# Patient Record
Sex: Female | Born: 1935 | Race: White | Hispanic: No | Smoking: Former smoker
Health system: Southern US, Community
[De-identification: ages and names within clinical notes are randomized; demographics above are authoritative.]

## PROBLEM LIST (undated history)

## (undated) DIAGNOSIS — R351 Nocturia: Secondary | ICD-10-CM

## (undated) DIAGNOSIS — M1990 Unspecified osteoarthritis, unspecified site: Secondary | ICD-10-CM

## (undated) DIAGNOSIS — N952 Postmenopausal atrophic vaginitis: Secondary | ICD-10-CM

## (undated) DIAGNOSIS — N6012 Diffuse cystic mastopathy of left breast: Secondary | ICD-10-CM

## (undated) DIAGNOSIS — R609 Edema, unspecified: Secondary | ICD-10-CM

## (undated) DIAGNOSIS — Z8719 Personal history of other diseases of the digestive system: Secondary | ICD-10-CM

## (undated) DIAGNOSIS — J45909 Unspecified asthma, uncomplicated: Secondary | ICD-10-CM

## (undated) DIAGNOSIS — G8929 Other chronic pain: Secondary | ICD-10-CM

## (undated) DIAGNOSIS — F329 Major depressive disorder, single episode, unspecified: Secondary | ICD-10-CM

## (undated) DIAGNOSIS — I739 Peripheral vascular disease, unspecified: Secondary | ICD-10-CM

## (undated) DIAGNOSIS — E785 Hyperlipidemia, unspecified: Secondary | ICD-10-CM

## (undated) DIAGNOSIS — F419 Anxiety disorder, unspecified: Secondary | ICD-10-CM

## (undated) DIAGNOSIS — I219 Acute myocardial infarction, unspecified: Secondary | ICD-10-CM

## (undated) DIAGNOSIS — N6011 Diffuse cystic mastopathy of right breast: Secondary | ICD-10-CM

## (undated) DIAGNOSIS — E039 Hypothyroidism, unspecified: Secondary | ICD-10-CM

## (undated) DIAGNOSIS — N200 Calculus of kidney: Secondary | ICD-10-CM

## (undated) DIAGNOSIS — G629 Polyneuropathy, unspecified: Secondary | ICD-10-CM

## (undated) DIAGNOSIS — F32A Depression, unspecified: Secondary | ICD-10-CM

## (undated) DIAGNOSIS — I252 Old myocardial infarction: Secondary | ICD-10-CM

## (undated) DIAGNOSIS — N189 Chronic kidney disease, unspecified: Secondary | ICD-10-CM

## (undated) DIAGNOSIS — R079 Chest pain, unspecified: Secondary | ICD-10-CM

## (undated) DIAGNOSIS — J449 Chronic obstructive pulmonary disease, unspecified: Secondary | ICD-10-CM

## (undated) DIAGNOSIS — L905 Scar conditions and fibrosis of skin: Secondary | ICD-10-CM

## (undated) DIAGNOSIS — N3289 Other specified disorders of bladder: Secondary | ICD-10-CM

## (undated) DIAGNOSIS — J9610 Chronic respiratory failure, unspecified whether with hypoxia or hypercapnia: Secondary | ICD-10-CM

## (undated) DIAGNOSIS — N183 Chronic kidney disease, stage 3 unspecified: Secondary | ICD-10-CM

## (undated) DIAGNOSIS — I10 Essential (primary) hypertension: Secondary | ICD-10-CM

## (undated) DIAGNOSIS — E7800 Pure hypercholesterolemia, unspecified: Secondary | ICD-10-CM

## (undated) DIAGNOSIS — E119 Type 2 diabetes mellitus without complications: Secondary | ICD-10-CM

## (undated) DIAGNOSIS — K219 Gastro-esophageal reflux disease without esophagitis: Secondary | ICD-10-CM

## (undated) DIAGNOSIS — D649 Anemia, unspecified: Secondary | ICD-10-CM

## (undated) DIAGNOSIS — I4891 Unspecified atrial fibrillation: Secondary | ICD-10-CM

## (undated) DIAGNOSIS — E1122 Type 2 diabetes mellitus with diabetic chronic kidney disease: Secondary | ICD-10-CM

## (undated) DIAGNOSIS — C801 Malignant (primary) neoplasm, unspecified: Secondary | ICD-10-CM

## (undated) DIAGNOSIS — E559 Vitamin D deficiency, unspecified: Secondary | ICD-10-CM

## (undated) DIAGNOSIS — I2510 Atherosclerotic heart disease of native coronary artery without angina pectoris: Secondary | ICD-10-CM

## (undated) DIAGNOSIS — I208 Other forms of angina pectoris: Secondary | ICD-10-CM

## (undated) HISTORY — DX: Diffuse cystic mastopathy of right breast: N60.11

## (undated) HISTORY — PX: BREAST SURGERY: SHX581

## (undated) HISTORY — DX: Major depressive disorder, single episode, unspecified: F32.9

## (undated) HISTORY — DX: Chest pain, unspecified: R07.9

## (undated) HISTORY — DX: Unspecified osteoarthritis, unspecified site: M19.90

## (undated) HISTORY — DX: Type 2 diabetes mellitus with diabetic chronic kidney disease: E11.22

## (undated) HISTORY — DX: Morbid (severe) obesity due to excess calories: E66.01

## (undated) HISTORY — PX: CATARACT EXTRACTION: SUR2

## (undated) HISTORY — DX: Personal history of other diseases of the digestive system: Z87.19

## (undated) HISTORY — DX: Vitamin D deficiency, unspecified: E55.9

## (undated) HISTORY — DX: Other specified disorders of bladder: N32.89

## (undated) HISTORY — DX: Diffuse cystic mastopathy of left breast: N60.12

## (undated) HISTORY — DX: Pure hypercholesterolemia, unspecified: E78.00

## (undated) HISTORY — DX: Type 2 diabetes mellitus without complications: E11.9

## (undated) HISTORY — DX: Other chronic pain: G89.29

## (undated) HISTORY — DX: Other forms of angina pectoris: I20.8

## (undated) HISTORY — DX: Gastro-esophageal reflux disease without esophagitis: K21.9

## (undated) HISTORY — DX: Unspecified asthma, uncomplicated: J45.909

## (undated) HISTORY — DX: Hypothyroidism, unspecified: E03.9

## (undated) HISTORY — DX: Essential (primary) hypertension: I10

## (undated) HISTORY — DX: Chronic respiratory failure, unspecified whether with hypoxia or hypercapnia: J96.10

## (undated) HISTORY — DX: Scar conditions and fibrosis of skin: L90.5

## (undated) HISTORY — DX: Old myocardial infarction: I25.2

## (undated) HISTORY — DX: Chronic kidney disease, stage 3 (moderate): N18.3

## (undated) HISTORY — PX: OTHER SURGICAL HISTORY: SHX169

## (undated) HISTORY — DX: Malignant (primary) neoplasm, unspecified: C80.1

## (undated) HISTORY — DX: Anxiety disorder, unspecified: F41.9

## (undated) HISTORY — DX: Edema, unspecified: R60.9

## (undated) HISTORY — DX: Depression, unspecified: F32.A

## (undated) HISTORY — DX: Acute myocardial infarction, unspecified: I21.9

## (undated) HISTORY — DX: Chronic obstructive pulmonary disease, unspecified: J44.9

## (undated) HISTORY — DX: Atherosclerotic heart disease of native coronary artery without angina pectoris: I25.10

## (undated) HISTORY — DX: Nocturia: R35.1

## (undated) HISTORY — DX: Hyperlipidemia, unspecified: E78.5

## (undated) HISTORY — DX: Unspecified atrial fibrillation: I48.91

## (undated) HISTORY — DX: Chronic kidney disease, stage 3 unspecified: N18.30

## (undated) HISTORY — DX: Postmenopausal atrophic vaginitis: N95.2

## (undated) HISTORY — DX: Anemia, unspecified: D64.9

## (undated) HISTORY — DX: Polyneuropathy, unspecified: G62.9

## (undated) HISTORY — DX: Peripheral vascular disease, unspecified: I73.9

## (undated) HISTORY — DX: Calculus of kidney: N20.0

## (undated) HISTORY — DX: Chronic kidney disease, unspecified: N18.9

---
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
## 1978-02-25 HISTORY — PX: CHOLECYSTECTOMY: SHX55

## 1983-02-26 HISTORY — PX: HERNIA REPAIR: SHX51

## 2000-02-26 HISTORY — PX: ARTHROPLASTY: SHX135

## 2000-05-10 ENCOUNTER — Inpatient Hospital Stay (HOSPITAL_COMMUNITY): Admission: EM | Admit: 2000-05-10 | Discharge: 2000-05-13 | Payer: Self-pay | Admitting: Emergency Medicine

## 2005-02-25 HISTORY — PX: CARPAL TUNNEL RELEASE: SHX101

## 2011-03-04 DIAGNOSIS — Z79899 Other long term (current) drug therapy: Secondary | ICD-10-CM | POA: Diagnosis not present

## 2011-03-04 DIAGNOSIS — E119 Type 2 diabetes mellitus without complications: Secondary | ICD-10-CM | POA: Diagnosis not present

## 2011-03-04 DIAGNOSIS — D510 Vitamin B12 deficiency anemia due to intrinsic factor deficiency: Secondary | ICD-10-CM | POA: Diagnosis not present

## 2011-03-04 DIAGNOSIS — E782 Mixed hyperlipidemia: Secondary | ICD-10-CM | POA: Diagnosis not present

## 2011-03-07 DIAGNOSIS — E1149 Type 2 diabetes mellitus with other diabetic neurological complication: Secondary | ICD-10-CM | POA: Diagnosis not present

## 2011-03-08 DIAGNOSIS — Z23 Encounter for immunization: Secondary | ICD-10-CM | POA: Diagnosis not present

## 2011-03-08 DIAGNOSIS — E1159 Type 2 diabetes mellitus with other circulatory complications: Secondary | ICD-10-CM | POA: Diagnosis not present

## 2011-03-08 DIAGNOSIS — Z780 Asymptomatic menopausal state: Secondary | ICD-10-CM | POA: Diagnosis not present

## 2011-03-08 DIAGNOSIS — M949 Disorder of cartilage, unspecified: Secondary | ICD-10-CM | POA: Diagnosis not present

## 2011-03-08 DIAGNOSIS — M899 Disorder of bone, unspecified: Secondary | ICD-10-CM | POA: Diagnosis not present

## 2011-03-11 DIAGNOSIS — J45909 Unspecified asthma, uncomplicated: Secondary | ICD-10-CM | POA: Diagnosis not present

## 2011-03-11 DIAGNOSIS — G4730 Sleep apnea, unspecified: Secondary | ICD-10-CM | POA: Diagnosis not present

## 2011-03-11 DIAGNOSIS — R5381 Other malaise: Secondary | ICD-10-CM | POA: Diagnosis not present

## 2011-03-11 DIAGNOSIS — J309 Allergic rhinitis, unspecified: Secondary | ICD-10-CM | POA: Diagnosis not present

## 2011-03-13 DIAGNOSIS — G4730 Sleep apnea, unspecified: Secondary | ICD-10-CM | POA: Diagnosis not present

## 2011-03-13 DIAGNOSIS — G4710 Hypersomnia, unspecified: Secondary | ICD-10-CM | POA: Diagnosis not present

## 2011-03-27 DIAGNOSIS — N200 Calculus of kidney: Secondary | ICD-10-CM | POA: Diagnosis not present

## 2011-03-27 DIAGNOSIS — N3090 Cystitis, unspecified without hematuria: Secondary | ICD-10-CM | POA: Diagnosis not present

## 2011-03-27 DIAGNOSIS — N318 Other neuromuscular dysfunction of bladder: Secondary | ICD-10-CM | POA: Diagnosis not present

## 2011-03-28 DIAGNOSIS — L570 Actinic keratosis: Secondary | ICD-10-CM | POA: Diagnosis not present

## 2011-03-28 DIAGNOSIS — D485 Neoplasm of uncertain behavior of skin: Secondary | ICD-10-CM | POA: Diagnosis not present

## 2011-04-02 DIAGNOSIS — Z09 Encounter for follow-up examination after completed treatment for conditions other than malignant neoplasm: Secondary | ICD-10-CM | POA: Diagnosis not present

## 2011-04-02 DIAGNOSIS — H26499 Other secondary cataract, unspecified eye: Secondary | ICD-10-CM | POA: Diagnosis not present

## 2011-04-18 DIAGNOSIS — H26499 Other secondary cataract, unspecified eye: Secondary | ICD-10-CM | POA: Diagnosis not present

## 2011-05-23 DIAGNOSIS — N3090 Cystitis, unspecified without hematuria: Secondary | ICD-10-CM | POA: Diagnosis not present

## 2011-05-23 DIAGNOSIS — N318 Other neuromuscular dysfunction of bladder: Secondary | ICD-10-CM | POA: Diagnosis not present

## 2011-05-23 DIAGNOSIS — N200 Calculus of kidney: Secondary | ICD-10-CM | POA: Diagnosis not present

## 2011-05-28 DIAGNOSIS — E782 Mixed hyperlipidemia: Secondary | ICD-10-CM | POA: Diagnosis not present

## 2011-05-28 DIAGNOSIS — E1159 Type 2 diabetes mellitus with other circulatory complications: Secondary | ICD-10-CM | POA: Diagnosis not present

## 2011-05-28 DIAGNOSIS — D510 Vitamin B12 deficiency anemia due to intrinsic factor deficiency: Secondary | ICD-10-CM | POA: Diagnosis not present

## 2011-05-28 DIAGNOSIS — Z79899 Other long term (current) drug therapy: Secondary | ICD-10-CM | POA: Diagnosis not present

## 2011-05-28 DIAGNOSIS — E038 Other specified hypothyroidism: Secondary | ICD-10-CM | POA: Diagnosis not present

## 2011-05-29 DIAGNOSIS — E038 Other specified hypothyroidism: Secondary | ICD-10-CM | POA: Diagnosis not present

## 2011-05-29 DIAGNOSIS — E1159 Type 2 diabetes mellitus with other circulatory complications: Secondary | ICD-10-CM | POA: Diagnosis not present

## 2011-06-05 DIAGNOSIS — Z1231 Encounter for screening mammogram for malignant neoplasm of breast: Secondary | ICD-10-CM | POA: Diagnosis not present

## 2011-06-10 DIAGNOSIS — J309 Allergic rhinitis, unspecified: Secondary | ICD-10-CM | POA: Diagnosis not present

## 2011-06-10 DIAGNOSIS — G4730 Sleep apnea, unspecified: Secondary | ICD-10-CM | POA: Diagnosis not present

## 2011-06-10 DIAGNOSIS — J45909 Unspecified asthma, uncomplicated: Secondary | ICD-10-CM | POA: Diagnosis not present

## 2011-06-11 DIAGNOSIS — J45909 Unspecified asthma, uncomplicated: Secondary | ICD-10-CM | POA: Diagnosis not present

## 2011-06-11 DIAGNOSIS — G4710 Hypersomnia, unspecified: Secondary | ICD-10-CM | POA: Diagnosis not present

## 2011-06-11 DIAGNOSIS — J309 Allergic rhinitis, unspecified: Secondary | ICD-10-CM | POA: Diagnosis not present

## 2011-06-17 DIAGNOSIS — E669 Obesity, unspecified: Secondary | ICD-10-CM | POA: Diagnosis not present

## 2011-06-17 DIAGNOSIS — N6019 Diffuse cystic mastopathy of unspecified breast: Secondary | ICD-10-CM | POA: Diagnosis not present

## 2011-06-25 DIAGNOSIS — G4730 Sleep apnea, unspecified: Secondary | ICD-10-CM | POA: Diagnosis not present

## 2011-06-25 DIAGNOSIS — G4710 Hypersomnia, unspecified: Secondary | ICD-10-CM | POA: Diagnosis not present

## 2011-07-04 DIAGNOSIS — E1149 Type 2 diabetes mellitus with other diabetic neurological complication: Secondary | ICD-10-CM | POA: Diagnosis not present

## 2011-07-04 DIAGNOSIS — Q828 Other specified congenital malformations of skin: Secondary | ICD-10-CM | POA: Diagnosis not present

## 2011-07-04 DIAGNOSIS — L608 Other nail disorders: Secondary | ICD-10-CM | POA: Diagnosis not present

## 2011-07-15 DIAGNOSIS — G4730 Sleep apnea, unspecified: Secondary | ICD-10-CM | POA: Diagnosis not present

## 2011-07-15 DIAGNOSIS — J309 Allergic rhinitis, unspecified: Secondary | ICD-10-CM | POA: Diagnosis not present

## 2011-07-15 DIAGNOSIS — R5383 Other fatigue: Secondary | ICD-10-CM | POA: Diagnosis not present

## 2011-07-15 DIAGNOSIS — J45909 Unspecified asthma, uncomplicated: Secondary | ICD-10-CM | POA: Diagnosis not present

## 2011-07-15 DIAGNOSIS — G4710 Hypersomnia, unspecified: Secondary | ICD-10-CM | POA: Diagnosis not present

## 2011-08-01 DIAGNOSIS — G4733 Obstructive sleep apnea (adult) (pediatric): Secondary | ICD-10-CM | POA: Diagnosis not present

## 2011-08-06 DIAGNOSIS — N390 Urinary tract infection, site not specified: Secondary | ICD-10-CM | POA: Diagnosis not present

## 2011-08-19 DIAGNOSIS — E119 Type 2 diabetes mellitus without complications: Secondary | ICD-10-CM | POA: Diagnosis not present

## 2011-08-19 DIAGNOSIS — F411 Generalized anxiety disorder: Secondary | ICD-10-CM | POA: Diagnosis not present

## 2011-08-20 DIAGNOSIS — Z79899 Other long term (current) drug therapy: Secondary | ICD-10-CM | POA: Diagnosis not present

## 2011-08-20 DIAGNOSIS — E782 Mixed hyperlipidemia: Secondary | ICD-10-CM | POA: Diagnosis not present

## 2011-09-03 DIAGNOSIS — R5381 Other malaise: Secondary | ICD-10-CM | POA: Diagnosis not present

## 2011-09-03 DIAGNOSIS — G4730 Sleep apnea, unspecified: Secondary | ICD-10-CM | POA: Diagnosis not present

## 2011-09-03 DIAGNOSIS — J309 Allergic rhinitis, unspecified: Secondary | ICD-10-CM | POA: Diagnosis not present

## 2011-09-03 DIAGNOSIS — J45909 Unspecified asthma, uncomplicated: Secondary | ICD-10-CM | POA: Diagnosis not present

## 2011-09-03 DIAGNOSIS — G4710 Hypersomnia, unspecified: Secondary | ICD-10-CM | POA: Diagnosis not present

## 2011-09-16 DIAGNOSIS — F411 Generalized anxiety disorder: Secondary | ICD-10-CM | POA: Diagnosis not present

## 2011-09-24 DIAGNOSIS — F411 Generalized anxiety disorder: Secondary | ICD-10-CM | POA: Diagnosis not present

## 2011-10-01 DIAGNOSIS — F411 Generalized anxiety disorder: Secondary | ICD-10-CM | POA: Diagnosis not present

## 2011-10-10 DIAGNOSIS — G4700 Insomnia, unspecified: Secondary | ICD-10-CM | POA: Diagnosis not present

## 2011-10-24 DIAGNOSIS — I69920 Aphasia following unspecified cerebrovascular disease: Secondary | ICD-10-CM | POA: Diagnosis not present

## 2011-10-24 DIAGNOSIS — G250 Essential tremor: Secondary | ICD-10-CM | POA: Diagnosis not present

## 2011-10-24 DIAGNOSIS — F411 Generalized anxiety disorder: Secondary | ICD-10-CM | POA: Diagnosis not present

## 2011-10-24 DIAGNOSIS — G252 Other specified forms of tremor: Secondary | ICD-10-CM | POA: Diagnosis not present

## 2011-10-29 DIAGNOSIS — I69920 Aphasia following unspecified cerebrovascular disease: Secondary | ICD-10-CM | POA: Diagnosis not present

## 2011-10-29 DIAGNOSIS — G252 Other specified forms of tremor: Secondary | ICD-10-CM | POA: Diagnosis not present

## 2011-10-29 DIAGNOSIS — G250 Essential tremor: Secondary | ICD-10-CM | POA: Diagnosis not present

## 2011-11-01 DIAGNOSIS — I69920 Aphasia following unspecified cerebrovascular disease: Secondary | ICD-10-CM | POA: Diagnosis not present

## 2011-11-01 DIAGNOSIS — G252 Other specified forms of tremor: Secondary | ICD-10-CM | POA: Diagnosis not present

## 2011-11-07 DIAGNOSIS — L608 Other nail disorders: Secondary | ICD-10-CM | POA: Diagnosis not present

## 2011-11-07 DIAGNOSIS — E1149 Type 2 diabetes mellitus with other diabetic neurological complication: Secondary | ICD-10-CM | POA: Diagnosis not present

## 2011-11-07 DIAGNOSIS — Q828 Other specified congenital malformations of skin: Secondary | ICD-10-CM | POA: Diagnosis not present

## 2011-11-13 DIAGNOSIS — E119 Type 2 diabetes mellitus without complications: Secondary | ICD-10-CM | POA: Diagnosis not present

## 2011-11-13 DIAGNOSIS — Z23 Encounter for immunization: Secondary | ICD-10-CM | POA: Diagnosis not present

## 2011-11-13 DIAGNOSIS — Z79899 Other long term (current) drug therapy: Secondary | ICD-10-CM | POA: Diagnosis not present

## 2011-11-13 DIAGNOSIS — N3000 Acute cystitis without hematuria: Secondary | ICD-10-CM | POA: Diagnosis not present

## 2011-11-14 DIAGNOSIS — M7989 Other specified soft tissue disorders: Secondary | ICD-10-CM | POA: Diagnosis not present

## 2011-12-13 DIAGNOSIS — M1710 Unilateral primary osteoarthritis, unspecified knee: Secondary | ICD-10-CM | POA: Diagnosis not present

## 2011-12-16 DIAGNOSIS — J310 Chronic rhinitis: Secondary | ICD-10-CM | POA: Diagnosis not present

## 2011-12-16 DIAGNOSIS — J45909 Unspecified asthma, uncomplicated: Secondary | ICD-10-CM | POA: Diagnosis not present

## 2011-12-16 DIAGNOSIS — G4710 Hypersomnia, unspecified: Secondary | ICD-10-CM | POA: Diagnosis not present

## 2011-12-16 DIAGNOSIS — R5383 Other fatigue: Secondary | ICD-10-CM | POA: Diagnosis not present

## 2011-12-16 DIAGNOSIS — R5381 Other malaise: Secondary | ICD-10-CM | POA: Diagnosis not present

## 2011-12-17 DIAGNOSIS — N390 Urinary tract infection, site not specified: Secondary | ICD-10-CM | POA: Diagnosis not present

## 2011-12-17 DIAGNOSIS — N952 Postmenopausal atrophic vaginitis: Secondary | ICD-10-CM | POA: Diagnosis not present

## 2011-12-17 DIAGNOSIS — N318 Other neuromuscular dysfunction of bladder: Secondary | ICD-10-CM | POA: Diagnosis not present

## 2011-12-17 DIAGNOSIS — G4730 Sleep apnea, unspecified: Secondary | ICD-10-CM | POA: Diagnosis not present

## 2011-12-17 DIAGNOSIS — G4710 Hypersomnia, unspecified: Secondary | ICD-10-CM | POA: Diagnosis not present

## 2011-12-18 DIAGNOSIS — I10 Essential (primary) hypertension: Secondary | ICD-10-CM | POA: Diagnosis not present

## 2011-12-18 DIAGNOSIS — E785 Hyperlipidemia, unspecified: Secondary | ICD-10-CM | POA: Diagnosis not present

## 2011-12-18 DIAGNOSIS — E669 Obesity, unspecified: Secondary | ICD-10-CM | POA: Diagnosis not present

## 2011-12-18 DIAGNOSIS — I2510 Atherosclerotic heart disease of native coronary artery without angina pectoris: Secondary | ICD-10-CM | POA: Diagnosis not present

## 2011-12-19 DIAGNOSIS — M79609 Pain in unspecified limb: Secondary | ICD-10-CM | POA: Diagnosis not present

## 2011-12-19 DIAGNOSIS — R609 Edema, unspecified: Secondary | ICD-10-CM | POA: Diagnosis not present

## 2011-12-23 DIAGNOSIS — I2510 Atherosclerotic heart disease of native coronary artery without angina pectoris: Secondary | ICD-10-CM | POA: Diagnosis not present

## 2011-12-23 DIAGNOSIS — Z01810 Encounter for preprocedural cardiovascular examination: Secondary | ICD-10-CM | POA: Diagnosis not present

## 2011-12-25 DIAGNOSIS — Z79899 Other long term (current) drug therapy: Secondary | ICD-10-CM | POA: Diagnosis not present

## 2011-12-25 DIAGNOSIS — D649 Anemia, unspecified: Secondary | ICD-10-CM | POA: Diagnosis not present

## 2011-12-25 DIAGNOSIS — Z7901 Long term (current) use of anticoagulants: Secondary | ICD-10-CM | POA: Diagnosis not present

## 2011-12-25 DIAGNOSIS — Z01818 Encounter for other preprocedural examination: Secondary | ICD-10-CM | POA: Diagnosis not present

## 2011-12-30 DIAGNOSIS — Z01818 Encounter for other preprocedural examination: Secondary | ICD-10-CM | POA: Diagnosis not present

## 2012-01-08 DIAGNOSIS — M1710 Unilateral primary osteoarthritis, unspecified knee: Secondary | ICD-10-CM | POA: Diagnosis not present

## 2012-01-14 DIAGNOSIS — I10 Essential (primary) hypertension: Secondary | ICD-10-CM | POA: Diagnosis not present

## 2012-01-14 DIAGNOSIS — Z01818 Encounter for other preprocedural examination: Secondary | ICD-10-CM | POA: Diagnosis not present

## 2012-01-14 DIAGNOSIS — E7800 Pure hypercholesterolemia, unspecified: Secondary | ICD-10-CM | POA: Diagnosis not present

## 2012-01-14 DIAGNOSIS — E119 Type 2 diabetes mellitus without complications: Secondary | ICD-10-CM | POA: Diagnosis not present

## 2012-01-14 DIAGNOSIS — I739 Peripheral vascular disease, unspecified: Secondary | ICD-10-CM | POA: Diagnosis not present

## 2012-01-14 DIAGNOSIS — I519 Heart disease, unspecified: Secondary | ICD-10-CM | POA: Diagnosis not present

## 2012-01-16 DIAGNOSIS — R5381 Other malaise: Secondary | ICD-10-CM | POA: Diagnosis not present

## 2012-01-16 DIAGNOSIS — G4730 Sleep apnea, unspecified: Secondary | ICD-10-CM | POA: Diagnosis not present

## 2012-01-16 DIAGNOSIS — G4710 Hypersomnia, unspecified: Secondary | ICD-10-CM | POA: Diagnosis not present

## 2012-01-16 DIAGNOSIS — J3089 Other allergic rhinitis: Secondary | ICD-10-CM | POA: Diagnosis not present

## 2012-01-16 DIAGNOSIS — R5383 Other fatigue: Secondary | ICD-10-CM | POA: Diagnosis not present

## 2012-01-16 DIAGNOSIS — J45909 Unspecified asthma, uncomplicated: Secondary | ICD-10-CM | POA: Diagnosis not present

## 2012-01-17 DIAGNOSIS — G4710 Hypersomnia, unspecified: Secondary | ICD-10-CM | POA: Diagnosis not present

## 2012-01-17 DIAGNOSIS — G4730 Sleep apnea, unspecified: Secondary | ICD-10-CM | POA: Diagnosis not present

## 2012-01-21 DIAGNOSIS — IMO0002 Reserved for concepts with insufficient information to code with codable children: Secondary | ICD-10-CM | POA: Diagnosis not present

## 2012-01-21 DIAGNOSIS — S83289A Other tear of lateral meniscus, current injury, unspecified knee, initial encounter: Secondary | ICD-10-CM | POA: Diagnosis not present

## 2012-01-21 DIAGNOSIS — M2340 Loose body in knee, unspecified knee: Secondary | ICD-10-CM | POA: Diagnosis not present

## 2012-01-21 DIAGNOSIS — Z01818 Encounter for other preprocedural examination: Secondary | ICD-10-CM | POA: Diagnosis not present

## 2012-01-21 DIAGNOSIS — M7120 Synovial cyst of popliteal space [Baker], unspecified knee: Secondary | ICD-10-CM | POA: Diagnosis not present

## 2012-01-21 DIAGNOSIS — X58XXXA Exposure to other specified factors, initial encounter: Secondary | ICD-10-CM | POA: Diagnosis not present

## 2012-01-21 DIAGNOSIS — M25569 Pain in unspecified knee: Secondary | ICD-10-CM | POA: Diagnosis not present

## 2012-01-21 DIAGNOSIS — M1710 Unilateral primary osteoarthritis, unspecified knee: Secondary | ICD-10-CM | POA: Diagnosis not present

## 2012-01-21 DIAGNOSIS — M25469 Effusion, unspecified knee: Secondary | ICD-10-CM | POA: Diagnosis not present

## 2012-01-28 DIAGNOSIS — I10 Essential (primary) hypertension: Secondary | ICD-10-CM | POA: Diagnosis not present

## 2012-01-28 DIAGNOSIS — E119 Type 2 diabetes mellitus without complications: Secondary | ICD-10-CM | POA: Diagnosis not present

## 2012-01-28 DIAGNOSIS — I739 Peripheral vascular disease, unspecified: Secondary | ICD-10-CM | POA: Diagnosis not present

## 2012-02-03 DIAGNOSIS — IMO0002 Reserved for concepts with insufficient information to code with codable children: Secondary | ICD-10-CM | POA: Diagnosis present

## 2012-02-03 DIAGNOSIS — I252 Old myocardial infarction: Secondary | ICD-10-CM | POA: Diagnosis not present

## 2012-02-03 DIAGNOSIS — G4730 Sleep apnea, unspecified: Secondary | ICD-10-CM | POA: Diagnosis present

## 2012-02-03 DIAGNOSIS — F329 Major depressive disorder, single episode, unspecified: Secondary | ICD-10-CM | POA: Diagnosis present

## 2012-02-03 DIAGNOSIS — I10 Essential (primary) hypertension: Secondary | ICD-10-CM | POA: Diagnosis not present

## 2012-02-03 DIAGNOSIS — D638 Anemia in other chronic diseases classified elsewhere: Secondary | ICD-10-CM | POA: Diagnosis not present

## 2012-02-03 DIAGNOSIS — E669 Obesity, unspecified: Secondary | ICD-10-CM | POA: Diagnosis present

## 2012-02-03 DIAGNOSIS — R262 Difficulty in walking, not elsewhere classified: Secondary | ICD-10-CM | POA: Diagnosis not present

## 2012-02-03 DIAGNOSIS — J449 Chronic obstructive pulmonary disease, unspecified: Secondary | ICD-10-CM | POA: Diagnosis not present

## 2012-02-03 DIAGNOSIS — E119 Type 2 diabetes mellitus without complications: Secondary | ICD-10-CM | POA: Diagnosis not present

## 2012-02-03 DIAGNOSIS — I2510 Atherosclerotic heart disease of native coronary artery without angina pectoris: Secondary | ICD-10-CM | POA: Diagnosis not present

## 2012-02-03 DIAGNOSIS — R5381 Other malaise: Secondary | ICD-10-CM | POA: Diagnosis not present

## 2012-02-03 DIAGNOSIS — M1710 Unilateral primary osteoarthritis, unspecified knee: Secondary | ICD-10-CM | POA: Diagnosis not present

## 2012-02-03 DIAGNOSIS — E1159 Type 2 diabetes mellitus with other circulatory complications: Secondary | ICD-10-CM | POA: Diagnosis not present

## 2012-02-03 DIAGNOSIS — E559 Vitamin D deficiency, unspecified: Secondary | ICD-10-CM | POA: Diagnosis not present

## 2012-02-03 DIAGNOSIS — F410 Panic disorder [episodic paroxysmal anxiety] without agoraphobia: Secondary | ICD-10-CM | POA: Diagnosis present

## 2012-02-03 DIAGNOSIS — E039 Hypothyroidism, unspecified: Secondary | ICD-10-CM | POA: Diagnosis not present

## 2012-02-03 DIAGNOSIS — Z79899 Other long term (current) drug therapy: Secondary | ICD-10-CM | POA: Diagnosis not present

## 2012-02-03 DIAGNOSIS — N189 Chronic kidney disease, unspecified: Secondary | ICD-10-CM | POA: Diagnosis not present

## 2012-02-03 DIAGNOSIS — J4489 Other specified chronic obstructive pulmonary disease: Secondary | ICD-10-CM | POA: Diagnosis not present

## 2012-02-03 DIAGNOSIS — E785 Hyperlipidemia, unspecified: Secondary | ICD-10-CM | POA: Diagnosis not present

## 2012-02-03 DIAGNOSIS — Z471 Aftercare following joint replacement surgery: Secondary | ICD-10-CM | POA: Diagnosis not present

## 2012-02-03 DIAGNOSIS — M6281 Muscle weakness (generalized): Secondary | ICD-10-CM | POA: Diagnosis not present

## 2012-02-03 DIAGNOSIS — J9620 Acute and chronic respiratory failure, unspecified whether with hypoxia or hypercapnia: Secondary | ICD-10-CM | POA: Diagnosis not present

## 2012-02-03 DIAGNOSIS — K219 Gastro-esophageal reflux disease without esophagitis: Secondary | ICD-10-CM | POA: Diagnosis not present

## 2012-02-03 DIAGNOSIS — N179 Acute kidney failure, unspecified: Secondary | ICD-10-CM | POA: Diagnosis not present

## 2012-02-03 DIAGNOSIS — N039 Chronic nephritic syndrome with unspecified morphologic changes: Secondary | ICD-10-CM | POA: Diagnosis not present

## 2012-02-03 DIAGNOSIS — I119 Hypertensive heart disease without heart failure: Secondary | ICD-10-CM | POA: Diagnosis not present

## 2012-02-03 DIAGNOSIS — I959 Hypotension, unspecified: Secondary | ICD-10-CM | POA: Diagnosis not present

## 2012-02-03 DIAGNOSIS — G8918 Other acute postprocedural pain: Secondary | ICD-10-CM | POA: Diagnosis not present

## 2012-02-03 DIAGNOSIS — R9431 Abnormal electrocardiogram [ECG] [EKG]: Secondary | ICD-10-CM | POA: Diagnosis not present

## 2012-02-03 DIAGNOSIS — M159 Polyosteoarthritis, unspecified: Secondary | ICD-10-CM | POA: Diagnosis not present

## 2012-02-03 DIAGNOSIS — G609 Hereditary and idiopathic neuropathy, unspecified: Secondary | ICD-10-CM | POA: Diagnosis present

## 2012-02-03 DIAGNOSIS — Z5189 Encounter for other specified aftercare: Secondary | ICD-10-CM | POA: Diagnosis not present

## 2012-02-03 HISTORY — PX: REPLACEMENT TOTAL KNEE: SUR1224

## 2012-02-04 DIAGNOSIS — R9431 Abnormal electrocardiogram [ECG] [EKG]: Secondary | ICD-10-CM | POA: Diagnosis not present

## 2012-02-06 DIAGNOSIS — I2510 Atherosclerotic heart disease of native coronary artery without angina pectoris: Secondary | ICD-10-CM | POA: Diagnosis not present

## 2012-02-06 DIAGNOSIS — J9620 Acute and chronic respiratory failure, unspecified whether with hypoxia or hypercapnia: Secondary | ICD-10-CM | POA: Diagnosis not present

## 2012-02-06 DIAGNOSIS — I10 Essential (primary) hypertension: Secondary | ICD-10-CM | POA: Diagnosis not present

## 2012-02-06 DIAGNOSIS — I130 Hypertensive heart and chronic kidney disease with heart failure and stage 1 through stage 4 chronic kidney disease, or unspecified chronic kidney disease: Secondary | ICD-10-CM | POA: Diagnosis not present

## 2012-02-06 DIAGNOSIS — D638 Anemia in other chronic diseases classified elsewhere: Secondary | ICD-10-CM | POA: Diagnosis not present

## 2012-02-06 DIAGNOSIS — R5381 Other malaise: Secondary | ICD-10-CM | POA: Diagnosis not present

## 2012-02-06 DIAGNOSIS — R262 Difficulty in walking, not elsewhere classified: Secondary | ICD-10-CM | POA: Diagnosis not present

## 2012-02-06 DIAGNOSIS — Z471 Aftercare following joint replacement surgery: Secondary | ICD-10-CM | POA: Diagnosis not present

## 2012-02-06 DIAGNOSIS — E785 Hyperlipidemia, unspecified: Secondary | ICD-10-CM | POA: Diagnosis not present

## 2012-02-06 DIAGNOSIS — M6281 Muscle weakness (generalized): Secondary | ICD-10-CM | POA: Diagnosis not present

## 2012-02-06 DIAGNOSIS — K219 Gastro-esophageal reflux disease without esophagitis: Secondary | ICD-10-CM | POA: Diagnosis not present

## 2012-02-06 DIAGNOSIS — E039 Hypothyroidism, unspecified: Secondary | ICD-10-CM | POA: Diagnosis not present

## 2012-02-06 DIAGNOSIS — E119 Type 2 diabetes mellitus without complications: Secondary | ICD-10-CM | POA: Diagnosis not present

## 2012-02-06 DIAGNOSIS — N189 Chronic kidney disease, unspecified: Secondary | ICD-10-CM | POA: Diagnosis not present

## 2012-02-06 DIAGNOSIS — Z5189 Encounter for other specified aftercare: Secondary | ICD-10-CM | POA: Diagnosis not present

## 2012-02-06 DIAGNOSIS — M159 Polyosteoarthritis, unspecified: Secondary | ICD-10-CM | POA: Diagnosis not present

## 2012-02-27 DIAGNOSIS — E1149 Type 2 diabetes mellitus with other diabetic neurological complication: Secondary | ICD-10-CM | POA: Diagnosis not present

## 2012-02-27 DIAGNOSIS — L608 Other nail disorders: Secondary | ICD-10-CM | POA: Diagnosis not present

## 2012-02-29 DIAGNOSIS — Z471 Aftercare following joint replacement surgery: Secondary | ICD-10-CM | POA: Diagnosis not present

## 2012-02-29 DIAGNOSIS — Z96659 Presence of unspecified artificial knee joint: Secondary | ICD-10-CM | POA: Diagnosis not present

## 2012-03-02 DIAGNOSIS — Z471 Aftercare following joint replacement surgery: Secondary | ICD-10-CM | POA: Diagnosis not present

## 2012-03-02 DIAGNOSIS — Z96659 Presence of unspecified artificial knee joint: Secondary | ICD-10-CM | POA: Diagnosis not present

## 2012-03-03 DIAGNOSIS — Z96659 Presence of unspecified artificial knee joint: Secondary | ICD-10-CM | POA: Diagnosis not present

## 2012-03-03 DIAGNOSIS — Z471 Aftercare following joint replacement surgery: Secondary | ICD-10-CM | POA: Diagnosis not present

## 2012-03-05 DIAGNOSIS — Z471 Aftercare following joint replacement surgery: Secondary | ICD-10-CM | POA: Diagnosis not present

## 2012-03-05 DIAGNOSIS — Z96659 Presence of unspecified artificial knee joint: Secondary | ICD-10-CM | POA: Diagnosis not present

## 2012-03-09 DIAGNOSIS — Z79899 Other long term (current) drug therapy: Secondary | ICD-10-CM | POA: Diagnosis not present

## 2012-03-09 DIAGNOSIS — E119 Type 2 diabetes mellitus without complications: Secondary | ICD-10-CM | POA: Diagnosis not present

## 2012-03-09 DIAGNOSIS — R5381 Other malaise: Secondary | ICD-10-CM | POA: Diagnosis not present

## 2012-03-09 DIAGNOSIS — R5383 Other fatigue: Secondary | ICD-10-CM | POA: Diagnosis not present

## 2012-03-09 DIAGNOSIS — E876 Hypokalemia: Secondary | ICD-10-CM | POA: Diagnosis not present

## 2012-03-10 DIAGNOSIS — Z471 Aftercare following joint replacement surgery: Secondary | ICD-10-CM | POA: Diagnosis not present

## 2012-03-10 DIAGNOSIS — Z96659 Presence of unspecified artificial knee joint: Secondary | ICD-10-CM | POA: Diagnosis not present

## 2012-03-11 DIAGNOSIS — G4730 Sleep apnea, unspecified: Secondary | ICD-10-CM | POA: Diagnosis not present

## 2012-03-11 DIAGNOSIS — R5383 Other fatigue: Secondary | ICD-10-CM | POA: Diagnosis not present

## 2012-03-11 DIAGNOSIS — J45909 Unspecified asthma, uncomplicated: Secondary | ICD-10-CM | POA: Diagnosis not present

## 2012-03-11 DIAGNOSIS — J310 Chronic rhinitis: Secondary | ICD-10-CM | POA: Diagnosis not present

## 2012-03-11 DIAGNOSIS — G4710 Hypersomnia, unspecified: Secondary | ICD-10-CM | POA: Diagnosis not present

## 2012-03-12 DIAGNOSIS — G4730 Sleep apnea, unspecified: Secondary | ICD-10-CM | POA: Diagnosis not present

## 2012-03-12 DIAGNOSIS — G4710 Hypersomnia, unspecified: Secondary | ICD-10-CM | POA: Diagnosis not present

## 2012-03-14 DIAGNOSIS — Z96659 Presence of unspecified artificial knee joint: Secondary | ICD-10-CM | POA: Diagnosis not present

## 2012-03-14 DIAGNOSIS — Z471 Aftercare following joint replacement surgery: Secondary | ICD-10-CM | POA: Diagnosis not present

## 2012-03-16 DIAGNOSIS — Z96659 Presence of unspecified artificial knee joint: Secondary | ICD-10-CM | POA: Diagnosis not present

## 2012-03-18 DIAGNOSIS — Z96659 Presence of unspecified artificial knee joint: Secondary | ICD-10-CM | POA: Diagnosis not present

## 2012-03-18 DIAGNOSIS — N3090 Cystitis, unspecified without hematuria: Secondary | ICD-10-CM | POA: Diagnosis not present

## 2012-03-18 DIAGNOSIS — N201 Calculus of ureter: Secondary | ICD-10-CM | POA: Diagnosis not present

## 2012-03-20 DIAGNOSIS — Z96659 Presence of unspecified artificial knee joint: Secondary | ICD-10-CM | POA: Diagnosis not present

## 2012-03-23 DIAGNOSIS — Z96659 Presence of unspecified artificial knee joint: Secondary | ICD-10-CM | POA: Diagnosis not present

## 2012-03-27 DIAGNOSIS — Z96659 Presence of unspecified artificial knee joint: Secondary | ICD-10-CM | POA: Diagnosis not present

## 2012-03-30 DIAGNOSIS — Z96659 Presence of unspecified artificial knee joint: Secondary | ICD-10-CM | POA: Diagnosis not present

## 2012-04-01 DIAGNOSIS — Z96659 Presence of unspecified artificial knee joint: Secondary | ICD-10-CM | POA: Diagnosis not present

## 2012-04-03 DIAGNOSIS — Z96659 Presence of unspecified artificial knee joint: Secondary | ICD-10-CM | POA: Diagnosis not present

## 2012-04-06 DIAGNOSIS — Z96659 Presence of unspecified artificial knee joint: Secondary | ICD-10-CM | POA: Diagnosis not present

## 2012-04-10 DIAGNOSIS — Z96659 Presence of unspecified artificial knee joint: Secondary | ICD-10-CM | POA: Diagnosis not present

## 2012-04-10 DIAGNOSIS — Z471 Aftercare following joint replacement surgery: Secondary | ICD-10-CM | POA: Diagnosis not present

## 2012-04-13 DIAGNOSIS — Z96659 Presence of unspecified artificial knee joint: Secondary | ICD-10-CM | POA: Diagnosis not present

## 2012-05-18 DIAGNOSIS — N390 Urinary tract infection, site not specified: Secondary | ICD-10-CM | POA: Diagnosis not present

## 2012-05-18 DIAGNOSIS — N318 Other neuromuscular dysfunction of bladder: Secondary | ICD-10-CM | POA: Diagnosis not present

## 2012-06-04 DIAGNOSIS — L608 Other nail disorders: Secondary | ICD-10-CM | POA: Diagnosis not present

## 2012-06-04 DIAGNOSIS — E1149 Type 2 diabetes mellitus with other diabetic neurological complication: Secondary | ICD-10-CM | POA: Diagnosis not present

## 2012-06-05 DIAGNOSIS — Z1231 Encounter for screening mammogram for malignant neoplasm of breast: Secondary | ICD-10-CM | POA: Diagnosis not present

## 2012-06-08 DIAGNOSIS — N3090 Cystitis, unspecified without hematuria: Secondary | ICD-10-CM | POA: Diagnosis not present

## 2012-06-08 DIAGNOSIS — N3289 Other specified disorders of bladder: Secondary | ICD-10-CM | POA: Diagnosis not present

## 2012-06-08 DIAGNOSIS — N318 Other neuromuscular dysfunction of bladder: Secondary | ICD-10-CM | POA: Diagnosis not present

## 2012-06-09 DIAGNOSIS — G4710 Hypersomnia, unspecified: Secondary | ICD-10-CM | POA: Diagnosis not present

## 2012-06-09 DIAGNOSIS — R0609 Other forms of dyspnea: Secondary | ICD-10-CM | POA: Diagnosis not present

## 2012-06-09 DIAGNOSIS — G4730 Sleep apnea, unspecified: Secondary | ICD-10-CM | POA: Diagnosis not present

## 2012-06-09 DIAGNOSIS — R609 Edema, unspecified: Secondary | ICD-10-CM | POA: Diagnosis not present

## 2012-06-09 DIAGNOSIS — J45909 Unspecified asthma, uncomplicated: Secondary | ICD-10-CM | POA: Diagnosis not present

## 2012-06-09 DIAGNOSIS — R5381 Other malaise: Secondary | ICD-10-CM | POA: Diagnosis not present

## 2012-06-09 DIAGNOSIS — Z006 Encounter for examination for normal comparison and control in clinical research program: Secondary | ICD-10-CM | POA: Diagnosis not present

## 2012-06-09 DIAGNOSIS — R5383 Other fatigue: Secondary | ICD-10-CM | POA: Diagnosis not present

## 2012-06-09 DIAGNOSIS — J310 Chronic rhinitis: Secondary | ICD-10-CM | POA: Diagnosis not present

## 2012-06-10 DIAGNOSIS — G4710 Hypersomnia, unspecified: Secondary | ICD-10-CM | POA: Diagnosis not present

## 2012-06-13 DIAGNOSIS — J029 Acute pharyngitis, unspecified: Secondary | ICD-10-CM | POA: Diagnosis not present

## 2012-06-18 DIAGNOSIS — N6019 Diffuse cystic mastopathy of unspecified breast: Secondary | ICD-10-CM | POA: Diagnosis not present

## 2012-06-18 DIAGNOSIS — R928 Other abnormal and inconclusive findings on diagnostic imaging of breast: Secondary | ICD-10-CM | POA: Diagnosis not present

## 2012-06-23 DIAGNOSIS — E669 Obesity, unspecified: Secondary | ICD-10-CM | POA: Diagnosis not present

## 2012-06-23 DIAGNOSIS — N6019 Diffuse cystic mastopathy of unspecified breast: Secondary | ICD-10-CM | POA: Diagnosis not present

## 2012-07-02 DIAGNOSIS — Z0000 Encounter for general adult medical examination without abnormal findings: Secondary | ICD-10-CM | POA: Diagnosis not present

## 2012-07-02 DIAGNOSIS — Z79899 Other long term (current) drug therapy: Secondary | ICD-10-CM | POA: Diagnosis not present

## 2012-07-02 DIAGNOSIS — E782 Mixed hyperlipidemia: Secondary | ICD-10-CM | POA: Diagnosis not present

## 2012-07-02 DIAGNOSIS — I10 Essential (primary) hypertension: Secondary | ICD-10-CM | POA: Diagnosis not present

## 2012-08-03 DIAGNOSIS — M25519 Pain in unspecified shoulder: Secondary | ICD-10-CM | POA: Diagnosis not present

## 2012-08-05 DIAGNOSIS — I10 Essential (primary) hypertension: Secondary | ICD-10-CM | POA: Diagnosis not present

## 2012-08-05 DIAGNOSIS — Z006 Encounter for examination for normal comparison and control in clinical research program: Secondary | ICD-10-CM | POA: Diagnosis not present

## 2012-08-05 DIAGNOSIS — M25519 Pain in unspecified shoulder: Secondary | ICD-10-CM | POA: Diagnosis not present

## 2012-08-05 DIAGNOSIS — E782 Mixed hyperlipidemia: Secondary | ICD-10-CM | POA: Diagnosis not present

## 2012-08-18 DIAGNOSIS — Z86010 Personal history of colonic polyps: Secondary | ICD-10-CM | POA: Diagnosis not present

## 2012-08-18 DIAGNOSIS — K5730 Diverticulosis of large intestine without perforation or abscess without bleeding: Secondary | ICD-10-CM | POA: Diagnosis not present

## 2012-09-04 DIAGNOSIS — E119 Type 2 diabetes mellitus without complications: Secondary | ICD-10-CM | POA: Diagnosis not present

## 2012-09-04 DIAGNOSIS — Z96659 Presence of unspecified artificial knee joint: Secondary | ICD-10-CM | POA: Diagnosis not present

## 2012-09-07 DIAGNOSIS — K5900 Constipation, unspecified: Secondary | ICD-10-CM | POA: Diagnosis not present

## 2012-09-07 DIAGNOSIS — N318 Other neuromuscular dysfunction of bladder: Secondary | ICD-10-CM | POA: Diagnosis not present

## 2012-10-05 DIAGNOSIS — G4730 Sleep apnea, unspecified: Secondary | ICD-10-CM | POA: Diagnosis not present

## 2012-10-05 DIAGNOSIS — R5383 Other fatigue: Secondary | ICD-10-CM | POA: Diagnosis not present

## 2012-10-05 DIAGNOSIS — J45909 Unspecified asthma, uncomplicated: Secondary | ICD-10-CM | POA: Diagnosis not present

## 2012-10-05 DIAGNOSIS — J3089 Other allergic rhinitis: Secondary | ICD-10-CM | POA: Diagnosis not present

## 2012-10-05 DIAGNOSIS — G4710 Hypersomnia, unspecified: Secondary | ICD-10-CM | POA: Diagnosis not present

## 2012-10-15 DIAGNOSIS — Q828 Other specified congenital malformations of skin: Secondary | ICD-10-CM | POA: Diagnosis not present

## 2012-10-15 DIAGNOSIS — L608 Other nail disorders: Secondary | ICD-10-CM | POA: Diagnosis not present

## 2012-10-15 DIAGNOSIS — E1149 Type 2 diabetes mellitus with other diabetic neurological complication: Secondary | ICD-10-CM | POA: Diagnosis not present

## 2012-11-09 DIAGNOSIS — J209 Acute bronchitis, unspecified: Secondary | ICD-10-CM | POA: Diagnosis not present

## 2012-11-09 DIAGNOSIS — J309 Allergic rhinitis, unspecified: Secondary | ICD-10-CM | POA: Diagnosis not present

## 2012-12-08 DIAGNOSIS — Z23 Encounter for immunization: Secondary | ICD-10-CM | POA: Diagnosis not present

## 2012-12-16 DIAGNOSIS — E119 Type 2 diabetes mellitus without complications: Secondary | ICD-10-CM | POA: Diagnosis not present

## 2012-12-16 DIAGNOSIS — R52 Pain, unspecified: Secondary | ICD-10-CM | POA: Diagnosis not present

## 2012-12-21 DIAGNOSIS — E782 Mixed hyperlipidemia: Secondary | ICD-10-CM | POA: Diagnosis not present

## 2012-12-21 DIAGNOSIS — Z79899 Other long term (current) drug therapy: Secondary | ICD-10-CM | POA: Diagnosis not present

## 2012-12-21 DIAGNOSIS — E039 Hypothyroidism, unspecified: Secondary | ICD-10-CM | POA: Diagnosis not present

## 2012-12-24 DIAGNOSIS — IMO0002 Reserved for concepts with insufficient information to code with codable children: Secondary | ICD-10-CM | POA: Diagnosis not present

## 2013-01-07 DIAGNOSIS — J45909 Unspecified asthma, uncomplicated: Secondary | ICD-10-CM | POA: Diagnosis not present

## 2013-01-07 DIAGNOSIS — G4710 Hypersomnia, unspecified: Secondary | ICD-10-CM | POA: Diagnosis not present

## 2013-01-07 DIAGNOSIS — R5381 Other malaise: Secondary | ICD-10-CM | POA: Diagnosis not present

## 2013-01-07 DIAGNOSIS — J310 Chronic rhinitis: Secondary | ICD-10-CM | POA: Diagnosis not present

## 2013-01-08 DIAGNOSIS — G4710 Hypersomnia, unspecified: Secondary | ICD-10-CM | POA: Diagnosis not present

## 2013-01-11 DIAGNOSIS — N200 Calculus of kidney: Secondary | ICD-10-CM | POA: Diagnosis not present

## 2013-01-11 DIAGNOSIS — N209 Urinary calculus, unspecified: Secondary | ICD-10-CM | POA: Diagnosis not present

## 2013-01-11 DIAGNOSIS — N952 Postmenopausal atrophic vaginitis: Secondary | ICD-10-CM | POA: Diagnosis not present

## 2013-01-14 ENCOUNTER — Ambulatory Visit (INDEPENDENT_AMBULATORY_CARE_PROVIDER_SITE_OTHER): Payer: Medicare Other

## 2013-01-14 ENCOUNTER — Encounter (INDEPENDENT_AMBULATORY_CARE_PROVIDER_SITE_OTHER): Payer: Self-pay

## 2013-01-14 VITALS — BP 128/71 | HR 86 | Resp 16

## 2013-01-14 DIAGNOSIS — M2040 Other hammer toe(s) (acquired), unspecified foot: Secondary | ICD-10-CM

## 2013-01-14 DIAGNOSIS — R109 Unspecified abdominal pain: Secondary | ICD-10-CM | POA: Diagnosis not present

## 2013-01-14 DIAGNOSIS — M1990 Unspecified osteoarthritis, unspecified site: Secondary | ICD-10-CM

## 2013-01-14 DIAGNOSIS — L608 Other nail disorders: Secondary | ICD-10-CM

## 2013-01-14 DIAGNOSIS — N200 Calculus of kidney: Secondary | ICD-10-CM | POA: Diagnosis not present

## 2013-01-14 DIAGNOSIS — Q828 Other specified congenital malformations of skin: Secondary | ICD-10-CM | POA: Diagnosis not present

## 2013-01-14 DIAGNOSIS — E1149 Type 2 diabetes mellitus with other diabetic neurological complication: Secondary | ICD-10-CM

## 2013-01-14 DIAGNOSIS — E1142 Type 2 diabetes mellitus with diabetic polyneuropathy: Secondary | ICD-10-CM

## 2013-01-14 DIAGNOSIS — E1140 Type 2 diabetes mellitus with diabetic neuropathy, unspecified: Secondary | ICD-10-CM

## 2013-01-14 NOTE — Patient Instructions (Signed)
Diabetes and Foot Care Diabetes may cause you to have problems because of poor blood supply (circulation) to your feet and legs. This may cause the skin on your feet to become thinner, break easier, and heal more slowly. Your skin may become dry, and the skin may peel and crack. You may also have nerve damage in your legs and feet causing decreased feeling in them. You may not notice minor injuries to your feet that could lead to infections or more serious problems. Taking care of your feet is one of the most important things you can do for yourself.  HOME CARE INSTRUCTIONS  Wear shoes at all times, even in the house. Do not go barefoot. Bare feet are easily injured.  Check your feet daily for blisters, cuts, and redness. If you cannot see the bottom of your feet, use a mirror or ask someone for help.  Wash your feet with warm water (do not use hot water) and mild soap. Then pat your feet and the areas between your toes until they are completely dry. Do not soak your feet as this can dry your skin.  Apply a moisturizing lotion or petroleum jelly (that does not contain alcohol and is unscented) to the skin on your feet and to dry, brittle toenails. Do not apply lotion between your toes.  Trim your toenails straight across. Do not dig under them or around the cuticle. File the edges of your nails with an emery board or nail file.  Do not cut corns or calluses or try to remove them with medicine.  Wear clean socks or stockings every day. Make sure they are not too tight. Do not wear knee-high stockings since they may decrease blood flow to your legs.  Wear shoes that fit properly and have enough cushioning. To break in new shoes, wear them for just a few hours a day. This prevents you from injuring your feet. Always look in your shoes before you put them on to be sure there are no objects inside.  Do not cross your legs. This may decrease the blood flow to your feet.  If you find a minor scrape,  cut, or break in the skin on your feet, keep it and the skin around it clean and dry. These areas may be cleansed with mild soap and water. Do not cleanse the area with peroxide, alcohol, or iodine.  When you remove an adhesive bandage, be sure not to damage the skin around it.  If you have a wound, look at it several times a day to make sure it is healing.  Do not use heating pads or hot water bottles. They may burn your skin. If you have lost feeling in your feet or legs, you may not know it is happening until it is too late.  Make sure your health care provider performs a complete foot exam at least annually or more often if you have foot problems. Report any cuts, sores, or bruises to your health care provider immediately. SEEK MEDICAL CARE IF:   You have an injury that is not healing.  You have cuts or breaks in the skin.  You have an ingrown nail.  You notice redness on your legs or feet.  You feel burning or tingling in your legs or feet.  You have pain or cramps in your legs and feet.  Your legs or feet are numb.  Your feet always feel cold. SEEK IMMEDIATE MEDICAL CARE IF:   There is increasing redness,   swelling, or pain in or around a wound.  There is a red line that goes up your leg.  Pus is coming from a wound.  You develop a fever or as directed by your health care provider.  You notice a bad smell coming from an ulcer or wound. Document Released: 02/09/2000 Document Revised: 10/14/2012 Document Reviewed: 07/21/2012 Exit[employer] Patient Information 2014 [employer].  

## 2013-01-14 NOTE — Progress Notes (Signed)
  Subjective:    Patient ID: Shelia Sullivan, female    DOB: 03/06/1931, 79 y.o.   MRN: [id number]  HPI Trim my nails patient also is a painful callus or lesion sub-fifth MTP area left foot. Was also requesting authorization for diabetic shoes in the future.   Review of Systems  Constitutional: Positive for appetite change.  HENT: Negative.   Eyes: Negative.   Respiratory: Negative.   Cardiovascular: Negative.   Gastrointestinal: Negative.   Endocrine: Negative.   Genitourinary: Negative.   Musculoskeletal: Positive for back pain.       Joint pain and muscle pain and difficulty walking  Allergic/Immunologic: Negative.   Hematological: Negative.   Psychiatric/Behavioral: Negative.        Objective:   Physical Exam Neurovascular status is intact as follows DP postal for bilateral PT plus one over 4 bilateral. Refill timed 3-4 seconds all digits skin temperature warm turgor normal no edema noted on the right there is + edema on the left patient also significant varicosities both feet. Neurologically epicritic and proprioceptive sensations diminished on Semmes Weinstein testing to the forefoot digits and arch bilateral. There is normal plantar response DTRs not elicited. 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 Patient does have keratoses sub-fifth left. Nails thick brittle friable criptotic 2 through 5 bilateral having a previous nail avulsions both hallux.     Assessment & Plan:  Assessment this time diabetes with peripheral neuropathy and likely angiopathy. Patient does have atrophy of skin multiple keratoses although sub-5 left is most significant at this time painful cement keratotic lesion and multiple nails 2 through 5 bilateral debrided and the presence of diabetes and complicating factors. Return for future  palliative care in 3 months obtain appropriate authorization for diabetic extra-depth shoes and custom molded dual density Plastizote inlays in the interim. To her primary physician. Patient ice to maintain a coming shoes currently wearing diabetic shoes as instructed no open wounds or ulcerations noted current time. Reappointed 3 months next  Alvan Dame DPM

## 2013-02-19 DIAGNOSIS — Z96659 Presence of unspecified artificial knee joint: Secondary | ICD-10-CM | POA: Diagnosis not present

## 2013-03-01 DIAGNOSIS — M25559 Pain in unspecified hip: Secondary | ICD-10-CM | POA: Diagnosis not present

## 2013-03-01 DIAGNOSIS — M25539 Pain in unspecified wrist: Secondary | ICD-10-CM | POA: Diagnosis not present

## 2013-03-01 DIAGNOSIS — M25519 Pain in unspecified shoulder: Secondary | ICD-10-CM | POA: Diagnosis not present

## 2013-03-01 DIAGNOSIS — M79609 Pain in unspecified limb: Secondary | ICD-10-CM | POA: Diagnosis not present

## 2013-03-03 DIAGNOSIS — R5383 Other fatigue: Secondary | ICD-10-CM | POA: Diagnosis not present

## 2013-03-03 DIAGNOSIS — J45909 Unspecified asthma, uncomplicated: Secondary | ICD-10-CM | POA: Diagnosis not present

## 2013-03-03 DIAGNOSIS — G4710 Hypersomnia, unspecified: Secondary | ICD-10-CM | POA: Diagnosis not present

## 2013-03-03 DIAGNOSIS — J310 Chronic rhinitis: Secondary | ICD-10-CM | POA: Diagnosis not present

## 2013-03-03 DIAGNOSIS — R5381 Other malaise: Secondary | ICD-10-CM | POA: Diagnosis not present

## 2013-03-04 DIAGNOSIS — G4710 Hypersomnia, unspecified: Secondary | ICD-10-CM | POA: Diagnosis not present

## 2013-03-04 DIAGNOSIS — G4730 Sleep apnea, unspecified: Secondary | ICD-10-CM | POA: Diagnosis not present

## 2013-03-23 DIAGNOSIS — E119 Type 2 diabetes mellitus without complications: Secondary | ICD-10-CM | POA: Diagnosis not present

## 2013-03-29 DIAGNOSIS — E119 Type 2 diabetes mellitus without complications: Secondary | ICD-10-CM | POA: Diagnosis not present

## 2013-04-15 ENCOUNTER — Ambulatory Visit: Payer: Medicare Other

## 2013-04-21 DIAGNOSIS — M25819 Other specified joint disorders, unspecified shoulder: Secondary | ICD-10-CM | POA: Diagnosis not present

## 2013-04-21 DIAGNOSIS — B309 Viral conjunctivitis, unspecified: Secondary | ICD-10-CM | POA: Diagnosis not present

## 2013-04-21 DIAGNOSIS — M25519 Pain in unspecified shoulder: Secondary | ICD-10-CM | POA: Diagnosis not present

## 2013-04-22 DIAGNOSIS — S0990XA Unspecified injury of head, initial encounter: Secondary | ICD-10-CM | POA: Diagnosis not present

## 2013-04-22 DIAGNOSIS — R51 Headache: Secondary | ICD-10-CM | POA: Diagnosis not present

## 2013-04-22 DIAGNOSIS — S0993XA Unspecified injury of face, initial encounter: Secondary | ICD-10-CM | POA: Diagnosis not present

## 2013-04-22 DIAGNOSIS — S199XXA Unspecified injury of neck, initial encounter: Secondary | ICD-10-CM | POA: Diagnosis not present

## 2013-04-27 DIAGNOSIS — M25519 Pain in unspecified shoulder: Secondary | ICD-10-CM | POA: Diagnosis not present

## 2013-04-27 DIAGNOSIS — E119 Type 2 diabetes mellitus without complications: Secondary | ICD-10-CM | POA: Diagnosis not present

## 2013-04-27 DIAGNOSIS — M25619 Stiffness of unspecified shoulder, not elsewhere classified: Secondary | ICD-10-CM | POA: Diagnosis not present

## 2013-04-29 DIAGNOSIS — M25619 Stiffness of unspecified shoulder, not elsewhere classified: Secondary | ICD-10-CM | POA: Diagnosis not present

## 2013-04-29 DIAGNOSIS — M25519 Pain in unspecified shoulder: Secondary | ICD-10-CM | POA: Diagnosis not present

## 2013-05-03 DIAGNOSIS — E119 Type 2 diabetes mellitus without complications: Secondary | ICD-10-CM | POA: Diagnosis not present

## 2013-05-04 DIAGNOSIS — M25619 Stiffness of unspecified shoulder, not elsewhere classified: Secondary | ICD-10-CM | POA: Diagnosis not present

## 2013-05-04 DIAGNOSIS — M25519 Pain in unspecified shoulder: Secondary | ICD-10-CM | POA: Diagnosis not present

## 2013-05-05 DIAGNOSIS — G4730 Sleep apnea, unspecified: Secondary | ICD-10-CM | POA: Diagnosis not present

## 2013-05-05 DIAGNOSIS — J310 Chronic rhinitis: Secondary | ICD-10-CM | POA: Diagnosis not present

## 2013-05-05 DIAGNOSIS — J45909 Unspecified asthma, uncomplicated: Secondary | ICD-10-CM | POA: Diagnosis not present

## 2013-05-05 DIAGNOSIS — R5383 Other fatigue: Secondary | ICD-10-CM | POA: Diagnosis not present

## 2013-05-05 DIAGNOSIS — R5381 Other malaise: Secondary | ICD-10-CM | POA: Diagnosis not present

## 2013-05-05 DIAGNOSIS — G4710 Hypersomnia, unspecified: Secondary | ICD-10-CM | POA: Diagnosis not present

## 2013-05-06 ENCOUNTER — Ambulatory Visit (INDEPENDENT_AMBULATORY_CARE_PROVIDER_SITE_OTHER): Payer: Medicare Other

## 2013-05-06 VITALS — BP 119/66 | HR 84 | Resp 18

## 2013-05-06 DIAGNOSIS — L608 Other nail disorders: Secondary | ICD-10-CM

## 2013-05-06 DIAGNOSIS — E1149 Type 2 diabetes mellitus with other diabetic neurological complication: Secondary | ICD-10-CM

## 2013-05-06 DIAGNOSIS — E1140 Type 2 diabetes mellitus with diabetic neuropathy, unspecified: Secondary | ICD-10-CM

## 2013-05-06 DIAGNOSIS — M25619 Stiffness of unspecified shoulder, not elsewhere classified: Secondary | ICD-10-CM | POA: Diagnosis not present

## 2013-05-06 DIAGNOSIS — M25519 Pain in unspecified shoulder: Secondary | ICD-10-CM | POA: Diagnosis not present

## 2013-05-06 DIAGNOSIS — M1990 Unspecified osteoarthritis, unspecified site: Secondary | ICD-10-CM

## 2013-05-06 DIAGNOSIS — Q828 Other specified congenital malformations of skin: Secondary | ICD-10-CM

## 2013-05-06 NOTE — Patient Instructions (Signed)
Diabetes and Foot Care Diabetes may cause you to have problems because of poor blood supply (circulation) to your feet and legs. This may cause the skin on your feet to become thinner, break easier, and heal more slowly. Your skin may become dry, and the skin may peel and crack. You may also have nerve damage in your legs and feet causing decreased feeling in them. You may not notice minor injuries to your feet that could lead to infections or more serious problems. Taking care of your feet is one of the most important things you can do for yourself.  HOME CARE INSTRUCTIONS  Wear shoes at all times, even in the house. Do not go barefoot. Bare feet are easily injured.  Check your feet daily for blisters, cuts, and redness. If you cannot see the bottom of your feet, use a mirror or ask someone for help.  Wash your feet with warm water (do not use hot water) and mild soap. Then pat your feet and the areas between your toes until they are completely dry. Do not soak your feet as this can dry your skin.  Apply a moisturizing lotion or petroleum jelly (that does not contain alcohol and is unscented) to the skin on your feet and to dry, brittle toenails. Do not apply lotion between your toes.  Trim your toenails straight across. Do not dig under them or around the cuticle. File the edges of your nails with an emery board or nail file.  Do not cut corns or calluses or try to remove them with medicine.  Wear clean socks or stockings every day. Make sure they are not too tight. Do not wear knee-high stockings since they may decrease blood flow to your legs.  Wear shoes that fit properly and have enough cushioning. To break in new shoes, wear them for just a few hours a day. This prevents you from injuring your feet. Always look in your shoes before you put them on to be sure there are no objects inside.  Do not cross your legs. This may decrease the blood flow to your feet.  If you find a minor scrape,  cut, or break in the skin on your feet, keep it and the skin around it clean and dry. These areas may be cleansed with mild soap and water. Do not cleanse the area with peroxide, alcohol, or iodine.  When you remove an adhesive bandage, be sure not to damage the skin around it.  If you have a wound, look at it several times a day to make sure it is healing.  Do not use heating pads or hot water bottles. They may burn your skin. If you have lost feeling in your feet or legs, you may not know it is happening until it is too late.  Make sure your health care provider performs a complete foot exam at least annually or more often if you have foot problems. Report any cuts, sores, or bruises to your health care provider immediately. SEEK MEDICAL CARE IF:   You have an injury that is not healing.  You have cuts or breaks in the skin.  You have an ingrown nail.  You notice redness on your legs or feet.  You feel burning or tingling in your legs or feet.  You have pain or cramps in your legs and feet.  Your legs or feet are numb.  Your feet always feel cold. SEEK IMMEDIATE MEDICAL CARE IF:   There is increasing redness,   swelling, or pain in or around a wound.  There is a red line that goes up your leg.  Pus is coming from a wound.  You develop a fever or as directed by your health care provider.  You notice a bad smell coming from an ulcer or wound. Document Released: 02/09/2000 Document Revised: 10/14/2012 Document Reviewed: 07/21/2012 Exit[employer] Patient Information 2014 [employer].  

## 2013-05-06 NOTE — Progress Notes (Signed)
   Subjective:    Patient ID: Shelia Sullivan, female    DOB: 09/24/1931, 80 y.o.   MRN: [id number]  HPI I am here to get my toenails trimmed up     Review of Systems no new changes or findings     Objective:   Physical Exam Vascular status is intact DP +2/4 bilateral Refill timed 3-4 seconds PT one over 4 bilateral epicritic and proprioceptive sensations intact although diminished on Semmes Weinstein testing to distal digits and plantar forefoot arch. Orthopedic biomechanical exam reveals mild digital contractures with associated keratoses patient is an and HAV deformity and keratosis or pinch callus and clotted contractures of digits nails thick brittle friable gratified x10       Assessment & Plan:  Diabetes with peripheral neuropathy. Dystrophic from criptotic nails debrided x10 the presence of diabetes and complications patient is a candidate for diabetic accident shoes history of keratoses secondary digital contractures Earl shoes are worn need replacing at this time we'll obtain authorization from Shelia Sullivan for new diabetic shoes and custom molded dual density Plastizote inlays followup with in 3 months for continued palliative nail care contact patient once authorization for shoes obtained next  Shelia Sullivan DPM

## 2013-05-11 DIAGNOSIS — M25519 Pain in unspecified shoulder: Secondary | ICD-10-CM | POA: Diagnosis not present

## 2013-05-11 DIAGNOSIS — M25619 Stiffness of unspecified shoulder, not elsewhere classified: Secondary | ICD-10-CM | POA: Diagnosis not present

## 2013-05-13 DIAGNOSIS — M25619 Stiffness of unspecified shoulder, not elsewhere classified: Secondary | ICD-10-CM | POA: Diagnosis not present

## 2013-05-13 DIAGNOSIS — M25519 Pain in unspecified shoulder: Secondary | ICD-10-CM | POA: Diagnosis not present

## 2013-05-17 DIAGNOSIS — I10 Essential (primary) hypertension: Secondary | ICD-10-CM | POA: Diagnosis not present

## 2013-05-17 DIAGNOSIS — Z79899 Other long term (current) drug therapy: Secondary | ICD-10-CM | POA: Diagnosis not present

## 2013-05-17 DIAGNOSIS — E785 Hyperlipidemia, unspecified: Secondary | ICD-10-CM | POA: Diagnosis not present

## 2013-05-17 DIAGNOSIS — E119 Type 2 diabetes mellitus without complications: Secondary | ICD-10-CM | POA: Diagnosis not present

## 2013-05-17 DIAGNOSIS — I2510 Atherosclerotic heart disease of native coronary artery without angina pectoris: Secondary | ICD-10-CM | POA: Diagnosis not present

## 2013-05-17 DIAGNOSIS — E669 Obesity, unspecified: Secondary | ICD-10-CM | POA: Diagnosis not present

## 2013-05-17 DIAGNOSIS — G4730 Sleep apnea, unspecified: Secondary | ICD-10-CM | POA: Diagnosis not present

## 2013-05-18 DIAGNOSIS — M25619 Stiffness of unspecified shoulder, not elsewhere classified: Secondary | ICD-10-CM | POA: Diagnosis not present

## 2013-05-18 DIAGNOSIS — M25519 Pain in unspecified shoulder: Secondary | ICD-10-CM | POA: Diagnosis not present

## 2013-05-20 DIAGNOSIS — I2510 Atherosclerotic heart disease of native coronary artery without angina pectoris: Secondary | ICD-10-CM | POA: Diagnosis not present

## 2013-05-24 DIAGNOSIS — M25619 Stiffness of unspecified shoulder, not elsewhere classified: Secondary | ICD-10-CM | POA: Diagnosis not present

## 2013-05-24 DIAGNOSIS — M25519 Pain in unspecified shoulder: Secondary | ICD-10-CM | POA: Diagnosis not present

## 2013-05-26 DIAGNOSIS — M25819 Other specified joint disorders, unspecified shoulder: Secondary | ICD-10-CM | POA: Diagnosis not present

## 2013-05-26 DIAGNOSIS — M25519 Pain in unspecified shoulder: Secondary | ICD-10-CM | POA: Diagnosis not present

## 2013-05-26 DIAGNOSIS — M66329 Spontaneous rupture of flexor tendons, unspecified upper arm: Secondary | ICD-10-CM | POA: Diagnosis not present

## 2013-06-04 DIAGNOSIS — I2510 Atherosclerotic heart disease of native coronary artery without angina pectoris: Secondary | ICD-10-CM | POA: Diagnosis not present

## 2013-06-07 DIAGNOSIS — Z1231 Encounter for screening mammogram for malignant neoplasm of breast: Secondary | ICD-10-CM | POA: Diagnosis not present

## 2013-06-11 DIAGNOSIS — R002 Palpitations: Secondary | ICD-10-CM | POA: Diagnosis not present

## 2013-06-14 DIAGNOSIS — E669 Obesity, unspecified: Secondary | ICD-10-CM | POA: Diagnosis not present

## 2013-06-14 DIAGNOSIS — I2510 Atherosclerotic heart disease of native coronary artery without angina pectoris: Secondary | ICD-10-CM | POA: Diagnosis not present

## 2013-06-14 DIAGNOSIS — G4730 Sleep apnea, unspecified: Secondary | ICD-10-CM | POA: Diagnosis not present

## 2013-06-14 DIAGNOSIS — I10 Essential (primary) hypertension: Secondary | ICD-10-CM | POA: Diagnosis not present

## 2013-06-14 DIAGNOSIS — E119 Type 2 diabetes mellitus without complications: Secondary | ICD-10-CM | POA: Diagnosis not present

## 2013-06-14 DIAGNOSIS — E785 Hyperlipidemia, unspecified: Secondary | ICD-10-CM | POA: Diagnosis not present

## 2013-06-16 DIAGNOSIS — R002 Palpitations: Secondary | ICD-10-CM | POA: Diagnosis not present

## 2013-06-21 DIAGNOSIS — E669 Obesity, unspecified: Secondary | ICD-10-CM | POA: Diagnosis not present

## 2013-06-21 DIAGNOSIS — Z6834 Body mass index (BMI) 34.0-34.9, adult: Secondary | ICD-10-CM | POA: Diagnosis not present

## 2013-06-21 DIAGNOSIS — N6019 Diffuse cystic mastopathy of unspecified breast: Secondary | ICD-10-CM | POA: Diagnosis not present

## 2013-07-12 DIAGNOSIS — N952 Postmenopausal atrophic vaginitis: Secondary | ICD-10-CM | POA: Diagnosis not present

## 2013-07-12 DIAGNOSIS — N318 Other neuromuscular dysfunction of bladder: Secondary | ICD-10-CM | POA: Diagnosis not present

## 2013-07-12 DIAGNOSIS — N3090 Cystitis, unspecified without hematuria: Secondary | ICD-10-CM | POA: Diagnosis not present

## 2013-07-22 DIAGNOSIS — M25559 Pain in unspecified hip: Secondary | ICD-10-CM | POA: Diagnosis not present

## 2013-07-22 DIAGNOSIS — S199XXA Unspecified injury of neck, initial encounter: Secondary | ICD-10-CM | POA: Diagnosis not present

## 2013-07-22 DIAGNOSIS — S59909A Unspecified injury of unspecified elbow, initial encounter: Secondary | ICD-10-CM | POA: Diagnosis not present

## 2013-07-22 DIAGNOSIS — S0990XA Unspecified injury of head, initial encounter: Secondary | ICD-10-CM | POA: Diagnosis not present

## 2013-07-22 DIAGNOSIS — S0993XA Unspecified injury of face, initial encounter: Secondary | ICD-10-CM | POA: Diagnosis not present

## 2013-07-22 DIAGNOSIS — R9431 Abnormal electrocardiogram [ECG] [EKG]: Secondary | ICD-10-CM | POA: Diagnosis not present

## 2013-07-22 DIAGNOSIS — S79919A Unspecified injury of unspecified hip, initial encounter: Secondary | ICD-10-CM | POA: Diagnosis not present

## 2013-07-22 DIAGNOSIS — M25529 Pain in unspecified elbow: Secondary | ICD-10-CM | POA: Diagnosis not present

## 2013-07-22 DIAGNOSIS — W010XXA Fall on same level from slipping, tripping and stumbling without subsequent striking against object, initial encounter: Secondary | ICD-10-CM | POA: Diagnosis not present

## 2013-07-22 DIAGNOSIS — R51 Headache: Secondary | ICD-10-CM | POA: Diagnosis not present

## 2013-07-22 DIAGNOSIS — S79929A Unspecified injury of unspecified thigh, initial encounter: Secondary | ICD-10-CM | POA: Diagnosis not present

## 2013-07-23 DIAGNOSIS — M25559 Pain in unspecified hip: Secondary | ICD-10-CM | POA: Diagnosis not present

## 2013-07-23 DIAGNOSIS — M25529 Pain in unspecified elbow: Secondary | ICD-10-CM | POA: Diagnosis not present

## 2013-07-23 DIAGNOSIS — S79919A Unspecified injury of unspecified hip, initial encounter: Secondary | ICD-10-CM | POA: Diagnosis not present

## 2013-07-23 DIAGNOSIS — S59909A Unspecified injury of unspecified elbow, initial encounter: Secondary | ICD-10-CM | POA: Diagnosis not present

## 2013-07-26 DIAGNOSIS — M25819 Other specified joint disorders, unspecified shoulder: Secondary | ICD-10-CM | POA: Diagnosis not present

## 2013-07-26 DIAGNOSIS — M66329 Spontaneous rupture of flexor tendons, unspecified upper arm: Secondary | ICD-10-CM | POA: Diagnosis not present

## 2013-07-26 DIAGNOSIS — Z79899 Other long term (current) drug therapy: Secondary | ICD-10-CM | POA: Diagnosis not present

## 2013-07-26 DIAGNOSIS — IMO0001 Reserved for inherently not codable concepts without codable children: Secondary | ICD-10-CM | POA: Diagnosis not present

## 2013-07-26 DIAGNOSIS — E782 Mixed hyperlipidemia: Secondary | ICD-10-CM | POA: Diagnosis not present

## 2013-07-26 DIAGNOSIS — M25519 Pain in unspecified shoulder: Secondary | ICD-10-CM | POA: Diagnosis not present

## 2013-08-02 DIAGNOSIS — Z0000 Encounter for general adult medical examination without abnormal findings: Secondary | ICD-10-CM | POA: Diagnosis not present

## 2013-08-02 DIAGNOSIS — IMO0001 Reserved for inherently not codable concepts without codable children: Secondary | ICD-10-CM | POA: Diagnosis not present

## 2013-08-04 DIAGNOSIS — R209 Unspecified disturbances of skin sensation: Secondary | ICD-10-CM | POA: Diagnosis not present

## 2013-08-04 DIAGNOSIS — M6281 Muscle weakness (generalized): Secondary | ICD-10-CM | POA: Diagnosis not present

## 2013-08-04 DIAGNOSIS — Z9181 History of falling: Secondary | ICD-10-CM | POA: Diagnosis not present

## 2013-08-04 DIAGNOSIS — R269 Unspecified abnormalities of gait and mobility: Secondary | ICD-10-CM | POA: Diagnosis not present

## 2013-08-04 DIAGNOSIS — IMO0001 Reserved for inherently not codable concepts without codable children: Secondary | ICD-10-CM | POA: Diagnosis not present

## 2013-08-04 DIAGNOSIS — I69998 Other sequelae following unspecified cerebrovascular disease: Secondary | ICD-10-CM | POA: Diagnosis not present

## 2013-08-10 DIAGNOSIS — M6281 Muscle weakness (generalized): Secondary | ICD-10-CM | POA: Diagnosis not present

## 2013-08-10 DIAGNOSIS — I69998 Other sequelae following unspecified cerebrovascular disease: Secondary | ICD-10-CM | POA: Diagnosis not present

## 2013-08-10 DIAGNOSIS — R269 Unspecified abnormalities of gait and mobility: Secondary | ICD-10-CM | POA: Diagnosis not present

## 2013-08-10 DIAGNOSIS — IMO0001 Reserved for inherently not codable concepts without codable children: Secondary | ICD-10-CM | POA: Diagnosis not present

## 2013-08-10 DIAGNOSIS — Z9181 History of falling: Secondary | ICD-10-CM | POA: Diagnosis not present

## 2013-08-12 ENCOUNTER — Ambulatory Visit (INDEPENDENT_AMBULATORY_CARE_PROVIDER_SITE_OTHER): Payer: Medicare Other

## 2013-08-12 VITALS — BP 117/59 | HR 79 | Resp 18

## 2013-08-12 DIAGNOSIS — L608 Other nail disorders: Secondary | ICD-10-CM | POA: Diagnosis not present

## 2013-08-12 DIAGNOSIS — R269 Unspecified abnormalities of gait and mobility: Secondary | ICD-10-CM | POA: Diagnosis not present

## 2013-08-12 DIAGNOSIS — E1149 Type 2 diabetes mellitus with other diabetic neurological complication: Secondary | ICD-10-CM

## 2013-08-12 DIAGNOSIS — IMO0001 Reserved for inherently not codable concepts without codable children: Secondary | ICD-10-CM | POA: Diagnosis not present

## 2013-08-12 DIAGNOSIS — Q828 Other specified congenital malformations of skin: Secondary | ICD-10-CM

## 2013-08-12 DIAGNOSIS — I69998 Other sequelae following unspecified cerebrovascular disease: Secondary | ICD-10-CM | POA: Diagnosis not present

## 2013-08-12 DIAGNOSIS — M6281 Muscle weakness (generalized): Secondary | ICD-10-CM | POA: Diagnosis not present

## 2013-08-12 DIAGNOSIS — M1990 Unspecified osteoarthritis, unspecified site: Secondary | ICD-10-CM

## 2013-08-12 DIAGNOSIS — E1140 Type 2 diabetes mellitus with diabetic neuropathy, unspecified: Secondary | ICD-10-CM

## 2013-08-12 DIAGNOSIS — Z9181 History of falling: Secondary | ICD-10-CM | POA: Diagnosis not present

## 2013-08-12 NOTE — Patient Instructions (Signed)
Diabetes and Foot Care Diabetes may cause you to have problems because of poor blood supply (circulation) to your feet and legs. This may cause the skin on your feet to become thinner, break easier, and heal more slowly. Your skin may become dry, and the skin may peel and crack. You may also have nerve damage in your legs and feet causing decreased feeling in them. You may not notice minor injuries to your feet that could lead to infections or more serious problems. Taking care of your feet is one of the most important things you can do for yourself.  HOME CARE INSTRUCTIONS  Wear shoes at all times, even in the house. Do not go barefoot. Bare feet are easily injured.  Check your feet daily for blisters, cuts, and redness. If you cannot see the bottom of your feet, use a mirror or ask someone for help.  Wash your feet with warm water (do not use hot water) and mild soap. Then pat your feet and the areas between your toes until they are completely dry. Do not soak your feet as this can dry your skin.  Apply a moisturizing lotion or petroleum jelly (that does not contain alcohol and is unscented) to the skin on your feet and to dry, brittle toenails. Do not apply lotion between your toes.  Trim your toenails straight across. Do not dig under them or around the cuticle. File the edges of your nails with an emery board or nail file.  Do not cut corns or calluses or try to remove them with medicine.  Wear clean socks or stockings every day. Make sure they are not too tight. Do not wear knee-high stockings since they may decrease blood flow to your legs.  Wear shoes that fit properly and have enough cushioning. To break in new shoes, wear them for just a few hours a day. This prevents you from injuring your feet. Always look in your shoes before you put them on to be sure there are no objects inside.  Do not cross your legs. This may decrease the blood flow to your feet.  If you find a minor scrape,  cut, or break in the skin on your feet, keep it and the skin around it clean and dry. These areas may be cleansed with mild soap and water. Do not cleanse the area with peroxide, alcohol, or iodine.  When you remove an adhesive bandage, be sure not to damage the skin around it.  If you have a wound, look at it several times a day to make sure it is healing.  Do not use heating pads or hot water bottles. They may burn your skin. If you have lost feeling in your feet or legs, you may not know it is happening until it is too late.  Make sure your health care provider performs a complete foot exam at least annually or more often if you have foot problems. Report any cuts, sores, or bruises to your health care provider immediately. SEEK MEDICAL CARE IF:   You have an injury that is not healing.  You have cuts or breaks in the skin.  You have an ingrown nail.  You notice redness on your legs or feet.  You feel burning or tingling in your legs or feet.  You have pain or cramps in your legs and feet.  Your legs or feet are numb.  Your feet always feel cold. SEEK IMMEDIATE MEDICAL CARE IF:   There is increasing redness,   swelling, or pain in or around a wound.  There is a red line that goes up your leg.  Pus is coming from a wound.  You develop a fever or as directed by your health care provider.  You notice a bad smell coming from an ulcer or wound. Document Released: 02/09/2000 Document Revised: 10/14/2012 Document Reviewed: 07/21/2012 Exit[employer] Patient Information 2015 [employer]. This information is not intended to replace advice given to you by your health care provider. Make sure you discuss any questions you have with your health care provider.  

## 2013-08-12 NOTE — Progress Notes (Signed)
   Subjective:    Patient ID: Shelia Sullivan, female    DOB: 1931/11/22, 80 y.o.   MRN: [id number]  HPI I AM HERE TO GET MY TOENAILS TRIMMED UP AND MY SHOES ARE READY    Review of Systems no new findings or systemic changes noted    Objective:   Physical Exam Neurovascular status is intact and unchanged pedal pulses palpable DP and PT plus one over 4 capillary refill timed 3-4 seconds all digits epicritic sensations intact although decreased the toes plantar arch and foot patient is also year for diabetic shoe pick up however the shoes that are fitted are too long and too wide for her foot patient does have rigid digital contractures however requiring any stretch type shoe Will order a different model shoe for her and we contacted when those ready for fitting and dispensing within the next week or 2. This time nails thick brittle dystrophic criptotic incurvated 2 through 5 bilateral patient had both hallux nails previously avulsed. No secondary infections nails thick brittle yellow discolored also distal clavus third digits bilateral show adductovarus rotation with distal clavus and hemorrhage a keratoses distal lateral aspect of the digits these are debrided at this time no active wounds or ulcerations however lumicain and Neosporin is applied to the third digit right following the debridement.       Assessment & Plan:  Assessment diabetes with complications including peripheral neuropathy and rigid digital contractures and nail dystrophy. This time nails debrided 2 through 5 bilateral also debridement keratoses distal clavus third toes bilateral reappointed 3 months for continued palliative diabetic foot and nail care will be called with the next week or 2 when diabetic shoes are reorder did ready for fitting and dispensing next  [name]

## 2013-08-17 DIAGNOSIS — IMO0001 Reserved for inherently not codable concepts without codable children: Secondary | ICD-10-CM | POA: Diagnosis not present

## 2013-08-17 DIAGNOSIS — I69998 Other sequelae following unspecified cerebrovascular disease: Secondary | ICD-10-CM | POA: Diagnosis not present

## 2013-08-17 DIAGNOSIS — Z9181 History of falling: Secondary | ICD-10-CM | POA: Diagnosis not present

## 2013-08-17 DIAGNOSIS — R269 Unspecified abnormalities of gait and mobility: Secondary | ICD-10-CM | POA: Diagnosis not present

## 2013-08-17 DIAGNOSIS — M6281 Muscle weakness (generalized): Secondary | ICD-10-CM | POA: Diagnosis not present

## 2013-08-23 DIAGNOSIS — J3089 Other allergic rhinitis: Secondary | ICD-10-CM | POA: Diagnosis not present

## 2013-08-23 DIAGNOSIS — G4710 Hypersomnia, unspecified: Secondary | ICD-10-CM | POA: Diagnosis not present

## 2013-08-23 DIAGNOSIS — R5381 Other malaise: Secondary | ICD-10-CM | POA: Diagnosis not present

## 2013-08-23 DIAGNOSIS — J45909 Unspecified asthma, uncomplicated: Secondary | ICD-10-CM | POA: Diagnosis not present

## 2013-08-23 DIAGNOSIS — G4730 Sleep apnea, unspecified: Secondary | ICD-10-CM | POA: Diagnosis not present

## 2013-08-23 DIAGNOSIS — R5383 Other fatigue: Secondary | ICD-10-CM | POA: Diagnosis not present

## 2013-08-25 DIAGNOSIS — J189 Pneumonia, unspecified organism: Secondary | ICD-10-CM | POA: Diagnosis not present

## 2013-08-25 DIAGNOSIS — K219 Gastro-esophageal reflux disease without esophagitis: Secondary | ICD-10-CM | POA: Diagnosis present

## 2013-08-25 DIAGNOSIS — E1159 Type 2 diabetes mellitus with other circulatory complications: Secondary | ICD-10-CM | POA: Diagnosis not present

## 2013-08-25 DIAGNOSIS — I2510 Atherosclerotic heart disease of native coronary artery without angina pectoris: Secondary | ICD-10-CM | POA: Diagnosis present

## 2013-08-25 DIAGNOSIS — I10 Essential (primary) hypertension: Secondary | ICD-10-CM | POA: Diagnosis not present

## 2013-08-25 DIAGNOSIS — R579 Shock, unspecified: Secondary | ICD-10-CM | POA: Diagnosis not present

## 2013-08-25 DIAGNOSIS — J159 Unspecified bacterial pneumonia: Secondary | ICD-10-CM | POA: Diagnosis not present

## 2013-08-25 DIAGNOSIS — E039 Hypothyroidism, unspecified: Secondary | ICD-10-CM | POA: Diagnosis present

## 2013-08-25 DIAGNOSIS — R6510 Systemic inflammatory response syndrome (SIRS) of non-infectious origin without acute organ dysfunction: Secondary | ICD-10-CM | POA: Diagnosis not present

## 2013-08-25 DIAGNOSIS — J9620 Acute and chronic respiratory failure, unspecified whether with hypoxia or hypercapnia: Secondary | ICD-10-CM | POA: Diagnosis not present

## 2013-08-25 DIAGNOSIS — R918 Other nonspecific abnormal finding of lung field: Secondary | ICD-10-CM | POA: Diagnosis not present

## 2013-08-25 DIAGNOSIS — R110 Nausea: Secondary | ICD-10-CM | POA: Diagnosis not present

## 2013-08-25 DIAGNOSIS — I252 Old myocardial infarction: Secondary | ICD-10-CM | POA: Diagnosis not present

## 2013-08-25 DIAGNOSIS — E119 Type 2 diabetes mellitus without complications: Secondary | ICD-10-CM | POA: Diagnosis not present

## 2013-08-25 DIAGNOSIS — Z79899 Other long term (current) drug therapy: Secondary | ICD-10-CM | POA: Diagnosis not present

## 2013-08-25 DIAGNOSIS — Z794 Long term (current) use of insulin: Secondary | ICD-10-CM | POA: Diagnosis not present

## 2013-08-25 DIAGNOSIS — E785 Hyperlipidemia, unspecified: Secondary | ICD-10-CM | POA: Diagnosis present

## 2013-08-25 DIAGNOSIS — Z6841 Body Mass Index (BMI) 40.0 and over, adult: Secondary | ICD-10-CM | POA: Diagnosis not present

## 2013-08-25 DIAGNOSIS — Z9889 Other specified postprocedural states: Secondary | ICD-10-CM | POA: Diagnosis not present

## 2013-08-25 DIAGNOSIS — J13 Pneumonia due to Streptococcus pneumoniae: Secondary | ICD-10-CM | POA: Diagnosis not present

## 2013-08-25 DIAGNOSIS — R0609 Other forms of dyspnea: Secondary | ICD-10-CM | POA: Diagnosis not present

## 2013-08-25 DIAGNOSIS — Z713 Dietary counseling and surveillance: Secondary | ICD-10-CM | POA: Diagnosis not present

## 2013-08-25 DIAGNOSIS — E876 Hypokalemia: Secondary | ICD-10-CM | POA: Diagnosis present

## 2013-08-25 DIAGNOSIS — R509 Fever, unspecified: Secondary | ICD-10-CM | POA: Diagnosis not present

## 2013-08-25 DIAGNOSIS — J449 Chronic obstructive pulmonary disease, unspecified: Secondary | ICD-10-CM | POA: Diagnosis not present

## 2013-08-25 DIAGNOSIS — J9600 Acute respiratory failure, unspecified whether with hypoxia or hypercapnia: Secondary | ICD-10-CM | POA: Diagnosis not present

## 2013-08-25 DIAGNOSIS — D649 Anemia, unspecified: Secondary | ICD-10-CM | POA: Diagnosis present

## 2013-08-25 DIAGNOSIS — A419 Sepsis, unspecified organism: Secondary | ICD-10-CM | POA: Diagnosis not present

## 2013-08-25 DIAGNOSIS — J441 Chronic obstructive pulmonary disease with (acute) exacerbation: Secondary | ICD-10-CM | POA: Diagnosis not present

## 2013-08-25 DIAGNOSIS — E7800 Pure hypercholesterolemia, unspecified: Secondary | ICD-10-CM | POA: Diagnosis not present

## 2013-08-25 DIAGNOSIS — G4733 Obstructive sleep apnea (adult) (pediatric): Secondary | ICD-10-CM | POA: Diagnosis present

## 2013-09-01 ENCOUNTER — Inpatient Hospital Stay
Admission: AD | Admit: 2013-09-01 | Discharge: 2013-09-21 | Disposition: A | Discharge status: Skilled Nursing Facility | Payer: Self-pay | Source: Ambulatory Visit | Attending: Internal Medicine | Admitting: Internal Medicine

## 2013-09-01 DIAGNOSIS — F411 Generalized anxiety disorder: Secondary | ICD-10-CM | POA: Diagnosis present

## 2013-09-01 DIAGNOSIS — E039 Hypothyroidism, unspecified: Secondary | ICD-10-CM | POA: Diagnosis present

## 2013-09-01 DIAGNOSIS — J9819 Other pulmonary collapse: Secondary | ICD-10-CM | POA: Diagnosis not present

## 2013-09-01 DIAGNOSIS — G4700 Insomnia, unspecified: Secondary | ICD-10-CM | POA: Diagnosis not present

## 2013-09-01 DIAGNOSIS — Z9981 Dependence on supplemental oxygen: Secondary | ICD-10-CM | POA: Diagnosis not present

## 2013-09-01 DIAGNOSIS — F329 Major depressive disorder, single episode, unspecified: Secondary | ICD-10-CM | POA: Diagnosis present

## 2013-09-01 DIAGNOSIS — Z5189 Encounter for other specified aftercare: Secondary | ICD-10-CM | POA: Diagnosis not present

## 2013-09-01 DIAGNOSIS — R1310 Dysphagia, unspecified: Secondary | ICD-10-CM | POA: Diagnosis present

## 2013-09-01 DIAGNOSIS — I2510 Atherosclerotic heart disease of native coronary artery without angina pectoris: Secondary | ICD-10-CM | POA: Diagnosis not present

## 2013-09-01 DIAGNOSIS — R6889 Other general symptoms and signs: Secondary | ICD-10-CM | POA: Diagnosis not present

## 2013-09-01 DIAGNOSIS — G4733 Obstructive sleep apnea (adult) (pediatric): Secondary | ICD-10-CM | POA: Diagnosis present

## 2013-09-01 DIAGNOSIS — J129 Viral pneumonia, unspecified: Secondary | ICD-10-CM | POA: Diagnosis not present

## 2013-09-01 DIAGNOSIS — B009 Herpesviral infection, unspecified: Secondary | ICD-10-CM | POA: Diagnosis not present

## 2013-09-01 DIAGNOSIS — E1159 Type 2 diabetes mellitus with other circulatory complications: Secondary | ICD-10-CM | POA: Diagnosis not present

## 2013-09-01 DIAGNOSIS — J9620 Acute and chronic respiratory failure, unspecified whether with hypoxia or hypercapnia: Secondary | ICD-10-CM | POA: Diagnosis not present

## 2013-09-01 DIAGNOSIS — J441 Chronic obstructive pulmonary disease with (acute) exacerbation: Secondary | ICD-10-CM | POA: Diagnosis not present

## 2013-09-01 DIAGNOSIS — J159 Unspecified bacterial pneumonia: Secondary | ICD-10-CM | POA: Diagnosis not present

## 2013-09-01 DIAGNOSIS — IMO0001 Reserved for inherently not codable concepts without codable children: Secondary | ICD-10-CM | POA: Diagnosis present

## 2013-09-01 DIAGNOSIS — I10 Essential (primary) hypertension: Secondary | ICD-10-CM | POA: Diagnosis present

## 2013-09-01 DIAGNOSIS — R933 Abnormal findings on diagnostic imaging of other parts of digestive tract: Secondary | ICD-10-CM | POA: Diagnosis not present

## 2013-09-01 DIAGNOSIS — J9600 Acute respiratory failure, unspecified whether with hypoxia or hypercapnia: Secondary | ICD-10-CM | POA: Diagnosis not present

## 2013-09-01 DIAGNOSIS — E119 Type 2 diabetes mellitus without complications: Secondary | ICD-10-CM | POA: Diagnosis not present

## 2013-09-01 DIAGNOSIS — F3289 Other specified depressive episodes: Secondary | ICD-10-CM | POA: Diagnosis present

## 2013-09-01 DIAGNOSIS — G589 Mononeuropathy, unspecified: Secondary | ICD-10-CM | POA: Diagnosis present

## 2013-09-01 DIAGNOSIS — E46 Unspecified protein-calorie malnutrition: Secondary | ICD-10-CM | POA: Diagnosis present

## 2013-09-01 DIAGNOSIS — J811 Chronic pulmonary edema: Secondary | ICD-10-CM | POA: Diagnosis not present

## 2013-09-01 DIAGNOSIS — J158 Pneumonia due to other specified bacteria: Secondary | ICD-10-CM | POA: Diagnosis not present

## 2013-09-01 DIAGNOSIS — J90 Pleural effusion, not elsewhere classified: Secondary | ICD-10-CM | POA: Diagnosis not present

## 2013-09-01 DIAGNOSIS — M6281 Muscle weakness (generalized): Secondary | ICD-10-CM | POA: Diagnosis not present

## 2013-09-01 DIAGNOSIS — R5381 Other malaise: Secondary | ICD-10-CM | POA: Diagnosis present

## 2013-09-01 DIAGNOSIS — J189 Pneumonia, unspecified organism: Secondary | ICD-10-CM | POA: Diagnosis not present

## 2013-09-01 DIAGNOSIS — A419 Sepsis, unspecified organism: Secondary | ICD-10-CM | POA: Diagnosis not present

## 2013-09-01 DIAGNOSIS — R1313 Dysphagia, pharyngeal phase: Secondary | ICD-10-CM | POA: Diagnosis not present

## 2013-09-01 DIAGNOSIS — E1142 Type 2 diabetes mellitus with diabetic polyneuropathy: Secondary | ICD-10-CM | POA: Diagnosis not present

## 2013-09-01 LAB — URINALYSIS, ROUTINE W REFLEX MICROSCOPIC
Bilirubin Urine: NEGATIVE
Glucose, UA: NEGATIVE mg/dL
Hgb urine dipstick: NEGATIVE
Ketones, ur: NEGATIVE mg/dL
Leukocytes, UA: NEGATIVE
Nitrite: NEGATIVE
Protein, ur: NEGATIVE mg/dL
Specific Gravity, Urine: 1.016 (ref 1.005–1.030)
Urobilinogen, UA: 1 mg/dL (ref 0.0–1.0)
pH: 8 (ref 5.0–8.0)

## 2013-09-02 ENCOUNTER — Other Ambulatory Visit (HOSPITAL_COMMUNITY): Payer: Self-pay

## 2013-09-02 DIAGNOSIS — J9819 Other pulmonary collapse: Secondary | ICD-10-CM | POA: Diagnosis not present

## 2013-09-02 DIAGNOSIS — J811 Chronic pulmonary edema: Secondary | ICD-10-CM | POA: Diagnosis not present

## 2013-09-02 DIAGNOSIS — J9600 Acute respiratory failure, unspecified whether with hypoxia or hypercapnia: Secondary | ICD-10-CM | POA: Diagnosis not present

## 2013-09-02 LAB — PHOSPHORUS: Phosphorus: 3.5 mg/dL (ref 2.3–4.6)

## 2013-09-02 LAB — URINE CULTURE
Colony Count: NO GROWTH
Culture: NO GROWTH

## 2013-09-02 LAB — BLOOD GAS, ARTERIAL
ACID-BASE EXCESS: 7 mmol/L — AB (ref 0.0–2.0)
BICARBONATE: 30.8 meq/L — AB (ref 20.0–24.0)
O2 Content: 3 L/min
O2 SAT: 95.1 %
PATIENT TEMPERATURE: 98.6
PH ART: 7.479 — AB (ref 7.350–7.450)
TCO2: 32.1 mmol/L (ref 0–100)
pCO2 arterial: 41.9 mmHg (ref 35.0–45.0)
pO2, Arterial: 75.1 mmHg — ABNORMAL LOW (ref 80.0–100.0)

## 2013-09-02 LAB — VITAMIN B12: Vitamin B-12: 651 pg/mL (ref 211–911)

## 2013-09-02 LAB — CBC WITH DIFFERENTIAL/PLATELET
BASOS ABS: 0 10*3/uL (ref 0.0–0.1)
BASOS PCT: 0 % (ref 0–1)
Eosinophils Absolute: 0 10*3/uL (ref 0.0–0.7)
Eosinophils Relative: 0 % (ref 0–5)
HCT: 45 % (ref 36.0–46.0)
Hemoglobin: 14.8 g/dL (ref 12.0–15.0)
Lymphocytes Relative: 8 % — ABNORMAL LOW (ref 12–46)
Lymphs Abs: 1.3 10*3/uL (ref 0.7–4.0)
MCH: 30.1 pg (ref 26.0–34.0)
MCHC: 32.9 g/dL (ref 30.0–36.0)
MCV: 91.5 fL (ref 78.0–100.0)
Monocytes Absolute: 1.1 10*3/uL — ABNORMAL HIGH (ref 0.1–1.0)
Monocytes Relative: 6 % (ref 3–12)
NEUTROS ABS: 14.4 10*3/uL — AB (ref 1.7–7.7)
Neutrophils Relative %: 86 % — ABNORMAL HIGH (ref 43–77)
PLATELETS: 229 10*3/uL (ref 150–400)
RBC: 4.92 MIL/uL (ref 3.87–5.11)
RDW: 14.5 % (ref 11.5–15.5)
WBC: 16.8 10*3/uL — ABNORMAL HIGH (ref 4.0–10.5)

## 2013-09-02 LAB — COMPREHENSIVE METABOLIC PANEL
ALK PHOS: 65 U/L (ref 39–117)
ALT: 32 U/L (ref 0–35)
AST: 20 U/L (ref 0–37)
Albumin: 2.5 g/dL — ABNORMAL LOW (ref 3.5–5.2)
Anion gap: 13 (ref 5–15)
BUN: 17 mg/dL (ref 6–23)
CHLORIDE: 103 meq/L (ref 96–112)
CO2: 28 mEq/L (ref 19–32)
CREATININE: 0.63 mg/dL (ref 0.50–1.10)
Calcium: 9.4 mg/dL (ref 8.4–10.5)
GFR calc Af Amer: >90 mL/min (ref >90)
GFR calc non Af Amer: 83 mL/min — ABNORMAL LOW (ref >90)
Glucose, Bld: 94 mg/dL (ref 70–99)
POTASSIUM: 3.7 meq/L (ref 3.7–5.3)
Sodium: 144 mEq/L (ref 137–147)
Total Bilirubin: 0.5 mg/dL (ref 0.3–1.2)
Total Protein: 5.6 g/dL — ABNORMAL LOW (ref 6.0–8.3)

## 2013-09-02 LAB — LIPID PANEL
CHOL/HDL RATIO: 6 ratio
Cholesterol: 151 mg/dL (ref 0–200)
HDL: 25 mg/dL — ABNORMAL LOW (ref >39)
LDL CALC: 92 mg/dL (ref 0–99)
TRIGLYCERIDES: 171 mg/dL — AB (ref <150)
VLDL: 34 mg/dL (ref 0–40)

## 2013-09-02 LAB — PROTIME-INR
INR: 1.02 (ref 0.00–1.49)
Prothrombin Time: 13.4 seconds (ref 11.6–15.2)

## 2013-09-02 LAB — PROCALCITONIN: PROCALCITONIN: 0.14 ng/mL

## 2013-09-02 LAB — T4, FREE: Free T4: 1.32 ng/dL (ref 0.80–1.80)

## 2013-09-02 LAB — FERRITIN: FERRITIN: 100 ng/mL (ref 10–291)

## 2013-09-02 LAB — TSH: TSH: 1.58 u[IU]/mL (ref 0.350–4.500)

## 2013-09-02 LAB — MAGNESIUM: MAGNESIUM: 2 mg/dL (ref 1.5–2.5)

## 2013-09-03 LAB — PROCALCITONIN: Procalcitonin: 0.1 ng/mL

## 2013-09-03 LAB — PREALBUMIN: PREALBUMIN: 30.3 mg/dL (ref 17.0–34.0)

## 2013-09-03 LAB — CBC WITH DIFFERENTIAL/PLATELET
BASOS PCT: 0 % (ref 0–1)
Basophils Absolute: 0 10*3/uL (ref 0.0–0.1)
Eosinophils Absolute: 0 10*3/uL (ref 0.0–0.7)
Eosinophils Relative: 0 % (ref 0–5)
HEMATOCRIT: 41.5 % (ref 36.0–46.0)
Hemoglobin: 14.2 g/dL (ref 12.0–15.0)
Lymphocytes Relative: 10 % — ABNORMAL LOW (ref 12–46)
Lymphs Abs: 2 10*3/uL (ref 0.7–4.0)
MCH: 30.7 pg (ref 26.0–34.0)
MCHC: 34.2 g/dL (ref 30.0–36.0)
MCV: 89.6 fL (ref 78.0–100.0)
Monocytes Absolute: 1.6 10*3/uL — ABNORMAL HIGH (ref 0.1–1.0)
Monocytes Relative: 8 % (ref 3–12)
Neutro Abs: 16.7 10*3/uL — ABNORMAL HIGH (ref 1.7–7.7)
Neutrophils Relative %: 82 % — ABNORMAL HIGH (ref 43–77)
Platelets: 240 10*3/uL (ref 150–400)
RBC: 4.63 MIL/uL (ref 3.87–5.11)
RDW: 14.5 % (ref 11.5–15.5)
WBC: 20.3 10*3/uL — ABNORMAL HIGH (ref 4.0–10.5)

## 2013-09-03 LAB — BASIC METABOLIC PANEL
Anion gap: 15 (ref 5–15)
BUN: 18 mg/dL (ref 6–23)
CALCIUM: 9.6 mg/dL (ref 8.4–10.5)
CO2: 27 meq/L (ref 19–32)
Chloride: 95 mEq/L — ABNORMAL LOW (ref 96–112)
Creatinine, Ser: 0.51 mg/dL (ref 0.50–1.10)
GFR calc Af Amer: >90 mL/min (ref >90)
GFR calc non Af Amer: 89 mL/min — ABNORMAL LOW (ref >90)
Glucose, Bld: 166 mg/dL — ABNORMAL HIGH (ref 70–99)
Potassium: 4.8 mEq/L (ref 3.7–5.3)
Sodium: 137 mEq/L (ref 137–147)

## 2013-09-03 LAB — CLOSTRIDIUM DIFFICILE BY PCR: Toxigenic C. Difficile by PCR: NEGATIVE

## 2013-09-03 LAB — FOLATE RBC: RBC Folate: 486 ng/mL (ref >280)

## 2013-09-03 LAB — PHOSPHORUS: Phosphorus: 3 mg/dL (ref 2.3–4.6)

## 2013-09-03 LAB — VANCOMYCIN, RANDOM: Vancomycin Rm: 7.4 ug/mL

## 2013-09-03 LAB — MAGNESIUM: Magnesium: 1.9 mg/dL (ref 1.5–2.5)

## 2013-09-04 LAB — CBC WITH DIFFERENTIAL/PLATELET
BASOS ABS: 0 10*3/uL (ref 0.0–0.1)
Basophils Relative: 0 % (ref 0–1)
Eosinophils Absolute: 0 10*3/uL (ref 0.0–0.7)
Eosinophils Relative: 0 % (ref 0–5)
HEMATOCRIT: 47.6 % — AB (ref 36.0–46.0)
Hemoglobin: 15.9 g/dL — ABNORMAL HIGH (ref 12.0–15.0)
LYMPHS PCT: 6 % — AB (ref 12–46)
Lymphs Abs: 1.1 10*3/uL (ref 0.7–4.0)
MCH: 30.5 pg (ref 26.0–34.0)
MCHC: 33.4 g/dL (ref 30.0–36.0)
MCV: 91.4 fL (ref 78.0–100.0)
MONO ABS: 1 10*3/uL (ref 0.1–1.0)
Monocytes Relative: 5 % (ref 3–12)
Neutro Abs: 15.7 10*3/uL — ABNORMAL HIGH (ref 1.7–7.7)
Neutrophils Relative %: 88 % — ABNORMAL HIGH (ref 43–77)
Platelets: 161 10*3/uL (ref 150–400)
RBC: 5.21 MIL/uL — ABNORMAL HIGH (ref 3.87–5.11)
RDW: 14.8 % (ref 11.5–15.5)
WBC: 17.9 10*3/uL — ABNORMAL HIGH (ref 4.0–10.5)

## 2013-09-04 LAB — MAGNESIUM: Magnesium: 2 mg/dL (ref 1.5–2.5)

## 2013-09-04 LAB — BASIC METABOLIC PANEL
ANION GAP: 16 — AB (ref 5–15)
BUN: 20 mg/dL (ref 6–23)
CHLORIDE: 97 meq/L (ref 96–112)
CO2: 26 meq/L (ref 19–32)
CREATININE: 0.52 mg/dL (ref 0.50–1.10)
Calcium: 9.6 mg/dL (ref 8.4–10.5)
GFR calc Af Amer: >90 mL/min (ref >90)
GFR calc non Af Amer: 89 mL/min — ABNORMAL LOW (ref >90)
Glucose, Bld: 176 mg/dL — ABNORMAL HIGH (ref 70–99)
Potassium: 4.5 mEq/L (ref 3.7–5.3)
Sodium: 139 mEq/L (ref 137–147)

## 2013-09-04 LAB — PHOSPHORUS: PHOSPHORUS: 2.5 mg/dL (ref 2.3–4.6)

## 2013-09-06 LAB — PREALBUMIN: Prealbumin: 29.2 mg/dL (ref 17.0–34.0)

## 2013-09-06 LAB — COMPREHENSIVE METABOLIC PANEL
ALK PHOS: 63 U/L (ref 39–117)
ALT: 21 U/L (ref 0–35)
AST: 10 U/L (ref 0–37)
Albumin: 2.6 g/dL — ABNORMAL LOW (ref 3.5–5.2)
Anion gap: 14 (ref 5–15)
BILIRUBIN TOTAL: 0.6 mg/dL (ref 0.3–1.2)
BUN: 23 mg/dL (ref 6–23)
CHLORIDE: 100 meq/L (ref 96–112)
CO2: 26 meq/L (ref 19–32)
CREATININE: 0.54 mg/dL (ref 0.50–1.10)
Calcium: 9.6 mg/dL (ref 8.4–10.5)
GFR calc Af Amer: >90 mL/min (ref >90)
GFR, EST NON AFRICAN AMERICAN: 87 mL/min — AB (ref >90)
Glucose, Bld: 261 mg/dL — ABNORMAL HIGH (ref 70–99)
POTASSIUM: 4.7 meq/L (ref 3.7–5.3)
SODIUM: 140 meq/L (ref 137–147)
Total Protein: 5.5 g/dL — ABNORMAL LOW (ref 6.0–8.3)

## 2013-09-06 LAB — CBC
HCT: 42.1 % (ref 36.0–46.0)
Hemoglobin: 13.7 g/dL (ref 12.0–15.0)
MCH: 30.2 pg (ref 26.0–34.0)
MCHC: 32.5 g/dL (ref 30.0–36.0)
MCV: 92.9 fL (ref 78.0–100.0)
PLATELETS: 178 10*3/uL (ref 150–400)
RBC: 4.53 MIL/uL (ref 3.87–5.11)
RDW: 15.1 % (ref 11.5–15.5)
WBC: 14.2 10*3/uL — AB (ref 4.0–10.5)

## 2013-09-10 LAB — CBC
HCT: 42.8 % (ref 36.0–46.0)
Hemoglobin: 13.9 g/dL (ref 12.0–15.0)
MCH: 30.7 pg (ref 26.0–34.0)
MCHC: 32.5 g/dL (ref 30.0–36.0)
MCV: 94.5 fL (ref 78.0–100.0)
Platelets: 244 10*3/uL (ref 150–400)
RBC: 4.53 MIL/uL (ref 3.87–5.11)
RDW: 16.1 % — AB (ref 11.5–15.5)
WBC: 15.1 10*3/uL — ABNORMAL HIGH (ref 4.0–10.5)

## 2013-09-10 LAB — BASIC METABOLIC PANEL
Anion gap: 16 — ABNORMAL HIGH (ref 5–15)
BUN: 16 mg/dL (ref 6–23)
CO2: 25 mEq/L (ref 19–32)
CREATININE: 0.44 mg/dL — AB (ref 0.50–1.10)
Calcium: 9.6 mg/dL (ref 8.4–10.5)
Chloride: 97 mEq/L (ref 96–112)
Glucose, Bld: 387 mg/dL — ABNORMAL HIGH (ref 70–99)
Potassium: 4.1 mEq/L (ref 3.7–5.3)
Sodium: 138 mEq/L (ref 137–147)

## 2013-09-12 ENCOUNTER — Other Ambulatory Visit (HOSPITAL_COMMUNITY): Payer: Self-pay

## 2013-09-12 DIAGNOSIS — J189 Pneumonia, unspecified organism: Secondary | ICD-10-CM | POA: Diagnosis not present

## 2013-09-12 LAB — BASIC METABOLIC PANEL
Anion gap: 12 (ref 5–15)
BUN: 21 mg/dL (ref 6–23)
CHLORIDE: 98 meq/L (ref 96–112)
CO2: 29 mEq/L (ref 19–32)
CREATININE: 0.57 mg/dL (ref 0.50–1.10)
Calcium: 9.6 mg/dL (ref 8.4–10.5)
GFR calc Af Amer: >90 mL/min (ref >90)
GFR calc non Af Amer: 85 mL/min — ABNORMAL LOW (ref >90)
Glucose, Bld: 177 mg/dL — ABNORMAL HIGH (ref 70–99)
Potassium: 4.4 mEq/L (ref 3.7–5.3)
SODIUM: 139 meq/L (ref 137–147)

## 2013-09-12 LAB — CBC WITH DIFFERENTIAL/PLATELET
BASOS ABS: 0 10*3/uL (ref 0.0–0.1)
Basophils Relative: 0 % (ref 0–1)
Eosinophils Absolute: 0.2 10*3/uL (ref 0.0–0.7)
Eosinophils Relative: 1 % (ref 0–5)
HCT: 41.7 % (ref 36.0–46.0)
Hemoglobin: 13.2 g/dL (ref 12.0–15.0)
Lymphocytes Relative: 29 % (ref 12–46)
Lymphs Abs: 3.7 10*3/uL (ref 0.7–4.0)
MCH: 30.5 pg (ref 26.0–34.0)
MCHC: 31.7 g/dL (ref 30.0–36.0)
MCV: 96.3 fL (ref 78.0–100.0)
MONO ABS: 1.1 10*3/uL — AB (ref 0.1–1.0)
Monocytes Relative: 9 % (ref 3–12)
Neutro Abs: 7.7 10*3/uL (ref 1.7–7.7)
Neutrophils Relative %: 61 % (ref 43–77)
Platelets: 200 10*3/uL (ref 150–400)
RBC: 4.33 MIL/uL (ref 3.87–5.11)
RDW: 16.7 % — AB (ref 11.5–15.5)
WBC: 12.7 10*3/uL — ABNORMAL HIGH (ref 4.0–10.5)

## 2013-09-14 LAB — CBC WITH DIFFERENTIAL/PLATELET
Basophils Absolute: 0 10*3/uL (ref 0.0–0.1)
Basophils Relative: 0 % (ref 0–1)
Eosinophils Absolute: 0.2 10*3/uL (ref 0.0–0.7)
Eosinophils Relative: 2 % (ref 0–5)
HEMATOCRIT: 44 % (ref 36.0–46.0)
HEMOGLOBIN: 13.9 g/dL (ref 12.0–15.0)
LYMPHS ABS: 3.9 10*3/uL (ref 0.7–4.0)
LYMPHS PCT: 31 % (ref 12–46)
MCH: 30.1 pg (ref 26.0–34.0)
MCHC: 31.6 g/dL (ref 30.0–36.0)
MCV: 95.2 fL (ref 78.0–100.0)
MONO ABS: 0.9 10*3/uL (ref 0.1–1.0)
MONOS PCT: 8 % (ref 3–12)
NEUTROS ABS: 7.4 10*3/uL (ref 1.7–7.7)
Neutrophils Relative %: 59 % (ref 43–77)
Platelets: 197 10*3/uL (ref 150–400)
RBC: 4.62 MIL/uL (ref 3.87–5.11)
RDW: 16.9 % — ABNORMAL HIGH (ref 11.5–15.5)
WBC: 12.3 10*3/uL — AB (ref 4.0–10.5)

## 2013-09-14 LAB — BASIC METABOLIC PANEL
Anion gap: 9 (ref 5–15)
BUN: 16 mg/dL (ref 6–23)
CHLORIDE: 103 meq/L (ref 96–112)
CO2: 32 meq/L (ref 19–32)
Calcium: 10 mg/dL (ref 8.4–10.5)
Creatinine, Ser: 0.53 mg/dL (ref 0.50–1.10)
GFR calc Af Amer: >90 mL/min (ref >90)
GFR calc non Af Amer: 87 mL/min — ABNORMAL LOW (ref >90)
Glucose, Bld: 125 mg/dL — ABNORMAL HIGH (ref 70–99)
Potassium: 4.6 mEq/L (ref 3.7–5.3)
Sodium: 144 mEq/L (ref 137–147)

## 2013-09-17 LAB — CBC WITH DIFFERENTIAL/PLATELET
Basophils Absolute: 0 10*3/uL (ref 0.0–0.1)
Basophils Relative: 0 % (ref 0–1)
Eosinophils Absolute: 0.3 10*3/uL (ref 0.0–0.7)
Eosinophils Relative: 2 % (ref 0–5)
HCT: 41.2 % (ref 36.0–46.0)
HEMOGLOBIN: 13 g/dL (ref 12.0–15.0)
LYMPHS ABS: 3.8 10*3/uL (ref 0.7–4.0)
Lymphocytes Relative: 34 % (ref 12–46)
MCH: 30.5 pg (ref 26.0–34.0)
MCHC: 31.6 g/dL (ref 30.0–36.0)
MCV: 96.7 fL (ref 78.0–100.0)
MONOS PCT: 7 % (ref 3–12)
Monocytes Absolute: 0.8 10*3/uL (ref 0.1–1.0)
NEUTROS PCT: 57 % (ref 43–77)
Neutro Abs: 6.4 10*3/uL (ref 1.7–7.7)
Platelets: 145 10*3/uL — ABNORMAL LOW (ref 150–400)
RBC: 4.26 MIL/uL (ref 3.87–5.11)
RDW: 17.1 % — ABNORMAL HIGH (ref 11.5–15.5)
WBC: 11.3 10*3/uL — ABNORMAL HIGH (ref 4.0–10.5)

## 2013-09-17 LAB — BASIC METABOLIC PANEL
Anion gap: 11 (ref 5–15)
BUN: 14 mg/dL (ref 6–23)
CO2: 30 meq/L (ref 19–32)
Calcium: 9.6 mg/dL (ref 8.4–10.5)
Chloride: 103 mEq/L (ref 96–112)
Creatinine, Ser: 0.52 mg/dL (ref 0.50–1.10)
GFR calc Af Amer: >90 mL/min (ref >90)
GFR, EST NON AFRICAN AMERICAN: 88 mL/min — AB (ref >90)
GLUCOSE: 91 mg/dL (ref 70–99)
POTASSIUM: 4.3 meq/L (ref 3.7–5.3)
Sodium: 144 mEq/L (ref 137–147)

## 2013-09-17 LAB — MAGNESIUM: Magnesium: 1.9 mg/dL (ref 1.5–2.5)

## 2013-09-17 LAB — PHOSPHORUS: PHOSPHORUS: 3.2 mg/dL (ref 2.3–4.6)

## 2013-09-20 LAB — COMPREHENSIVE METABOLIC PANEL
ALK PHOS: 58 U/L (ref 39–117)
ALT: 15 U/L (ref 0–35)
ANION GAP: 11 (ref 5–15)
AST: 12 U/L (ref 0–37)
Albumin: 2.7 g/dL — ABNORMAL LOW (ref 3.5–5.2)
BUN: 12 mg/dL (ref 6–23)
CHLORIDE: 103 meq/L (ref 96–112)
CO2: 29 meq/L (ref 19–32)
Calcium: 9.5 mg/dL (ref 8.4–10.5)
Creatinine, Ser: 0.62 mg/dL (ref 0.50–1.10)
GFR calc non Af Amer: 83 mL/min — ABNORMAL LOW (ref >90)
Glucose, Bld: 125 mg/dL — ABNORMAL HIGH (ref 70–99)
Potassium: 4 mEq/L (ref 3.7–5.3)
Sodium: 143 mEq/L (ref 137–147)
Total Bilirubin: 0.3 mg/dL (ref 0.3–1.2)
Total Protein: 5.3 g/dL — ABNORMAL LOW (ref 6.0–8.3)

## 2013-09-20 LAB — CBC
HEMATOCRIT: 40.5 % (ref 36.0–46.0)
HEMOGLOBIN: 12.6 g/dL (ref 12.0–15.0)
MCH: 30.7 pg (ref 26.0–34.0)
MCHC: 31.1 g/dL (ref 30.0–36.0)
MCV: 98.8 fL (ref 78.0–100.0)
Platelets: ADEQUATE 10*3/uL (ref 150–400)
RBC: 4.1 MIL/uL (ref 3.87–5.11)
RDW: 17.3 % — ABNORMAL HIGH (ref 11.5–15.5)
WBC: 6.9 10*3/uL (ref 4.0–10.5)

## 2013-09-20 LAB — PREALBUMIN: Prealbumin: 23.1 mg/dL (ref 17.0–34.0)

## 2013-09-21 ENCOUNTER — Other Ambulatory Visit (HOSPITAL_COMMUNITY): Payer: Self-pay

## 2013-09-21 DIAGNOSIS — J159 Unspecified bacterial pneumonia: Secondary | ICD-10-CM | POA: Diagnosis not present

## 2013-09-21 DIAGNOSIS — Z5189 Encounter for other specified aftercare: Secondary | ICD-10-CM | POA: Diagnosis not present

## 2013-09-21 DIAGNOSIS — E1142 Type 2 diabetes mellitus with diabetic polyneuropathy: Secondary | ICD-10-CM | POA: Diagnosis not present

## 2013-09-21 DIAGNOSIS — R1313 Dysphagia, pharyngeal phase: Secondary | ICD-10-CM | POA: Diagnosis not present

## 2013-09-21 DIAGNOSIS — J441 Chronic obstructive pulmonary disease with (acute) exacerbation: Secondary | ICD-10-CM | POA: Diagnosis not present

## 2013-09-21 DIAGNOSIS — J189 Pneumonia, unspecified organism: Secondary | ICD-10-CM | POA: Diagnosis not present

## 2013-09-21 DIAGNOSIS — E119 Type 2 diabetes mellitus without complications: Secondary | ICD-10-CM | POA: Diagnosis not present

## 2013-09-21 DIAGNOSIS — M6281 Muscle weakness (generalized): Secondary | ICD-10-CM | POA: Diagnosis not present

## 2013-09-21 DIAGNOSIS — R609 Edema, unspecified: Secondary | ICD-10-CM | POA: Diagnosis not present

## 2013-09-21 DIAGNOSIS — G4733 Obstructive sleep apnea (adult) (pediatric): Secondary | ICD-10-CM | POA: Diagnosis not present

## 2013-09-21 DIAGNOSIS — R933 Abnormal findings on diagnostic imaging of other parts of digestive tract: Secondary | ICD-10-CM | POA: Diagnosis not present

## 2013-09-21 DIAGNOSIS — I10 Essential (primary) hypertension: Secondary | ICD-10-CM | POA: Diagnosis not present

## 2013-09-21 DIAGNOSIS — I2510 Atherosclerotic heart disease of native coronary artery without angina pectoris: Secondary | ICD-10-CM | POA: Diagnosis not present

## 2013-09-21 DIAGNOSIS — E039 Hypothyroidism, unspecified: Secondary | ICD-10-CM | POA: Diagnosis not present

## 2013-09-21 DIAGNOSIS — E46 Unspecified protein-calorie malnutrition: Secondary | ICD-10-CM | POA: Diagnosis not present

## 2013-09-21 DIAGNOSIS — J9600 Acute respiratory failure, unspecified whether with hypoxia or hypercapnia: Secondary | ICD-10-CM | POA: Diagnosis not present

## 2013-09-21 MED ORDER — TECHNETIUM TC 99M-LABELED RED BLOOD CELLS IV KIT
25.0000 | PACK | Freq: Once | INTRAVENOUS | Status: AC | PRN
  Administered 2013-09-21: 25 via INTRAVENOUS

## 2013-09-29 DIAGNOSIS — R609 Edema, unspecified: Secondary | ICD-10-CM | POA: Diagnosis not present

## 2013-10-04 DIAGNOSIS — R609 Edema, unspecified: Secondary | ICD-10-CM | POA: Diagnosis not present

## 2013-10-12 DIAGNOSIS — E119 Type 2 diabetes mellitus without complications: Secondary | ICD-10-CM | POA: Diagnosis not present

## 2013-10-12 DIAGNOSIS — I10 Essential (primary) hypertension: Secondary | ICD-10-CM | POA: Diagnosis not present

## 2013-10-12 DIAGNOSIS — R5383 Other fatigue: Secondary | ICD-10-CM | POA: Diagnosis not present

## 2013-10-12 DIAGNOSIS — Z794 Long term (current) use of insulin: Secondary | ICD-10-CM | POA: Diagnosis not present

## 2013-10-12 DIAGNOSIS — J441 Chronic obstructive pulmonary disease with (acute) exacerbation: Secondary | ICD-10-CM | POA: Diagnosis not present

## 2013-10-12 DIAGNOSIS — R5381 Other malaise: Secondary | ICD-10-CM | POA: Diagnosis not present

## 2013-10-14 DIAGNOSIS — J441 Chronic obstructive pulmonary disease with (acute) exacerbation: Secondary | ICD-10-CM | POA: Diagnosis not present

## 2013-10-14 DIAGNOSIS — I10 Essential (primary) hypertension: Secondary | ICD-10-CM | POA: Diagnosis not present

## 2013-10-14 DIAGNOSIS — Z794 Long term (current) use of insulin: Secondary | ICD-10-CM | POA: Diagnosis not present

## 2013-10-14 DIAGNOSIS — E119 Type 2 diabetes mellitus without complications: Secondary | ICD-10-CM | POA: Diagnosis not present

## 2013-10-14 DIAGNOSIS — R5383 Other fatigue: Secondary | ICD-10-CM | POA: Diagnosis not present

## 2013-10-14 DIAGNOSIS — R5381 Other malaise: Secondary | ICD-10-CM | POA: Diagnosis not present

## 2013-10-15 DIAGNOSIS — I10 Essential (primary) hypertension: Secondary | ICD-10-CM | POA: Diagnosis not present

## 2013-10-15 DIAGNOSIS — R5383 Other fatigue: Secondary | ICD-10-CM | POA: Diagnosis not present

## 2013-10-15 DIAGNOSIS — R5381 Other malaise: Secondary | ICD-10-CM | POA: Diagnosis not present

## 2013-10-15 DIAGNOSIS — J441 Chronic obstructive pulmonary disease with (acute) exacerbation: Secondary | ICD-10-CM | POA: Diagnosis not present

## 2013-10-15 DIAGNOSIS — Z794 Long term (current) use of insulin: Secondary | ICD-10-CM | POA: Diagnosis not present

## 2013-10-15 DIAGNOSIS — E119 Type 2 diabetes mellitus without complications: Secondary | ICD-10-CM | POA: Diagnosis not present

## 2013-10-18 DIAGNOSIS — R5381 Other malaise: Secondary | ICD-10-CM | POA: Diagnosis not present

## 2013-10-18 DIAGNOSIS — I10 Essential (primary) hypertension: Secondary | ICD-10-CM | POA: Diagnosis not present

## 2013-10-18 DIAGNOSIS — Z794 Long term (current) use of insulin: Secondary | ICD-10-CM | POA: Diagnosis not present

## 2013-10-18 DIAGNOSIS — E119 Type 2 diabetes mellitus without complications: Secondary | ICD-10-CM | POA: Diagnosis not present

## 2013-10-18 DIAGNOSIS — J441 Chronic obstructive pulmonary disease with (acute) exacerbation: Secondary | ICD-10-CM | POA: Diagnosis not present

## 2013-10-18 DIAGNOSIS — R5383 Other fatigue: Secondary | ICD-10-CM | POA: Diagnosis not present

## 2013-10-19 DIAGNOSIS — Z794 Long term (current) use of insulin: Secondary | ICD-10-CM | POA: Diagnosis not present

## 2013-10-19 DIAGNOSIS — R5381 Other malaise: Secondary | ICD-10-CM | POA: Diagnosis not present

## 2013-10-19 DIAGNOSIS — I10 Essential (primary) hypertension: Secondary | ICD-10-CM | POA: Diagnosis not present

## 2013-10-19 DIAGNOSIS — J441 Chronic obstructive pulmonary disease with (acute) exacerbation: Secondary | ICD-10-CM | POA: Diagnosis not present

## 2013-10-19 DIAGNOSIS — E119 Type 2 diabetes mellitus without complications: Secondary | ICD-10-CM | POA: Diagnosis not present

## 2013-10-19 DIAGNOSIS — R5383 Other fatigue: Secondary | ICD-10-CM | POA: Diagnosis not present

## 2013-10-20 DIAGNOSIS — J441 Chronic obstructive pulmonary disease with (acute) exacerbation: Secondary | ICD-10-CM | POA: Diagnosis not present

## 2013-10-20 DIAGNOSIS — E119 Type 2 diabetes mellitus without complications: Secondary | ICD-10-CM | POA: Diagnosis not present

## 2013-10-20 DIAGNOSIS — R319 Hematuria, unspecified: Secondary | ICD-10-CM | POA: Diagnosis not present

## 2013-10-20 DIAGNOSIS — R5381 Other malaise: Secondary | ICD-10-CM | POA: Diagnosis not present

## 2013-10-20 DIAGNOSIS — I10 Essential (primary) hypertension: Secondary | ICD-10-CM | POA: Diagnosis not present

## 2013-10-20 DIAGNOSIS — Z794 Long term (current) use of insulin: Secondary | ICD-10-CM | POA: Diagnosis not present

## 2013-10-21 DIAGNOSIS — R5381 Other malaise: Secondary | ICD-10-CM | POA: Diagnosis not present

## 2013-10-21 DIAGNOSIS — Z794 Long term (current) use of insulin: Secondary | ICD-10-CM | POA: Diagnosis not present

## 2013-10-21 DIAGNOSIS — R5383 Other fatigue: Secondary | ICD-10-CM | POA: Diagnosis not present

## 2013-10-21 DIAGNOSIS — E119 Type 2 diabetes mellitus without complications: Secondary | ICD-10-CM | POA: Diagnosis not present

## 2013-10-21 DIAGNOSIS — I10 Essential (primary) hypertension: Secondary | ICD-10-CM | POA: Diagnosis not present

## 2013-10-21 DIAGNOSIS — J441 Chronic obstructive pulmonary disease with (acute) exacerbation: Secondary | ICD-10-CM | POA: Diagnosis not present

## 2013-10-22 DIAGNOSIS — R5381 Other malaise: Secondary | ICD-10-CM | POA: Diagnosis not present

## 2013-10-22 DIAGNOSIS — J441 Chronic obstructive pulmonary disease with (acute) exacerbation: Secondary | ICD-10-CM | POA: Diagnosis not present

## 2013-10-22 DIAGNOSIS — I10 Essential (primary) hypertension: Secondary | ICD-10-CM | POA: Diagnosis not present

## 2013-10-22 DIAGNOSIS — Z794 Long term (current) use of insulin: Secondary | ICD-10-CM | POA: Diagnosis not present

## 2013-10-22 DIAGNOSIS — E119 Type 2 diabetes mellitus without complications: Secondary | ICD-10-CM | POA: Diagnosis not present

## 2013-10-25 DIAGNOSIS — Z23 Encounter for immunization: Secondary | ICD-10-CM | POA: Diagnosis not present

## 2013-10-25 DIAGNOSIS — IMO0001 Reserved for inherently not codable concepts without codable children: Secondary | ICD-10-CM | POA: Diagnosis not present

## 2013-10-25 DIAGNOSIS — N3000 Acute cystitis without hematuria: Secondary | ICD-10-CM | POA: Diagnosis not present

## 2013-10-26 DIAGNOSIS — G4730 Sleep apnea, unspecified: Secondary | ICD-10-CM | POA: Diagnosis not present

## 2013-10-26 DIAGNOSIS — E119 Type 2 diabetes mellitus without complications: Secondary | ICD-10-CM | POA: Diagnosis not present

## 2013-10-26 DIAGNOSIS — R5383 Other fatigue: Secondary | ICD-10-CM | POA: Diagnosis not present

## 2013-10-26 DIAGNOSIS — I10 Essential (primary) hypertension: Secondary | ICD-10-CM | POA: Diagnosis not present

## 2013-10-26 DIAGNOSIS — J441 Chronic obstructive pulmonary disease with (acute) exacerbation: Secondary | ICD-10-CM | POA: Diagnosis not present

## 2013-10-26 DIAGNOSIS — G4710 Hypersomnia, unspecified: Secondary | ICD-10-CM | POA: Diagnosis not present

## 2013-10-26 DIAGNOSIS — Z794 Long term (current) use of insulin: Secondary | ICD-10-CM | POA: Diagnosis not present

## 2013-10-26 DIAGNOSIS — J45909 Unspecified asthma, uncomplicated: Secondary | ICD-10-CM | POA: Diagnosis not present

## 2013-10-26 DIAGNOSIS — R5381 Other malaise: Secondary | ICD-10-CM | POA: Diagnosis not present

## 2013-10-26 DIAGNOSIS — J3089 Other allergic rhinitis: Secondary | ICD-10-CM | POA: Diagnosis not present

## 2013-10-27 DIAGNOSIS — R5381 Other malaise: Secondary | ICD-10-CM | POA: Diagnosis not present

## 2013-10-27 DIAGNOSIS — J441 Chronic obstructive pulmonary disease with (acute) exacerbation: Secondary | ICD-10-CM | POA: Diagnosis not present

## 2013-10-27 DIAGNOSIS — R310 Gross hematuria: Secondary | ICD-10-CM | POA: Diagnosis not present

## 2013-10-27 DIAGNOSIS — Z794 Long term (current) use of insulin: Secondary | ICD-10-CM | POA: Diagnosis not present

## 2013-10-27 DIAGNOSIS — R5383 Other fatigue: Secondary | ICD-10-CM | POA: Diagnosis not present

## 2013-10-27 DIAGNOSIS — N3090 Cystitis, unspecified without hematuria: Secondary | ICD-10-CM | POA: Diagnosis not present

## 2013-10-27 DIAGNOSIS — I10 Essential (primary) hypertension: Secondary | ICD-10-CM | POA: Diagnosis not present

## 2013-10-27 DIAGNOSIS — E119 Type 2 diabetes mellitus without complications: Secondary | ICD-10-CM | POA: Diagnosis not present

## 2013-10-28 DIAGNOSIS — R5383 Other fatigue: Secondary | ICD-10-CM | POA: Diagnosis not present

## 2013-10-28 DIAGNOSIS — E119 Type 2 diabetes mellitus without complications: Secondary | ICD-10-CM | POA: Diagnosis not present

## 2013-10-28 DIAGNOSIS — Z794 Long term (current) use of insulin: Secondary | ICD-10-CM | POA: Diagnosis not present

## 2013-10-28 DIAGNOSIS — R5381 Other malaise: Secondary | ICD-10-CM | POA: Diagnosis not present

## 2013-10-28 DIAGNOSIS — J441 Chronic obstructive pulmonary disease with (acute) exacerbation: Secondary | ICD-10-CM | POA: Diagnosis not present

## 2013-10-28 DIAGNOSIS — I10 Essential (primary) hypertension: Secondary | ICD-10-CM | POA: Diagnosis not present

## 2013-11-01 DIAGNOSIS — R5381 Other malaise: Secondary | ICD-10-CM | POA: Diagnosis not present

## 2013-11-01 DIAGNOSIS — I10 Essential (primary) hypertension: Secondary | ICD-10-CM | POA: Diagnosis not present

## 2013-11-01 DIAGNOSIS — R5383 Other fatigue: Secondary | ICD-10-CM | POA: Diagnosis not present

## 2013-11-01 DIAGNOSIS — E119 Type 2 diabetes mellitus without complications: Secondary | ICD-10-CM | POA: Diagnosis not present

## 2013-11-01 DIAGNOSIS — Z794 Long term (current) use of insulin: Secondary | ICD-10-CM | POA: Diagnosis not present

## 2013-11-01 DIAGNOSIS — J441 Chronic obstructive pulmonary disease with (acute) exacerbation: Secondary | ICD-10-CM | POA: Diagnosis not present

## 2013-11-03 DIAGNOSIS — D3500 Benign neoplasm of unspecified adrenal gland: Secondary | ICD-10-CM | POA: Diagnosis not present

## 2013-11-03 DIAGNOSIS — R319 Hematuria, unspecified: Secondary | ICD-10-CM | POA: Diagnosis not present

## 2013-11-03 DIAGNOSIS — N200 Calculus of kidney: Secondary | ICD-10-CM | POA: Diagnosis not present

## 2013-11-04 DIAGNOSIS — Z794 Long term (current) use of insulin: Secondary | ICD-10-CM | POA: Diagnosis not present

## 2013-11-04 DIAGNOSIS — J441 Chronic obstructive pulmonary disease with (acute) exacerbation: Secondary | ICD-10-CM | POA: Diagnosis not present

## 2013-11-04 DIAGNOSIS — R5383 Other fatigue: Secondary | ICD-10-CM | POA: Diagnosis not present

## 2013-11-04 DIAGNOSIS — E119 Type 2 diabetes mellitus without complications: Secondary | ICD-10-CM | POA: Diagnosis not present

## 2013-11-04 DIAGNOSIS — R5381 Other malaise: Secondary | ICD-10-CM | POA: Diagnosis not present

## 2013-11-04 DIAGNOSIS — I10 Essential (primary) hypertension: Secondary | ICD-10-CM | POA: Diagnosis not present

## 2013-11-05 DIAGNOSIS — Z794 Long term (current) use of insulin: Secondary | ICD-10-CM | POA: Diagnosis not present

## 2013-11-05 DIAGNOSIS — E119 Type 2 diabetes mellitus without complications: Secondary | ICD-10-CM | POA: Diagnosis not present

## 2013-11-05 DIAGNOSIS — I10 Essential (primary) hypertension: Secondary | ICD-10-CM | POA: Diagnosis not present

## 2013-11-05 DIAGNOSIS — R5381 Other malaise: Secondary | ICD-10-CM | POA: Diagnosis not present

## 2013-11-05 DIAGNOSIS — J441 Chronic obstructive pulmonary disease with (acute) exacerbation: Secondary | ICD-10-CM | POA: Diagnosis not present

## 2013-11-05 DIAGNOSIS — R5383 Other fatigue: Secondary | ICD-10-CM | POA: Diagnosis not present

## 2013-11-09 DIAGNOSIS — R5383 Other fatigue: Secondary | ICD-10-CM | POA: Diagnosis not present

## 2013-11-09 DIAGNOSIS — Z794 Long term (current) use of insulin: Secondary | ICD-10-CM | POA: Diagnosis not present

## 2013-11-09 DIAGNOSIS — J441 Chronic obstructive pulmonary disease with (acute) exacerbation: Secondary | ICD-10-CM | POA: Diagnosis not present

## 2013-11-09 DIAGNOSIS — E119 Type 2 diabetes mellitus without complications: Secondary | ICD-10-CM | POA: Diagnosis not present

## 2013-11-09 DIAGNOSIS — I10 Essential (primary) hypertension: Secondary | ICD-10-CM | POA: Diagnosis not present

## 2013-11-09 DIAGNOSIS — R5381 Other malaise: Secondary | ICD-10-CM | POA: Diagnosis not present

## 2013-11-11 ENCOUNTER — Ambulatory Visit (INDEPENDENT_AMBULATORY_CARE_PROVIDER_SITE_OTHER): Payer: Medicare Other

## 2013-11-11 VITALS — BP 118/73 | HR 82 | Resp 18

## 2013-11-11 DIAGNOSIS — E1149 Type 2 diabetes mellitus with other diabetic neurological complication: Secondary | ICD-10-CM

## 2013-11-11 DIAGNOSIS — E1140 Type 2 diabetes mellitus with diabetic neuropathy, unspecified: Secondary | ICD-10-CM

## 2013-11-11 DIAGNOSIS — Q828 Other specified congenital malformations of skin: Secondary | ICD-10-CM

## 2013-11-11 DIAGNOSIS — M2040 Other hammer toe(s) (acquired), unspecified foot: Secondary | ICD-10-CM | POA: Diagnosis not present

## 2013-11-11 DIAGNOSIS — L608 Other nail disorders: Secondary | ICD-10-CM | POA: Diagnosis not present

## 2013-11-11 DIAGNOSIS — E1142 Type 2 diabetes mellitus with diabetic polyneuropathy: Secondary | ICD-10-CM | POA: Diagnosis not present

## 2013-11-11 NOTE — Patient Instructions (Signed)
Diabetes and Foot Care Diabetes may cause you to have problems because of poor blood supply (circulation) to your feet and legs. This may cause the skin on your feet to become thinner, break easier, and heal more slowly. Your skin may become dry, and the skin may peel and crack. You may also have nerve damage in your legs and feet causing decreased feeling in them. You may not notice minor injuries to your feet that could lead to infections or more serious problems. Taking care of your feet is one of the most important things you can do for yourself.  HOME CARE INSTRUCTIONS  Wear shoes at all times, even in the house. Do not go barefoot. Bare feet are easily injured.  Check your feet daily for blisters, cuts, and redness. If you cannot see the bottom of your feet, use a mirror or ask someone for help.  Wash your feet with warm water (do not use hot water) and mild soap. Then pat your feet and the areas between your toes until they are completely dry. Do not soak your feet as this can dry your skin.  Apply a moisturizing lotion or petroleum jelly (that does not contain alcohol and is unscented) to the skin on your feet and to dry, brittle toenails. Do not apply lotion between your toes.  Trim your toenails straight across. Do not dig under them or around the cuticle. File the edges of your nails with an emery board or nail file.  Do not cut corns or calluses or try to remove them with medicine.  Wear clean socks or stockings every day. Make sure they are not too tight. Do not wear knee-high stockings since they may decrease blood flow to your legs.  Wear shoes that fit properly and have enough cushioning. To break in new shoes, wear them for just a few hours a day. This prevents you from injuring your feet. Always look in your shoes before you put them on to be sure there are no objects inside.  Do not cross your legs. This may decrease the blood flow to your feet.  If you find a minor scrape,  cut, or break in the skin on your feet, keep it and the skin around it clean and dry. These areas may be cleansed with mild soap and water. Do not cleanse the area with peroxide, alcohol, or iodine.  When you remove an adhesive bandage, be sure not to damage the skin around it.  If you have a wound, look at it several times a day to make sure it is healing.  Do not use heating pads or hot water bottles. They may burn your skin. If you have lost feeling in your feet or legs, you may not know it is happening until it is too late.  Make sure your health care provider performs a complete foot exam at least annually or more often if you have foot problems. Report any cuts, sores, or bruises to your health care provider immediately. SEEK MEDICAL CARE IF:   You have an injury that is not healing.  You have cuts or breaks in the skin.  You have an ingrown nail.  You notice redness on your legs or feet.  You feel burning or tingling in your legs or feet.  You have pain or cramps in your legs and feet.  Your legs or feet are numb.  Your feet always feel cold. SEEK IMMEDIATE MEDICAL CARE IF:   There is increasing redness,   swelling, or pain in or around a wound.  There is a red line that goes up your leg.  Pus is coming from a wound.  You develop a fever or as directed by your health care provider.  You notice a bad smell coming from an ulcer or wound. Document Released: 02/09/2000 Document Revised: 10/14/2012 Document Reviewed: 07/21/2012 Exit[employer] Patient Information 2015 [employer]. This information is not intended to replace advice given to you by your health care provider. Make sure you discuss any questions you have with your health care provider.  

## 2013-11-11 NOTE — Progress Notes (Signed)
   Subjective:    Patient ID: Shelia Sullivan, female    DOB: 1931/12/24, 80 y.o.   MRN: [id number]  HPI I AM HERE TO HAVE MY TOENAILS TRIMMED AND TO GET MY SHOES    Review of Systems no new findings or systemic changes noted     Objective:   Physical Exam Patient presents at this time for diabetic foot nail care and pick up and fitting of diabetic extra-depth shoes. Neurovascular status unchanged DP and PT plus one over 4 bilateral Or refill time 4 seconds all digits epicritic sensation diminished to forefoot digits and plantar arch consistent with Thornell Mule testing loss patient also has distal keratoses distal clavus third bilateral in sub-fifth left. Has digital contractures and hammertoe/claw toe type deformity to history keratoses and ulceration. Currently nails thick brittle crumbly dystrophic 2 through 5 bilateral previous nail excisions both hallux. Orthopedic exam reveals hammertoe deformities digital contractures mild HAV deformity patient has had diabetic shoes in the past which are worn need replacing 1 pair of diabetic accident shoes with a conforming type designed and 3 pairs of dual density Plastizote inlays are dispensed the shoes in lace fit and contour well to the patient's foot with full contact being noted adequate space for toes       Assessment & Plan:  Assessment this time his diabetes with complications and peripheral neuropathy and angiopathy. Digital contractures are noted with associated keratoses the nails debrided x8 this time also multiple keratoses distal third bilateral in sub-fifth left are also debridement this time the shoes and inlays are dispensed with break in use and wear instructions. Recheck in 3 months for followup and continued palliative care in the future as needed  [name]

## 2013-11-12 DIAGNOSIS — R5383 Other fatigue: Secondary | ICD-10-CM | POA: Diagnosis not present

## 2013-11-12 DIAGNOSIS — I10 Essential (primary) hypertension: Secondary | ICD-10-CM | POA: Diagnosis not present

## 2013-11-12 DIAGNOSIS — J441 Chronic obstructive pulmonary disease with (acute) exacerbation: Secondary | ICD-10-CM | POA: Diagnosis not present

## 2013-11-12 DIAGNOSIS — R5381 Other malaise: Secondary | ICD-10-CM | POA: Diagnosis not present

## 2013-11-12 DIAGNOSIS — Z794 Long term (current) use of insulin: Secondary | ICD-10-CM | POA: Diagnosis not present

## 2013-11-12 DIAGNOSIS — E119 Type 2 diabetes mellitus without complications: Secondary | ICD-10-CM | POA: Diagnosis not present

## 2013-11-16 DIAGNOSIS — G4730 Sleep apnea, unspecified: Secondary | ICD-10-CM | POA: Diagnosis not present

## 2013-11-16 DIAGNOSIS — G4710 Hypersomnia, unspecified: Secondary | ICD-10-CM | POA: Diagnosis not present

## 2013-11-17 DIAGNOSIS — R310 Gross hematuria: Secondary | ICD-10-CM | POA: Diagnosis not present

## 2013-11-17 DIAGNOSIS — N390 Urinary tract infection, site not specified: Secondary | ICD-10-CM | POA: Diagnosis not present

## 2013-12-02 DIAGNOSIS — J4530 Mild persistent asthma, uncomplicated: Secondary | ICD-10-CM | POA: Diagnosis not present

## 2013-12-02 DIAGNOSIS — G4733 Obstructive sleep apnea (adult) (pediatric): Secondary | ICD-10-CM | POA: Diagnosis not present

## 2013-12-02 DIAGNOSIS — R5383 Other fatigue: Secondary | ICD-10-CM | POA: Diagnosis not present

## 2013-12-02 DIAGNOSIS — J309 Allergic rhinitis, unspecified: Secondary | ICD-10-CM | POA: Diagnosis not present

## 2013-12-14 DIAGNOSIS — E119 Type 2 diabetes mellitus without complications: Secondary | ICD-10-CM | POA: Diagnosis not present

## 2013-12-14 DIAGNOSIS — H3530 Unspecified macular degeneration: Secondary | ICD-10-CM | POA: Diagnosis not present

## 2013-12-15 DIAGNOSIS — N3080 Other cystitis without hematuria: Secondary | ICD-10-CM | POA: Diagnosis not present

## 2013-12-15 DIAGNOSIS — N952 Postmenopausal atrophic vaginitis: Secondary | ICD-10-CM | POA: Diagnosis not present

## 2013-12-20 DIAGNOSIS — I771 Stricture of artery: Secondary | ICD-10-CM | POA: Diagnosis not present

## 2013-12-20 DIAGNOSIS — R609 Edema, unspecified: Secondary | ICD-10-CM | POA: Diagnosis not present

## 2014-01-13 DIAGNOSIS — H3531 Nonexudative age-related macular degeneration: Secondary | ICD-10-CM | POA: Diagnosis not present

## 2014-01-25 DIAGNOSIS — E1165 Type 2 diabetes mellitus with hyperglycemia: Secondary | ICD-10-CM | POA: Diagnosis not present

## 2014-01-31 DIAGNOSIS — E782 Mixed hyperlipidemia: Secondary | ICD-10-CM | POA: Diagnosis not present

## 2014-01-31 DIAGNOSIS — E039 Hypothyroidism, unspecified: Secondary | ICD-10-CM | POA: Diagnosis not present

## 2014-01-31 DIAGNOSIS — I771 Stricture of artery: Secondary | ICD-10-CM | POA: Diagnosis not present

## 2014-01-31 DIAGNOSIS — E1165 Type 2 diabetes mellitus with hyperglycemia: Secondary | ICD-10-CM | POA: Diagnosis not present

## 2014-02-02 DIAGNOSIS — J4530 Mild persistent asthma, uncomplicated: Secondary | ICD-10-CM | POA: Diagnosis not present

## 2014-02-02 DIAGNOSIS — G4733 Obstructive sleep apnea (adult) (pediatric): Secondary | ICD-10-CM | POA: Diagnosis not present

## 2014-02-02 DIAGNOSIS — R5383 Other fatigue: Secondary | ICD-10-CM | POA: Diagnosis not present

## 2014-02-02 DIAGNOSIS — J310 Chronic rhinitis: Secondary | ICD-10-CM | POA: Diagnosis not present

## 2014-02-10 DIAGNOSIS — H3531 Nonexudative age-related macular degeneration: Secondary | ICD-10-CM | POA: Diagnosis not present

## 2014-02-14 ENCOUNTER — Ambulatory Visit (INDEPENDENT_AMBULATORY_CARE_PROVIDER_SITE_OTHER): Payer: Medicare Other

## 2014-02-14 VITALS — BP 112/64 | HR 69 | Resp 12

## 2014-02-14 DIAGNOSIS — L603 Nail dystrophy: Secondary | ICD-10-CM

## 2014-02-14 DIAGNOSIS — E1140 Type 2 diabetes mellitus with diabetic neuropathy, unspecified: Secondary | ICD-10-CM | POA: Diagnosis not present

## 2014-02-14 DIAGNOSIS — Q828 Other specified congenital malformations of skin: Secondary | ICD-10-CM

## 2014-02-14 DIAGNOSIS — N952 Postmenopausal atrophic vaginitis: Secondary | ICD-10-CM | POA: Diagnosis not present

## 2014-02-14 DIAGNOSIS — N3090 Cystitis, unspecified without hematuria: Secondary | ICD-10-CM | POA: Diagnosis not present

## 2014-02-14 NOTE — Progress Notes (Signed)
   Subjective:    Patient ID: Shelia Sullivan, female    DOB: [birth year], 80 y.o.   MRN: [id number]  HPI  TRIM MY TOENAILS.  Review of Systems no new findings or systemic changes noted     Objective:   Physical Exam Vascular status appears to be intact DP and PT one over 4 bilateral Refill time 4 seconds all digits decreased sensation to the forefoot digits and arch there is keratoses with distal clavus third bilateral in sub-fifth bilateral with pinch callus first MTP being noted as well. Patient's had previous nail excisions both hallux are remaining nails thick criptotic incurvated 234 and 5 both feet no open wounds no ulcers patient is wearing her diabetic shoes properly.       Assessment & Plan:  Assessment diabetes with history peripheral neuropathy complications hammertoe deformities with keratoses distal clavus third bilateral pinch first right and sub-5 bilateral. Multiple keratotic lesion debridement multiple dystrophic friable criptotic nails 2 through 5 bilateral debrided return for palliative nail care in 2-3 months or as directed on an as-needed basis in the future as recommended   [name]

## 2014-02-14 NOTE — Patient Instructions (Signed)
Diabetes and Foot Care Diabetes may cause you to have problems because of poor blood supply (circulation) to your feet and legs. This may cause the skin on your feet to become thinner, break easier, and heal more slowly. Your skin may become dry, and the skin may peel and crack. You may also have nerve damage in your legs and feet causing decreased feeling in them. You may not notice minor injuries to your feet that could lead to infections or more serious problems. Taking care of your feet is one of the most important things you can do for yourself.  HOME CARE INSTRUCTIONS  Wear shoes at all times, even in the house. Do not go barefoot. Bare feet are easily injured.  Check your feet daily for blisters, cuts, and redness. If you cannot see the bottom of your feet, use a mirror or ask someone for help.  Wash your feet with warm water (do not use hot water) and mild soap. Then pat your feet and the areas between your toes until they are completely dry. Do not soak your feet as this can dry your skin.  Apply a moisturizing lotion or petroleum jelly (that does not contain alcohol and is unscented) to the skin on your feet and to dry, brittle toenails. Do not apply lotion between your toes.  Trim your toenails straight across. Do not dig under them or around the cuticle. File the edges of your nails with an emery board or nail file.  Do not cut corns or calluses or try to remove them with medicine.  Wear clean socks or stockings every day. Make sure they are not too tight. Do not wear knee-high stockings since they may decrease blood flow to your legs.  Wear shoes that fit properly and have enough cushioning. To break in new shoes, wear them for just a few hours a day. This prevents you from injuring your feet. Always look in your shoes before you put them on to be sure there are no objects inside.  Do not cross your legs. This may decrease the blood flow to your feet.  If you find a minor scrape,  cut, or break in the skin on your feet, keep it and the skin around it clean and dry. These areas may be cleansed with mild soap and water. Do not cleanse the area with peroxide, alcohol, or iodine.  When you remove an adhesive bandage, be sure not to damage the skin around it.  If you have a wound, look at it several times a day to make sure it is healing.  Do not use heating pads or hot water bottles. They may burn your skin. If you have lost feeling in your feet or legs, you may not know it is happening until it is too late.  Make sure your health care provider performs a complete foot exam at least annually or more often if you have foot problems. Report any cuts, sores, or bruises to your health care provider immediately. SEEK MEDICAL CARE IF:   You have an injury that is not healing.  You have cuts or breaks in the skin.  You have an ingrown nail.  You notice redness on your legs or feet.  You feel burning or tingling in your legs or feet.  You have pain or cramps in your legs and feet.  Your legs or feet are numb.  Your feet always feel cold. SEEK IMMEDIATE MEDICAL CARE IF:   There is increasing redness,   swelling, or pain in or around a wound.  There is a red line that goes up your leg.  Pus is coming from a wound.  You develop a fever or as directed by your health care provider.  You notice a bad smell coming from an ulcer or wound. Document Released: 02/09/2000 Document Revised: 10/14/2012 Document Reviewed: 07/21/2012 Exit[employer] Patient Information 2015 [employer]. This information is not intended to replace advice given to you by your health care provider. Make sure you discuss any questions you have with your health care provider.  

## 2014-03-02 ENCOUNTER — Telehealth: Payer: Self-pay | Admitting: *Deleted

## 2014-03-02 NOTE — Telephone Encounter (Signed)
Pt states she stepped on a diabetic needle and it may be in her heel, needs an appt.

## 2014-03-03 ENCOUNTER — Ambulatory Visit (INDEPENDENT_AMBULATORY_CARE_PROVIDER_SITE_OTHER): Payer: Medicare Other

## 2014-03-03 VITALS — BP 149/86 | HR 72 | Resp 12

## 2014-03-03 DIAGNOSIS — R52 Pain, unspecified: Secondary | ICD-10-CM

## 2014-03-03 DIAGNOSIS — S91332A Puncture wound without foreign body, left foot, initial encounter: Secondary | ICD-10-CM

## 2014-03-03 DIAGNOSIS — E1140 Type 2 diabetes mellitus with diabetic neuropathy, unspecified: Secondary | ICD-10-CM

## 2014-03-03 NOTE — Progress Notes (Signed)
   Subjective:    Patient ID: SARHA BARTELT, female    DOB: May 15, 1931, 81 y.o.   MRN: [id number]  HPI  PT STATED STEP AND PULLED OUT AN INSULIN NEEDLE OUT OF THE LT BOTTOM OF THE HEEL ABOUT 1 WEEK AGO AND STILL SORE. PT THINK HEEL STILL HAVE LITTLE PIECE OF NEEDLE LEFT AND GET AGGARAVATED BY PRESSURE. TRIED NEOSPORIN BUT NO HELP.  Review of Systems  Musculoskeletal: Positive for gait problem.  All other systems reviewed and are negative.      Objective:   Physical Exam 81 year old white female well-developed well-nourished oriented 3 presents this time 2 days ago she stepped on a needle apparently breaks offer needles with the device after using an up may have fallen on the floor gotten her shoe shouldn't pulling a small portion of prepared to be other diabetic insulin needle from her lateral aspect of her left heel. Continues to have some pain tenderness to did bleed initially when this happened no signs of a setting size lymphangitis no discharge no drainage no significant ecchymosis noted her third toe right foot or left foot is wrapped with some paper tape does have hammertoe deformities with keratoses and has mycotic dystrophic friable nails patient readily has palliative care for nails and calluses last treated proxy 1 month ago. This lesion happened or puncture wounds the foot happened within the past 2 days. There is no active sign of infection       Assessment & Plan:  Assessment based on no findings on x-rays other than generalized osteopenic changes no foreign bodies were identified the puncture wound appears to healed or closed up well no discharge drainage no signs of infection no open wounds no edema erythema although there is tenderness in the heel is may be more of a paresthesia rather than the actual puncture wound itself. Monitor for any other changes otherwise patient advised to maintain cushioned athletic or walking shoes cushioned socks at all times avoid going  barefoot also suggested not breaking her needles just putting a hole into a closed plastic container. Follow-up in the future keep her to 2 month follow-up appointment for diabetic foot and nail care  [name]

## 2014-03-14 DIAGNOSIS — E785 Hyperlipidemia, unspecified: Secondary | ICD-10-CM | POA: Diagnosis not present

## 2014-03-14 DIAGNOSIS — I2510 Atherosclerotic heart disease of native coronary artery without angina pectoris: Secondary | ICD-10-CM | POA: Diagnosis not present

## 2014-03-14 DIAGNOSIS — Z79899 Other long term (current) drug therapy: Secondary | ICD-10-CM | POA: Diagnosis not present

## 2014-03-14 DIAGNOSIS — G4730 Sleep apnea, unspecified: Secondary | ICD-10-CM | POA: Diagnosis not present

## 2014-03-14 DIAGNOSIS — I10 Essential (primary) hypertension: Secondary | ICD-10-CM | POA: Diagnosis not present

## 2014-03-14 DIAGNOSIS — E119 Type 2 diabetes mellitus without complications: Secondary | ICD-10-CM | POA: Diagnosis not present

## 2014-03-14 DIAGNOSIS — E669 Obesity, unspecified: Secondary | ICD-10-CM | POA: Diagnosis not present

## 2014-03-23 DIAGNOSIS — N3090 Cystitis, unspecified without hematuria: Secondary | ICD-10-CM | POA: Diagnosis not present

## 2014-04-05 DIAGNOSIS — G4733 Obstructive sleep apnea (adult) (pediatric): Secondary | ICD-10-CM | POA: Diagnosis not present

## 2014-04-05 DIAGNOSIS — J309 Allergic rhinitis, unspecified: Secondary | ICD-10-CM | POA: Diagnosis not present

## 2014-04-05 DIAGNOSIS — R5383 Other fatigue: Secondary | ICD-10-CM | POA: Diagnosis not present

## 2014-04-05 DIAGNOSIS — J4520 Mild intermittent asthma, uncomplicated: Secondary | ICD-10-CM | POA: Diagnosis not present

## 2014-04-14 DIAGNOSIS — L03312 Cellulitis of back [any part except buttock]: Secondary | ICD-10-CM | POA: Diagnosis not present

## 2014-04-14 DIAGNOSIS — L0390 Cellulitis, unspecified: Secondary | ICD-10-CM | POA: Diagnosis not present

## 2014-04-18 DIAGNOSIS — N3090 Cystitis, unspecified without hematuria: Secondary | ICD-10-CM | POA: Diagnosis not present

## 2014-05-02 DIAGNOSIS — E1165 Type 2 diabetes mellitus with hyperglycemia: Secondary | ICD-10-CM | POA: Diagnosis not present

## 2014-05-09 DIAGNOSIS — E1149 Type 2 diabetes mellitus with other diabetic neurological complication: Secondary | ICD-10-CM | POA: Diagnosis not present

## 2014-05-09 DIAGNOSIS — Z9181 History of falling: Secondary | ICD-10-CM | POA: Diagnosis not present

## 2014-05-09 DIAGNOSIS — Z1389 Encounter for screening for other disorder: Secondary | ICD-10-CM | POA: Diagnosis not present

## 2014-05-09 DIAGNOSIS — E1165 Type 2 diabetes mellitus with hyperglycemia: Secondary | ICD-10-CM | POA: Diagnosis not present

## 2014-05-16 ENCOUNTER — Ambulatory Visit (INDEPENDENT_AMBULATORY_CARE_PROVIDER_SITE_OTHER): Payer: Medicare Other

## 2014-05-16 VITALS — BP 119/64 | HR 92 | Resp 18

## 2014-05-16 DIAGNOSIS — M2040 Other hammer toe(s) (acquired), unspecified foot: Secondary | ICD-10-CM | POA: Diagnosis not present

## 2014-05-16 DIAGNOSIS — L603 Nail dystrophy: Secondary | ICD-10-CM

## 2014-05-16 DIAGNOSIS — M1990 Unspecified osteoarthritis, unspecified site: Secondary | ICD-10-CM

## 2014-05-16 DIAGNOSIS — Q828 Other specified congenital malformations of skin: Secondary | ICD-10-CM

## 2014-05-16 DIAGNOSIS — E1140 Type 2 diabetes mellitus with diabetic neuropathy, unspecified: Secondary | ICD-10-CM | POA: Diagnosis not present

## 2014-05-16 NOTE — Progress Notes (Signed)
   Subjective:    Patient ID: Shelia Sullivan, female    DOB: 25-May-1931, 81 y.o.   MRN: [id number]  HPI I AM HERE TO GET MY TOENAILS TRIMMED UP    Review of Systems no new findings or systemic changes noted    Objective:   Physical Exam neurovascular status is unchanged pedal pulses are palpable DP +2 PT thready one over 4 bilateral Refill time 3 seconds all digits epicritic and proprioceptive sensations grossly diminished on Semmes Weinstein the forefoot digits and arch patient is keratoses due to rigid digital contractures and arthritic toes third digits bilateral distal clavi are noted with no open wounds no ulcers there is hemorrhage a keratoses noted has thick brittle crumbly criptotic nails 2 through 5 bilateral both hallux nails previously avulsed. Actually is noted no open wounds no ulcers no secondary infections. Recently pocket had a trauma to her left great toe she may have broken or contusion however there is no edema no ecchymosis no CVA findings at hallux is rectus lesser digits extremely contracted.      Assessment & Plan:  Assessment this time is advised is with diabetes and history peripheral neuropathy and angiopathy. No open wounds no ulcers this time multiple keratoses distal clavus third bilateral debrided also multiple dystrophic friable criptotic nails 2 through 5 bilateral debrided and the presence of diabetes and complication return for future palliative care every 3 months as recommended. Patient wearing appropriate diabetic shoes at all times  [name]

## 2014-05-25 DIAGNOSIS — G4733 Obstructive sleep apnea (adult) (pediatric): Secondary | ICD-10-CM | POA: Diagnosis not present

## 2014-05-25 DIAGNOSIS — R5383 Other fatigue: Secondary | ICD-10-CM | POA: Diagnosis not present

## 2014-05-25 DIAGNOSIS — J309 Allergic rhinitis, unspecified: Secondary | ICD-10-CM | POA: Diagnosis not present

## 2014-05-25 DIAGNOSIS — J4520 Mild intermittent asthma, uncomplicated: Secondary | ICD-10-CM | POA: Diagnosis not present

## 2014-05-30 DIAGNOSIS — G4733 Obstructive sleep apnea (adult) (pediatric): Secondary | ICD-10-CM | POA: Diagnosis not present

## 2014-05-31 DIAGNOSIS — N3090 Cystitis, unspecified without hematuria: Secondary | ICD-10-CM | POA: Diagnosis not present

## 2014-05-31 DIAGNOSIS — N952 Postmenopausal atrophic vaginitis: Secondary | ICD-10-CM | POA: Diagnosis not present

## 2014-06-14 DIAGNOSIS — Z1231 Encounter for screening mammogram for malignant neoplasm of breast: Secondary | ICD-10-CM | POA: Diagnosis not present

## 2014-06-21 DIAGNOSIS — Z6835 Body mass index (BMI) 35.0-35.9, adult: Secondary | ICD-10-CM | POA: Diagnosis not present

## 2014-06-21 DIAGNOSIS — E669 Obesity, unspecified: Secondary | ICD-10-CM | POA: Diagnosis not present

## 2014-06-21 DIAGNOSIS — N6019 Diffuse cystic mastopathy of unspecified breast: Secondary | ICD-10-CM | POA: Diagnosis not present

## 2014-06-28 DIAGNOSIS — G4733 Obstructive sleep apnea (adult) (pediatric): Secondary | ICD-10-CM | POA: Diagnosis not present

## 2014-06-28 DIAGNOSIS — R5383 Other fatigue: Secondary | ICD-10-CM | POA: Diagnosis not present

## 2014-06-28 DIAGNOSIS — J309 Allergic rhinitis, unspecified: Secondary | ICD-10-CM | POA: Diagnosis not present

## 2014-06-28 DIAGNOSIS — J4521 Mild intermittent asthma with (acute) exacerbation: Secondary | ICD-10-CM | POA: Diagnosis not present

## 2014-07-01 DIAGNOSIS — R5383 Other fatigue: Secondary | ICD-10-CM | POA: Diagnosis not present

## 2014-07-01 DIAGNOSIS — J4520 Mild intermittent asthma, uncomplicated: Secondary | ICD-10-CM | POA: Diagnosis not present

## 2014-07-01 DIAGNOSIS — G4733 Obstructive sleep apnea (adult) (pediatric): Secondary | ICD-10-CM | POA: Diagnosis not present

## 2014-07-01 DIAGNOSIS — J309 Allergic rhinitis, unspecified: Secondary | ICD-10-CM | POA: Diagnosis not present

## 2014-07-04 DIAGNOSIS — G4733 Obstructive sleep apnea (adult) (pediatric): Secondary | ICD-10-CM | POA: Diagnosis not present

## 2014-07-04 DIAGNOSIS — J309 Allergic rhinitis, unspecified: Secondary | ICD-10-CM | POA: Diagnosis not present

## 2014-07-04 DIAGNOSIS — J4520 Mild intermittent asthma, uncomplicated: Secondary | ICD-10-CM | POA: Diagnosis not present

## 2014-07-04 DIAGNOSIS — R5383 Other fatigue: Secondary | ICD-10-CM | POA: Diagnosis not present

## 2014-07-14 DIAGNOSIS — N3080 Other cystitis without hematuria: Secondary | ICD-10-CM | POA: Diagnosis not present

## 2014-07-26 DIAGNOSIS — G4733 Obstructive sleep apnea (adult) (pediatric): Secondary | ICD-10-CM | POA: Diagnosis not present

## 2014-07-26 DIAGNOSIS — R5383 Other fatigue: Secondary | ICD-10-CM | POA: Diagnosis not present

## 2014-07-26 DIAGNOSIS — J4520 Mild intermittent asthma, uncomplicated: Secondary | ICD-10-CM | POA: Diagnosis not present

## 2014-07-26 DIAGNOSIS — J309 Allergic rhinitis, unspecified: Secondary | ICD-10-CM | POA: Diagnosis not present

## 2014-07-27 DIAGNOSIS — J4520 Mild intermittent asthma, uncomplicated: Secondary | ICD-10-CM | POA: Diagnosis not present

## 2014-07-27 DIAGNOSIS — J3089 Other allergic rhinitis: Secondary | ICD-10-CM | POA: Diagnosis not present

## 2014-07-27 DIAGNOSIS — R5383 Other fatigue: Secondary | ICD-10-CM | POA: Diagnosis not present

## 2014-07-27 DIAGNOSIS — G4733 Obstructive sleep apnea (adult) (pediatric): Secondary | ICD-10-CM | POA: Diagnosis not present

## 2014-08-02 DIAGNOSIS — J4520 Mild intermittent asthma, uncomplicated: Secondary | ICD-10-CM | POA: Diagnosis not present

## 2014-08-02 DIAGNOSIS — J309 Allergic rhinitis, unspecified: Secondary | ICD-10-CM | POA: Diagnosis not present

## 2014-08-08 DIAGNOSIS — G4733 Obstructive sleep apnea (adult) (pediatric): Secondary | ICD-10-CM | POA: Diagnosis not present

## 2014-08-09 DIAGNOSIS — E1149 Type 2 diabetes mellitus with other diabetic neurological complication: Secondary | ICD-10-CM | POA: Diagnosis not present

## 2014-08-10 DIAGNOSIS — Z0000 Encounter for general adult medical examination without abnormal findings: Secondary | ICD-10-CM | POA: Diagnosis not present

## 2014-08-10 DIAGNOSIS — E1149 Type 2 diabetes mellitus with other diabetic neurological complication: Secondary | ICD-10-CM | POA: Diagnosis not present

## 2014-08-10 DIAGNOSIS — E1165 Type 2 diabetes mellitus with hyperglycemia: Secondary | ICD-10-CM | POA: Diagnosis not present

## 2014-08-11 DIAGNOSIS — I2510 Atherosclerotic heart disease of native coronary artery without angina pectoris: Secondary | ICD-10-CM | POA: Diagnosis not present

## 2014-08-11 DIAGNOSIS — E039 Hypothyroidism, unspecified: Secondary | ICD-10-CM | POA: Diagnosis not present

## 2014-08-11 DIAGNOSIS — E780 Pure hypercholesterolemia: Secondary | ICD-10-CM | POA: Diagnosis not present

## 2014-08-11 DIAGNOSIS — I10 Essential (primary) hypertension: Secondary | ICD-10-CM | POA: Diagnosis not present

## 2014-08-19 ENCOUNTER — Other Ambulatory Visit: Payer: Self-pay | Admitting: *Deleted

## 2014-08-19 NOTE — Patient Outreach (Signed)
[place] [facility]) Care Management  08/19/2014  Shelia Sullivan 11-03-1931 [id number]   Referral from [place] [facility], assigned [name], RN to outreach.  [name]. [place], [place] Management [place] Assistant Phone: [phone] Fax: [phone]

## 2014-08-19 NOTE — Patient Outreach (Signed)
[place] [facility]) Care Management  08/19/2014  Shelia Sullivan 03/05/1931 [id number]  High risk screening telephone call,  attempted to reach patient to discuss [facility] services. Patient not available at this time. Will call back at a later date.  [name], RN, [place] Care Management [phone]

## 2014-08-22 ENCOUNTER — Encounter: Payer: Self-pay | Admitting: Podiatry

## 2014-08-22 ENCOUNTER — Other Ambulatory Visit: Payer: Self-pay | Admitting: *Deleted

## 2014-08-22 ENCOUNTER — Ambulatory Visit (INDEPENDENT_AMBULATORY_CARE_PROVIDER_SITE_OTHER): Payer: Medicare Other | Admitting: Podiatry

## 2014-08-22 ENCOUNTER — Ambulatory Visit: Payer: Medicare Other

## 2014-08-22 VITALS — BP 111/59 | HR 67 | Resp 18

## 2014-08-22 DIAGNOSIS — M79674 Pain in right toe(s): Secondary | ICD-10-CM

## 2014-08-22 DIAGNOSIS — M79675 Pain in left toe(s): Secondary | ICD-10-CM | POA: Diagnosis not present

## 2014-08-22 DIAGNOSIS — M79676 Pain in unspecified toe(s): Secondary | ICD-10-CM

## 2014-08-22 DIAGNOSIS — B351 Tinea unguium: Secondary | ICD-10-CM

## 2014-08-22 DIAGNOSIS — N3080 Other cystitis without hematuria: Secondary | ICD-10-CM | POA: Diagnosis not present

## 2014-08-22 NOTE — Patient Outreach (Signed)
[place] [facility]) Care Management  08/22/2014  Aaliyana Fredericks 1931/11/22 [id number]   High Risk screening : [facility] CM attempted to reach patient to discuss [facility] services. Unable to reach patient, No answer. Will confirm telephone number with MD office and try at a later date  [name], [place], [place] Care Management [phone].

## 2014-08-22 NOTE — Progress Notes (Signed)
Patient ID: Shelia Sullivan, female   DOB: [birth year], 81 y.o.   MRN: [id number] Complaint:  Visit Type: Patient returns to my office for continued preventative foot care services. Complaint: Patient states" my nails have grown long and thick and become painful to walk and wear shoes" Patient has been diagnosed with neuropathy and is taking gabapentin..She presents for preventative foot care services. No changes to ROS  Podiatric Exam: Vascular: dorsalis pedis and posterior tibial pulses are palpable bilateral. Capillary return is immediate. Temperature gradient is WNL. Skin turgor WNL  Sensorium: Normal Semmes Weinstein monofilament test. Normal tactile sensation bilaterally. Nail Exam: Pt has thick disfigured discolored nails with subungual debris noted bilateral entire nail second  through fifth toenails both feet.Ulcer Exam: There is no evidence of ulcer or pre-ulcerative changes or infection. Orthopedic Exam: Muscle tone and strength are WNL. No limitations in general ROM. No crepitus or effusions noted. Foot type and digits show no abnormalities. Bony prominences are unremarkable. Skin: No Porokeratosis. No infection or ulcers.  Distal clavi third toe B/L.  Diagnosis:  Tinea unguium, Pain in right toe, pain in left toes  Treatment & Plan Procedures and Treatment: Consent by patient was obtained for treatment procedures. The patient understood the discussion of treatment and procedures well. All questions were answered thoroughly reviewed. Debridement of mycotic and hypertrophic toenails, 1 through 5 bilateral and clearing of subungual debris. No ulceration, no infection noted.  Return Visit-Office Procedure: Patient instructed to return to the office for a follow up visit 3 months for continued evaluation and treatment.

## 2014-08-23 ENCOUNTER — Other Ambulatory Visit: Payer: Self-pay | Admitting: *Deleted

## 2014-08-23 NOTE — Patient Outreach (Signed)
[place] [facility]) Care Management  08/23/2014  Shelia Sullivan 1931/06/20 [id number]   High risk list screening call: Telephone call to patient discussed and offered [place] Management services. Patient agrees to services.  Subjective : Patient states that she is having trouble getting her A1c down, reports on last provider visit on June 15 that it was 8.9. She reports that the MD made some medication changes and that she is taking all medication as ordered. She checks her blood sugar 2 to 3 times a day.Reports that she doesn't cook much and at times eats frozen meals. Shelia Sullivan lives at home alone, drives short distances such as Shelia Sullivan, her daughter's home that lives nearby and the senior center at times. Shelia Sullivan reports that she has arthritis and occassionally uses a cane, reports 2 falls in the last year.  Assessment : Patient will benefit from diabetes education and management .  Plan : Will refer to community [facility] for diabetes/education and management, Fall risk assessment. Will send in basket message to Shelia Sullivan.Shelia Draft, RN, [place] Care Management [phone]

## 2014-08-24 NOTE — Patient Outreach (Signed)
[place] [facility]) Care Management  08/24/2014  Shelia Sullivan Nov 25, 1931 [id number]   Notification from [name], RN patient accepted [facility] Care Management services.  [name]. [place], [place] Management [place] Assistant Phone: [phone] Fax: [phone]

## 2014-08-30 ENCOUNTER — Encounter: Payer: Self-pay | Admitting: *Deleted

## 2014-08-30 DIAGNOSIS — N3080 Other cystitis without hematuria: Secondary | ICD-10-CM | POA: Diagnosis not present

## 2014-08-30 DIAGNOSIS — N952 Postmenopausal atrophic vaginitis: Secondary | ICD-10-CM | POA: Diagnosis not present

## 2014-08-30 DIAGNOSIS — E119 Type 2 diabetes mellitus without complications: Secondary | ICD-10-CM

## 2014-09-09 DIAGNOSIS — M79643 Pain in unspecified hand: Secondary | ICD-10-CM | POA: Diagnosis not present

## 2014-09-09 DIAGNOSIS — M25551 Pain in right hip: Secondary | ICD-10-CM | POA: Diagnosis not present

## 2014-09-10 DIAGNOSIS — D649 Anemia, unspecified: Secondary | ICD-10-CM | POA: Diagnosis not present

## 2014-09-10 DIAGNOSIS — G4733 Obstructive sleep apnea (adult) (pediatric): Secondary | ICD-10-CM | POA: Diagnosis not present

## 2014-09-10 DIAGNOSIS — K219 Gastro-esophageal reflux disease without esophagitis: Secondary | ICD-10-CM | POA: Diagnosis not present

## 2014-09-10 DIAGNOSIS — G4700 Insomnia, unspecified: Secondary | ICD-10-CM | POA: Diagnosis not present

## 2014-09-14 DIAGNOSIS — R5383 Other fatigue: Secondary | ICD-10-CM | POA: Diagnosis not present

## 2014-09-14 DIAGNOSIS — J309 Allergic rhinitis, unspecified: Secondary | ICD-10-CM | POA: Diagnosis not present

## 2014-09-14 DIAGNOSIS — G4733 Obstructive sleep apnea (adult) (pediatric): Secondary | ICD-10-CM | POA: Diagnosis not present

## 2014-09-14 DIAGNOSIS — J4520 Mild intermittent asthma, uncomplicated: Secondary | ICD-10-CM | POA: Diagnosis not present

## 2014-09-26 DIAGNOSIS — M1611 Unilateral primary osteoarthritis, right hip: Secondary | ICD-10-CM | POA: Diagnosis not present

## 2014-09-26 DIAGNOSIS — M25551 Pain in right hip: Secondary | ICD-10-CM | POA: Diagnosis not present

## 2014-10-05 DIAGNOSIS — M25551 Pain in right hip: Secondary | ICD-10-CM | POA: Diagnosis not present

## 2014-10-05 DIAGNOSIS — M1611 Unilateral primary osteoarthritis, right hip: Secondary | ICD-10-CM | POA: Diagnosis not present

## 2014-10-11 DIAGNOSIS — F419 Anxiety disorder, unspecified: Secondary | ICD-10-CM | POA: Diagnosis not present

## 2014-10-11 DIAGNOSIS — E039 Hypothyroidism, unspecified: Secondary | ICD-10-CM | POA: Diagnosis not present

## 2014-10-11 DIAGNOSIS — E782 Mixed hyperlipidemia: Secondary | ICD-10-CM | POA: Diagnosis not present

## 2014-10-11 DIAGNOSIS — E569 Vitamin deficiency, unspecified: Secondary | ICD-10-CM | POA: Diagnosis not present

## 2014-10-24 DIAGNOSIS — M25551 Pain in right hip: Secondary | ICD-10-CM | POA: Diagnosis not present

## 2014-10-24 DIAGNOSIS — M1611 Unilateral primary osteoarthritis, right hip: Secondary | ICD-10-CM | POA: Diagnosis not present

## 2014-11-21 ENCOUNTER — Ambulatory Visit: Payer: Medicare Other | Admitting: Podiatry

## 2014-11-21 DIAGNOSIS — E782 Mixed hyperlipidemia: Secondary | ICD-10-CM | POA: Diagnosis not present

## 2014-11-21 DIAGNOSIS — E039 Hypothyroidism, unspecified: Secondary | ICD-10-CM | POA: Diagnosis not present

## 2014-11-21 DIAGNOSIS — E119 Type 2 diabetes mellitus without complications: Secondary | ICD-10-CM | POA: Diagnosis not present

## 2014-11-21 DIAGNOSIS — Z23 Encounter for immunization: Secondary | ICD-10-CM | POA: Diagnosis not present

## 2014-11-21 DIAGNOSIS — Z79899 Other long term (current) drug therapy: Secondary | ICD-10-CM | POA: Diagnosis not present

## 2014-11-24 DIAGNOSIS — E1165 Type 2 diabetes mellitus with hyperglycemia: Secondary | ICD-10-CM | POA: Diagnosis not present

## 2014-11-24 DIAGNOSIS — E1149 Type 2 diabetes mellitus with other diabetic neurological complication: Secondary | ICD-10-CM | POA: Diagnosis not present

## 2014-11-28 ENCOUNTER — Encounter: Payer: Self-pay | Admitting: Podiatry

## 2014-11-28 ENCOUNTER — Ambulatory Visit (INDEPENDENT_AMBULATORY_CARE_PROVIDER_SITE_OTHER): Payer: Medicare Other | Admitting: Podiatry

## 2014-11-28 ENCOUNTER — Ambulatory Visit (INDEPENDENT_AMBULATORY_CARE_PROVIDER_SITE_OTHER): Payer: Medicare Other

## 2014-11-28 DIAGNOSIS — M79673 Pain in unspecified foot: Secondary | ICD-10-CM | POA: Diagnosis not present

## 2014-11-28 DIAGNOSIS — R52 Pain, unspecified: Secondary | ICD-10-CM

## 2014-11-28 DIAGNOSIS — E1140 Type 2 diabetes mellitus with diabetic neuropathy, unspecified: Secondary | ICD-10-CM | POA: Diagnosis not present

## 2014-11-28 DIAGNOSIS — B351 Tinea unguium: Secondary | ICD-10-CM

## 2014-11-28 DIAGNOSIS — M19079 Primary osteoarthritis, unspecified ankle and foot: Secondary | ICD-10-CM

## 2014-11-28 NOTE — Progress Notes (Signed)
Patient ID: Shelia Sullivan, female   DOB: 03/14/1931, 81 y.o.   MRN: [id number] Complaint:  Visit Type: Patient returns to my office for continued preventative foot care services. Complaint: Patient states" my nails have grown long and thick and become painful to walk and wear shoes" Patient has been diagnosed with neuropathy and is taking gabapentin..She presents for preventative foot care services. No changes to ROS  Podiatric Exam: Vascular: dorsalis pedis and posterior tibial pulses are palpable bilateral. Capillary return is immediate. Temperature gradient is WNL. Skin turgor WNL  Sensorium: Normal Semmes Weinstein monofilament test. Normal tactile sensation bilaterally. Nail Exam: Pt has thick disfigured discolored nails with subungual debris noted bilateral entire nail second  through fifth toenails both feet.Ulcer Exam: There is no evidence of ulcer or pre-ulcerative changes or infection. Orthopedic Exam: Muscle tone and strength are WNL. No limitations in general ROM. No crepitus or effusions noted. Foot type and digits show no abnormalities. Bony prominences are unremarkable. Pain noted over midfoot B/L. Skin: No Porokeratosis. No infection or ulcers.  Distal clavi third toe B/L.  Diagnosis:  Tinea unguium, Pain in right toe, pain in left toes, Arthritis B/L  Treatment & Plan Procedures and Treatment: Consent by patient was obtained for treatment procedures. The patient understood the discussion of treatment and procedures well. All questions were answered thoroughly reviewed. Debridement of mycotic and hypertrophic toenails, 1 through 5 bilateral and clearing of subungual debris. No ulceration, no infection noted. X-rays taken with no pathology noted except osteopenia. Return Visit-Office Procedure: Patient instructed to return to the office for a follow up visit 3 months for continued evaluation and treatment.

## 2014-11-29 ENCOUNTER — Telehealth: Payer: Self-pay | Admitting: *Deleted

## 2014-11-29 NOTE — Telephone Encounter (Signed)
Pt states she was seen by Dr. Prudence Davidson today, but was not told when to return.  Left message for pt to reschedule for 3 months for foot care, left appt line number.

## 2014-12-19 DIAGNOSIS — G4733 Obstructive sleep apnea (adult) (pediatric): Secondary | ICD-10-CM | POA: Diagnosis not present

## 2014-12-20 DIAGNOSIS — R5383 Other fatigue: Secondary | ICD-10-CM | POA: Diagnosis not present

## 2014-12-20 DIAGNOSIS — G4733 Obstructive sleep apnea (adult) (pediatric): Secondary | ICD-10-CM | POA: Diagnosis not present

## 2014-12-20 DIAGNOSIS — E119 Type 2 diabetes mellitus without complications: Secondary | ICD-10-CM | POA: Diagnosis not present

## 2014-12-20 DIAGNOSIS — J4520 Mild intermittent asthma, uncomplicated: Secondary | ICD-10-CM | POA: Diagnosis not present

## 2014-12-20 DIAGNOSIS — J309 Allergic rhinitis, unspecified: Secondary | ICD-10-CM | POA: Diagnosis not present

## 2015-01-02 DIAGNOSIS — Z6836 Body mass index (BMI) 36.0-36.9, adult: Secondary | ICD-10-CM | POA: Diagnosis not present

## 2015-01-02 DIAGNOSIS — R0789 Other chest pain: Secondary | ICD-10-CM | POA: Diagnosis not present

## 2015-01-02 DIAGNOSIS — G4733 Obstructive sleep apnea (adult) (pediatric): Secondary | ICD-10-CM | POA: Diagnosis not present

## 2015-01-02 DIAGNOSIS — Z79899 Other long term (current) drug therapy: Secondary | ICD-10-CM | POA: Diagnosis not present

## 2015-01-02 DIAGNOSIS — E1165 Type 2 diabetes mellitus with hyperglycemia: Secondary | ICD-10-CM | POA: Diagnosis not present

## 2015-01-02 DIAGNOSIS — E118 Type 2 diabetes mellitus with unspecified complications: Secondary | ICD-10-CM | POA: Diagnosis not present

## 2015-01-02 DIAGNOSIS — E039 Hypothyroidism, unspecified: Secondary | ICD-10-CM | POA: Diagnosis not present

## 2015-01-02 DIAGNOSIS — I10 Essential (primary) hypertension: Secondary | ICD-10-CM | POA: Diagnosis not present

## 2015-01-02 DIAGNOSIS — I252 Old myocardial infarction: Secondary | ICD-10-CM | POA: Diagnosis not present

## 2015-01-02 DIAGNOSIS — E785 Hyperlipidemia, unspecified: Secondary | ICD-10-CM | POA: Diagnosis not present

## 2015-01-02 DIAGNOSIS — Z794 Long term (current) use of insulin: Secondary | ICD-10-CM | POA: Diagnosis not present

## 2015-01-02 DIAGNOSIS — K219 Gastro-esophageal reflux disease without esophagitis: Secondary | ICD-10-CM | POA: Diagnosis not present

## 2015-01-02 DIAGNOSIS — R079 Chest pain, unspecified: Secondary | ICD-10-CM | POA: Diagnosis not present

## 2015-01-03 DIAGNOSIS — I10 Essential (primary) hypertension: Secondary | ICD-10-CM | POA: Diagnosis not present

## 2015-01-03 DIAGNOSIS — E118 Type 2 diabetes mellitus with unspecified complications: Secondary | ICD-10-CM | POA: Diagnosis not present

## 2015-01-03 DIAGNOSIS — R0789 Other chest pain: Secondary | ICD-10-CM | POA: Diagnosis not present

## 2015-01-03 DIAGNOSIS — E785 Hyperlipidemia, unspecified: Secondary | ICD-10-CM | POA: Diagnosis not present

## 2015-01-03 DIAGNOSIS — R079 Chest pain, unspecified: Secondary | ICD-10-CM | POA: Diagnosis not present

## 2015-01-11 DIAGNOSIS — R079 Chest pain, unspecified: Secondary | ICD-10-CM | POA: Diagnosis not present

## 2015-01-11 DIAGNOSIS — E039 Hypothyroidism, unspecified: Secondary | ICD-10-CM | POA: Diagnosis not present

## 2015-01-11 DIAGNOSIS — I2510 Atherosclerotic heart disease of native coronary artery without angina pectoris: Secondary | ICD-10-CM | POA: Diagnosis not present

## 2015-01-11 DIAGNOSIS — E785 Hyperlipidemia, unspecified: Secondary | ICD-10-CM | POA: Diagnosis not present

## 2015-01-11 DIAGNOSIS — E569 Vitamin deficiency, unspecified: Secondary | ICD-10-CM | POA: Diagnosis not present

## 2015-01-12 DIAGNOSIS — M25551 Pain in right hip: Secondary | ICD-10-CM | POA: Diagnosis not present

## 2015-01-12 DIAGNOSIS — M1611 Unilateral primary osteoarthritis, right hip: Secondary | ICD-10-CM | POA: Diagnosis not present

## 2015-02-01 DIAGNOSIS — N3289 Other specified disorders of bladder: Secondary | ICD-10-CM | POA: Diagnosis not present

## 2015-02-01 DIAGNOSIS — N3090 Cystitis, unspecified without hematuria: Secondary | ICD-10-CM | POA: Diagnosis not present

## 2015-03-06 ENCOUNTER — Ambulatory Visit: Payer: Medicare Other | Admitting: Podiatry

## 2015-03-20 ENCOUNTER — Ambulatory Visit: Payer: Medicare Other | Admitting: Podiatry

## 2015-03-22 DIAGNOSIS — G4733 Obstructive sleep apnea (adult) (pediatric): Secondary | ICD-10-CM | POA: Diagnosis not present

## 2015-03-22 DIAGNOSIS — J301 Allergic rhinitis due to pollen: Secondary | ICD-10-CM | POA: Diagnosis not present

## 2015-03-22 DIAGNOSIS — R5383 Other fatigue: Secondary | ICD-10-CM | POA: Diagnosis not present

## 2015-03-22 DIAGNOSIS — J4520 Mild intermittent asthma, uncomplicated: Secondary | ICD-10-CM | POA: Diagnosis not present

## 2015-03-23 DIAGNOSIS — E119 Type 2 diabetes mellitus without complications: Secondary | ICD-10-CM | POA: Diagnosis not present

## 2015-03-23 DIAGNOSIS — G4733 Obstructive sleep apnea (adult) (pediatric): Secondary | ICD-10-CM | POA: Diagnosis not present

## 2015-03-23 DIAGNOSIS — H3530 Unspecified macular degeneration: Secondary | ICD-10-CM | POA: Diagnosis not present

## 2015-03-27 ENCOUNTER — Ambulatory Visit: Payer: Medicare Other | Admitting: Podiatry

## 2015-04-03 ENCOUNTER — Ambulatory Visit (INDEPENDENT_AMBULATORY_CARE_PROVIDER_SITE_OTHER): Payer: Medicare Other | Admitting: Podiatry

## 2015-04-03 ENCOUNTER — Encounter: Payer: Self-pay | Admitting: Podiatry

## 2015-04-03 DIAGNOSIS — E1140 Type 2 diabetes mellitus with diabetic neuropathy, unspecified: Secondary | ICD-10-CM | POA: Diagnosis not present

## 2015-04-03 DIAGNOSIS — M79676 Pain in unspecified toe(s): Secondary | ICD-10-CM | POA: Diagnosis not present

## 2015-04-03 DIAGNOSIS — M79675 Pain in left toe(s): Secondary | ICD-10-CM

## 2015-04-03 DIAGNOSIS — B351 Tinea unguium: Secondary | ICD-10-CM | POA: Diagnosis not present

## 2015-04-03 DIAGNOSIS — M2040 Other hammer toe(s) (acquired), unspecified foot: Secondary | ICD-10-CM

## 2015-04-03 NOTE — Progress Notes (Signed)
Patient ID: KYNDRA EICK, female   DOB: 23-Oct-1931, 82 y.o.   MRN: [id number] Complaint:  Visit Type: Patient returns to my office for continued preventative foot care services. Complaint: Patient states" my nails have grown long and thick and become painful to walk and wear shoes" Patient has been diagnosed with neuropathy and is taking gabapentin..She presents for preventative foot care services. No changes to ROS  Podiatric Exam: Vascular: dorsalis pedis and posterior tibial pulses are palpable bilateral. Capillary return is immediate. Temperature gradient is WNL. Skin turgor WNL  Sensorium: Normal Semmes Weinstein monofilament test. Normal tactile sensation bilaterally. Nail Exam: Pt has thick disfigured discolored nails with subungual debris noted bilateral entire nail second  through fifth toenails both feet.Ulcer Exam: There is no evidence of ulcer or pre-ulcerative changes or infection. Orthopedic Exam: Muscle tone and strength are WNL. No limitations in general ROM. No crepitus or effusions noted. Foot type and digits show no abnormalities. Bony prominences are unremarkable. Pain noted over midfoot B/L. Hammer toes 2,3 B/L Skin: No Porokeratosis. No infection or ulcers.  Distal clavi third toe B/L.  Diagnosis:  Tinea unguium, Pain in right toe, pain in left toes, Arthritis B/L  Treatment & Plan Procedures and Treatment: Consent by patient was obtained for treatment procedures. The patient understood the discussion of treatment and procedures well. All questions were answered thoroughly reviewed. Debridement of mycotic and hypertrophic toenails, 1 through 5 bilateral and clearing of subungual debris. No ulceration, no infection noted. X-rays taken with no pathology noted except osteopenia. Initiate diabetic paperwork due to her contracted hammer toes. Return Visit-Office Procedure: Patient instructed to return to the office for a follow up visit 3 months for continued evaluation and  treatment.

## 2015-04-15 DIAGNOSIS — J209 Acute bronchitis, unspecified: Secondary | ICD-10-CM | POA: Diagnosis not present

## 2015-04-15 DIAGNOSIS — J0100 Acute maxillary sinusitis, unspecified: Secondary | ICD-10-CM | POA: Diagnosis not present

## 2015-04-15 DIAGNOSIS — R062 Wheezing: Secondary | ICD-10-CM | POA: Diagnosis not present

## 2015-05-03 DIAGNOSIS — J4520 Mild intermittent asthma, uncomplicated: Secondary | ICD-10-CM | POA: Diagnosis not present

## 2015-05-03 DIAGNOSIS — J301 Allergic rhinitis due to pollen: Secondary | ICD-10-CM | POA: Diagnosis not present

## 2015-05-03 DIAGNOSIS — G4733 Obstructive sleep apnea (adult) (pediatric): Secondary | ICD-10-CM | POA: Diagnosis not present

## 2015-05-03 DIAGNOSIS — R5383 Other fatigue: Secondary | ICD-10-CM | POA: Diagnosis not present

## 2015-05-10 DIAGNOSIS — N390 Urinary tract infection, site not specified: Secondary | ICD-10-CM | POA: Diagnosis not present

## 2015-05-10 DIAGNOSIS — E1149 Type 2 diabetes mellitus with other diabetic neurological complication: Secondary | ICD-10-CM | POA: Diagnosis not present

## 2015-05-10 DIAGNOSIS — Z79899 Other long term (current) drug therapy: Secondary | ICD-10-CM | POA: Diagnosis not present

## 2015-05-10 DIAGNOSIS — E039 Hypothyroidism, unspecified: Secondary | ICD-10-CM | POA: Diagnosis not present

## 2015-05-17 DIAGNOSIS — K5909 Other constipation: Secondary | ICD-10-CM | POA: Diagnosis not present

## 2015-05-17 DIAGNOSIS — Z1389 Encounter for screening for other disorder: Secondary | ICD-10-CM | POA: Diagnosis not present

## 2015-05-17 DIAGNOSIS — E1140 Type 2 diabetes mellitus with diabetic neuropathy, unspecified: Secondary | ICD-10-CM | POA: Diagnosis not present

## 2015-05-17 IMAGING — CR DG CHEST 1V PORT
1 series · 1 of 1 positions shown · non-contrast
Comparison: August 31, 2013 chest radiograph and September 01, 2013 chest CT

CLINICAL DATA: Respiratory failure

EXAM:
PORTABLE CHEST - 1 VIEW

[AP]
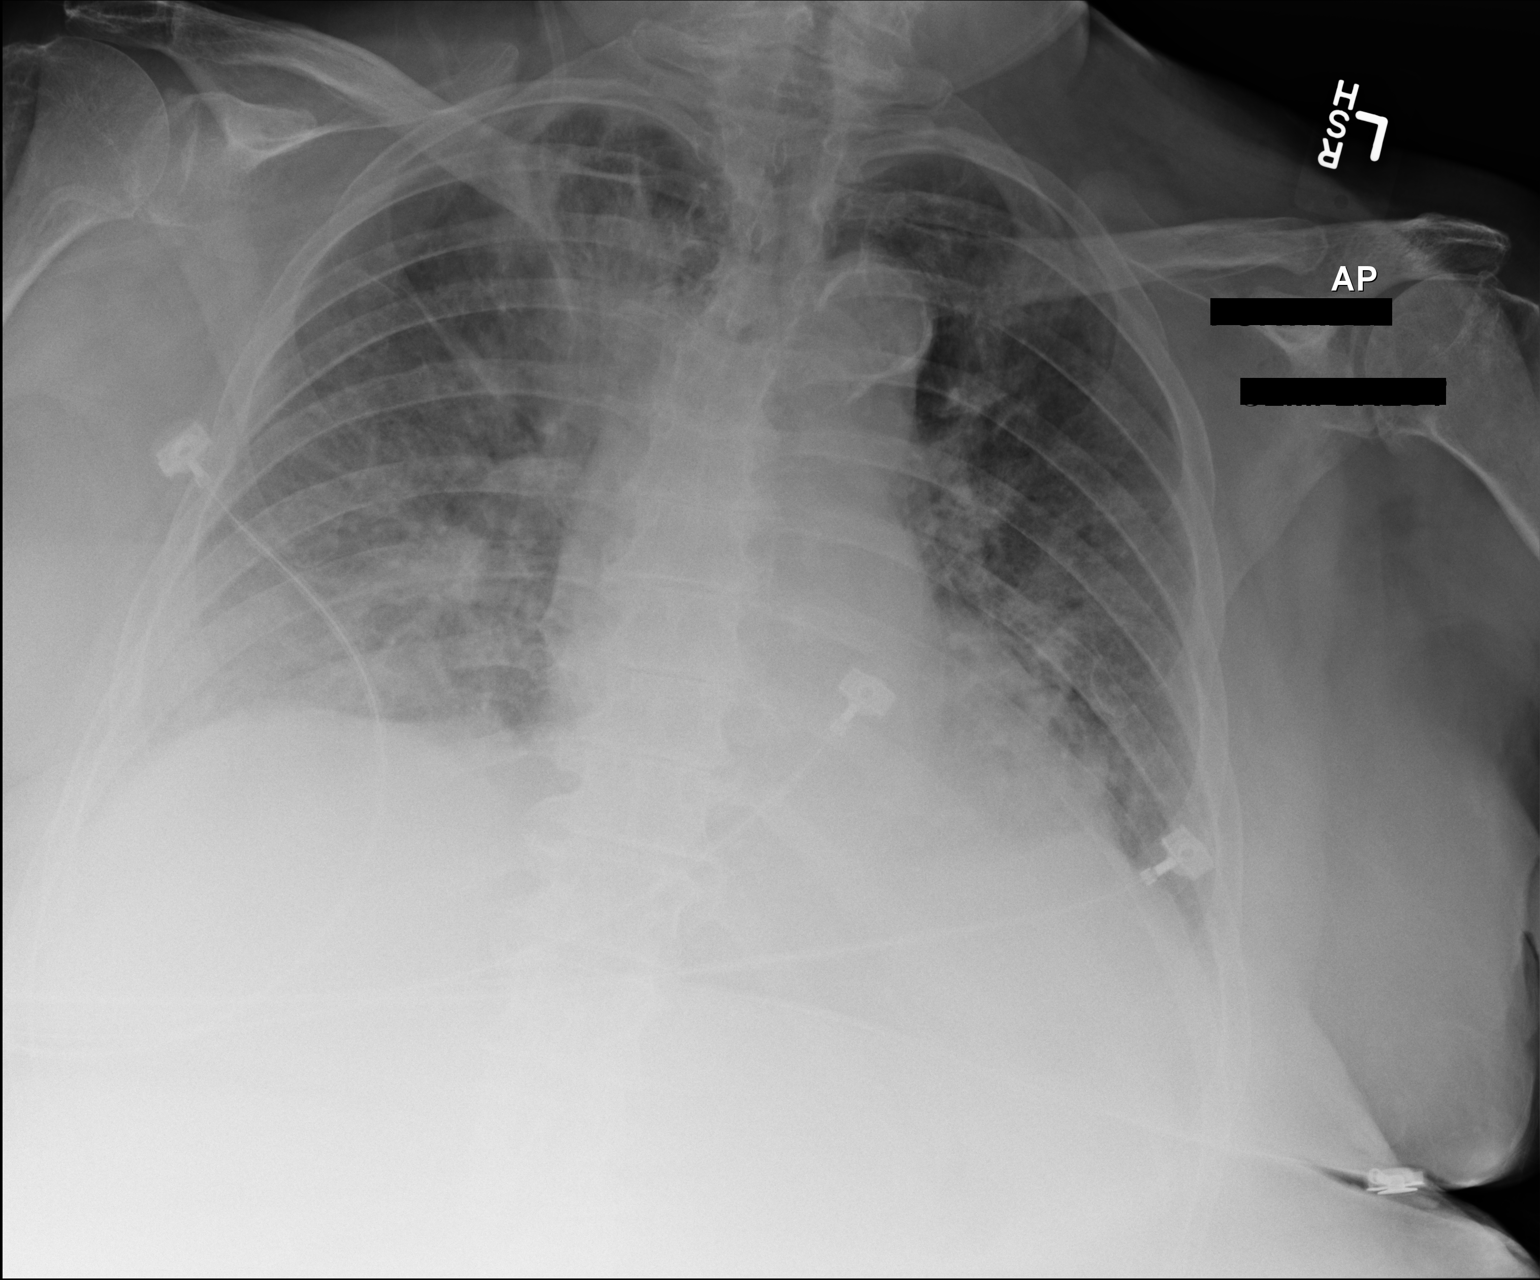

[1 of 1 positions shown; findings below may reference images not displayed]

FINDINGS: Consolidation in the right upper lobe persists. There is persistent
effusion on the right with patchy infiltrate on the right in the mid
and lower lung zones, essentially stable. On the left, there is mild
atelectatic change. There is underlying interstitial edema, mild.
Heart is upper normal in size with pulmonary vascularity within
normal limits. There is atherosclerotic change in aorta. No
adenopathy. No pneumothorax.
IMPRESSION: Persistent right upper lobe consolidation. Right effusion with
patchy infiltrate in the right mid and lower lung zones. On the
left, there is mild atelectatic change. There is underlying
interstitial edema, fairly mild. No change in cardiac silhouette. No
pneumothorax.

## 2015-06-05 DIAGNOSIS — N3090 Cystitis, unspecified without hematuria: Secondary | ICD-10-CM | POA: Diagnosis not present

## 2015-06-14 DIAGNOSIS — M25539 Pain in unspecified wrist: Secondary | ICD-10-CM | POA: Diagnosis not present

## 2015-06-15 DIAGNOSIS — Z1231 Encounter for screening mammogram for malignant neoplasm of breast: Secondary | ICD-10-CM | POA: Diagnosis not present

## 2015-06-21 DIAGNOSIS — M19031 Primary osteoarthritis, right wrist: Secondary | ICD-10-CM | POA: Diagnosis not present

## 2015-06-27 DIAGNOSIS — N6011 Diffuse cystic mastopathy of right breast: Secondary | ICD-10-CM

## 2015-06-27 DIAGNOSIS — Z6841 Body Mass Index (BMI) 40.0 and over, adult: Secondary | ICD-10-CM | POA: Diagnosis not present

## 2015-06-27 DIAGNOSIS — N6012 Diffuse cystic mastopathy of left breast: Secondary | ICD-10-CM | POA: Diagnosis not present

## 2015-06-27 HISTORY — DX: Diffuse cystic mastopathy of right breast: N60.11

## 2015-06-27 HISTORY — DX: Morbid (severe) obesity due to excess calories: E66.01

## 2015-06-29 DIAGNOSIS — H6123 Impacted cerumen, bilateral: Secondary | ICD-10-CM | POA: Diagnosis not present

## 2015-07-03 ENCOUNTER — Encounter: Payer: Self-pay | Admitting: Podiatry

## 2015-07-03 ENCOUNTER — Ambulatory Visit (INDEPENDENT_AMBULATORY_CARE_PROVIDER_SITE_OTHER): Payer: Medicare Other | Admitting: Podiatry

## 2015-07-03 DIAGNOSIS — E1140 Type 2 diabetes mellitus with diabetic neuropathy, unspecified: Secondary | ICD-10-CM | POA: Diagnosis not present

## 2015-07-03 DIAGNOSIS — M79676 Pain in unspecified toe(s): Secondary | ICD-10-CM

## 2015-07-03 DIAGNOSIS — B351 Tinea unguium: Secondary | ICD-10-CM

## 2015-07-03 DIAGNOSIS — M79675 Pain in left toe(s): Secondary | ICD-10-CM

## 2015-07-03 DIAGNOSIS — M2040 Other hammer toe(s) (acquired), unspecified foot: Secondary | ICD-10-CM

## 2015-07-03 NOTE — Progress Notes (Addendum)
Patient ID: Shelia Sullivan, female   DOB: November 05, 1931, 82 y.o.   MRN: [id number] Complaint:  Visit Type: Patient returns to my office for continued preventative foot care services. Complaint: Patient states" my nails have grown long and thick and become painful to walk and wear shoes" Patient has been diagnosed with neuropathy and is taking gabapentin..She presents for preventative foot care services. No changes to ROS  Podiatric Exam: Vascular: dorsalis pedis and posterior tibial pulses are palpable bilateral. Capillary return is immediate. Temperature gradient is WNL. Skin turgor WNL  Sensorium: Normal Semmes Weinstein monofilament test. Normal tactile sensation bilaterally. Nail Exam: Pt has thick disfigured discolored nails with subungual debris noted bilateral entire nail second  through fifth toenails both feet.Ulcer Exam: There is no evidence of ulcer or pre-ulcerative changes or infection. Orthopedic Exam: Muscle tone and strength are WNL. No limitations in general ROM. No crepitus or effusions noted. Foot type and digits show no abnormalities. Bony prominences are unremarkable. Pain noted over midfoot B/L. Hammer toes 2,3 B/L Skin: No Porokeratosis. No infection or ulcers.  Distal clavi third toe B/L.  Diagnosis:  Tinea unguium, Pain in right toe, pain in left toes, Arthritis B/L  Treatment & Plan Procedures and Treatment: Consent by patient was obtained for treatment procedures. The patient understood the discussion of treatment and procedures well. All questions were answered thoroughly reviewed. Debridement of mycotic and hypertrophic toenails, 1 through 5 bilateral and clearing of subungual debris. No ulceration, no infection noted. X-rays taken with no pathology noted except osteopenia.  Return Visit-Office Procedure: Patient instructed to return to the office for a follow up visit 3 months for continued evaluation and treatment.   Gardiner Barefoot DPM

## 2015-07-05 DIAGNOSIS — M7062 Trochanteric bursitis, left hip: Secondary | ICD-10-CM | POA: Diagnosis not present

## 2015-07-20 DIAGNOSIS — E119 Type 2 diabetes mellitus without complications: Secondary | ICD-10-CM | POA: Diagnosis not present

## 2015-07-26 DIAGNOSIS — J40 Bronchitis, not specified as acute or chronic: Secondary | ICD-10-CM | POA: Diagnosis not present

## 2015-07-26 DIAGNOSIS — J329 Chronic sinusitis, unspecified: Secondary | ICD-10-CM | POA: Diagnosis not present

## 2015-07-28 DIAGNOSIS — M7062 Trochanteric bursitis, left hip: Secondary | ICD-10-CM | POA: Diagnosis not present

## 2015-07-28 DIAGNOSIS — M1612 Unilateral primary osteoarthritis, left hip: Secondary | ICD-10-CM | POA: Diagnosis not present

## 2015-08-01 DIAGNOSIS — K5900 Constipation, unspecified: Secondary | ICD-10-CM | POA: Diagnosis not present

## 2015-08-15 DIAGNOSIS — E1140 Type 2 diabetes mellitus with diabetic neuropathy, unspecified: Secondary | ICD-10-CM | POA: Diagnosis not present

## 2015-08-16 DIAGNOSIS — J301 Allergic rhinitis due to pollen: Secondary | ICD-10-CM | POA: Diagnosis not present

## 2015-08-16 DIAGNOSIS — J4520 Mild intermittent asthma, uncomplicated: Secondary | ICD-10-CM | POA: Diagnosis not present

## 2015-08-16 DIAGNOSIS — G4733 Obstructive sleep apnea (adult) (pediatric): Secondary | ICD-10-CM | POA: Diagnosis not present

## 2015-08-16 DIAGNOSIS — R5383 Other fatigue: Secondary | ICD-10-CM | POA: Diagnosis not present

## 2015-08-17 DIAGNOSIS — Z0000 Encounter for general adult medical examination without abnormal findings: Secondary | ICD-10-CM | POA: Diagnosis not present

## 2015-08-17 DIAGNOSIS — E1140 Type 2 diabetes mellitus with diabetic neuropathy, unspecified: Secondary | ICD-10-CM | POA: Diagnosis not present

## 2015-08-17 DIAGNOSIS — K5901 Slow transit constipation: Secondary | ICD-10-CM | POA: Diagnosis not present

## 2015-08-17 DIAGNOSIS — G4733 Obstructive sleep apnea (adult) (pediatric): Secondary | ICD-10-CM | POA: Diagnosis not present

## 2015-08-22 DIAGNOSIS — G4733 Obstructive sleep apnea (adult) (pediatric): Secondary | ICD-10-CM | POA: Diagnosis not present

## 2015-08-31 DIAGNOSIS — N952 Postmenopausal atrophic vaginitis: Secondary | ICD-10-CM | POA: Diagnosis not present

## 2015-08-31 DIAGNOSIS — N3090 Cystitis, unspecified without hematuria: Secondary | ICD-10-CM | POA: Diagnosis not present

## 2015-08-31 DIAGNOSIS — K5900 Constipation, unspecified: Secondary | ICD-10-CM | POA: Diagnosis not present

## 2015-09-04 DIAGNOSIS — K625 Hemorrhage of anus and rectum: Secondary | ICD-10-CM | POA: Diagnosis not present

## 2015-09-04 DIAGNOSIS — D126 Benign neoplasm of colon, unspecified: Secondary | ICD-10-CM | POA: Diagnosis not present

## 2015-09-04 DIAGNOSIS — K635 Polyp of colon: Secondary | ICD-10-CM | POA: Diagnosis not present

## 2015-09-04 DIAGNOSIS — R194 Change in bowel habit: Secondary | ICD-10-CM | POA: Diagnosis not present

## 2015-10-09 ENCOUNTER — Ambulatory Visit (INDEPENDENT_AMBULATORY_CARE_PROVIDER_SITE_OTHER): Payer: Medicare Other | Admitting: Podiatry

## 2015-10-09 ENCOUNTER — Encounter: Payer: Self-pay | Admitting: Podiatry

## 2015-10-09 DIAGNOSIS — M79675 Pain in left toe(s): Secondary | ICD-10-CM | POA: Diagnosis not present

## 2015-10-09 DIAGNOSIS — M79676 Pain in unspecified toe(s): Secondary | ICD-10-CM | POA: Diagnosis not present

## 2015-10-09 DIAGNOSIS — E1140 Type 2 diabetes mellitus with diabetic neuropathy, unspecified: Secondary | ICD-10-CM

## 2015-10-09 DIAGNOSIS — B351 Tinea unguium: Secondary | ICD-10-CM | POA: Diagnosis not present

## 2015-10-09 DIAGNOSIS — M2040 Other hammer toe(s) (acquired), unspecified foot: Secondary | ICD-10-CM

## 2015-10-09 DIAGNOSIS — N390 Urinary tract infection, site not specified: Secondary | ICD-10-CM | POA: Diagnosis not present

## 2015-10-09 NOTE — Progress Notes (Signed)
Patient ID: Shelia Sullivan, female   DOB: [birth year], 82 y.o.   MRN: [id number] Complaint:  Visit Type: Patient returns to my office for continued preventative foot care services. Complaint: Patient states" my nails have grown long and thick and become painful to walk and wear shoes" Patient has been diagnosed with neuropathy and is taking gabapentin..She presents for preventative foot care services. No changes to ROS  Podiatric Exam: Vascular: dorsalis pedis and posterior tibial pulses are palpable bilateral. Capillary return is immediate. Temperature gradient is WNL. Skin turgor WNL  Sensorium: Normal Semmes Weinstein monofilament test. Normal tactile sensation bilaterally. Nail Exam: Pt has thick disfigured discolored nails with subungual debris noted bilateral entire nail second  through fifth toenails both feet.Ulcer Exam: There is no evidence of ulcer or pre-ulcerative changes or infection. Orthopedic Exam: Muscle tone and strength are WNL. No limitations in general ROM. No crepitus or effusions noted. Foot type and digits show no abnormalities. Bony prominences are unremarkable. Pain noted over midfoot B/L. Hammer toes 2,3 B/L Skin: No Porokeratosis. No infection or ulcers.  Distal clavi third toe B/L.  Diagnosis:  Tinea unguium, Pain in right toe, pain in left toes, Arthritis B/L  Treatment & Plan Procedures and Treatment: Consent by patient was obtained for treatment procedures. The patient understood the discussion of treatment and procedures well. All questions were answered thoroughly reviewed. Debridement of mycotic and hypertrophic toenails, 1 through 5 bilateral and clearing of subungual debris. No ulceration, no infection noted. X-rays taken with no pathology noted except osteopenia.  Return Visit-Office Procedure: Patient instructed to return to the office for a follow up visit 3 months for continued evaluation and treatment.   Gardiner Barefoot DPM

## 2015-10-13 DIAGNOSIS — F418 Other specified anxiety disorders: Secondary | ICD-10-CM | POA: Diagnosis not present

## 2015-10-13 DIAGNOSIS — E7800 Pure hypercholesterolemia, unspecified: Secondary | ICD-10-CM | POA: Diagnosis not present

## 2015-10-13 DIAGNOSIS — M1990 Unspecified osteoarthritis, unspecified site: Secondary | ICD-10-CM | POA: Diagnosis not present

## 2015-10-13 DIAGNOSIS — I10 Essential (primary) hypertension: Secondary | ICD-10-CM | POA: Diagnosis not present

## 2015-10-13 DIAGNOSIS — E039 Hypothyroidism, unspecified: Secondary | ICD-10-CM | POA: Diagnosis not present

## 2015-10-13 DIAGNOSIS — J449 Chronic obstructive pulmonary disease, unspecified: Secondary | ICD-10-CM | POA: Diagnosis not present

## 2015-10-13 DIAGNOSIS — E785 Hyperlipidemia, unspecified: Secondary | ICD-10-CM | POA: Diagnosis not present

## 2015-10-13 DIAGNOSIS — E1169 Type 2 diabetes mellitus with other specified complication: Secondary | ICD-10-CM | POA: Diagnosis not present

## 2015-10-13 DIAGNOSIS — Z87442 Personal history of urinary calculi: Secondary | ICD-10-CM | POA: Diagnosis not present

## 2015-10-18 DIAGNOSIS — E119 Type 2 diabetes mellitus without complications: Secondary | ICD-10-CM | POA: Diagnosis not present

## 2015-10-25 DIAGNOSIS — G8929 Other chronic pain: Secondary | ICD-10-CM | POA: Diagnosis not present

## 2015-10-25 DIAGNOSIS — R319 Hematuria, unspecified: Secondary | ICD-10-CM | POA: Diagnosis not present

## 2015-11-08 ENCOUNTER — Ambulatory Visit: Payer: Medicare Other

## 2015-11-13 DIAGNOSIS — E119 Type 2 diabetes mellitus without complications: Secondary | ICD-10-CM | POA: Diagnosis not present

## 2015-11-15 DIAGNOSIS — J4520 Mild intermittent asthma, uncomplicated: Secondary | ICD-10-CM | POA: Diagnosis not present

## 2015-11-15 DIAGNOSIS — M545 Low back pain: Secondary | ICD-10-CM | POA: Diagnosis not present

## 2015-11-15 DIAGNOSIS — Z1389 Encounter for screening for other disorder: Secondary | ICD-10-CM | POA: Diagnosis not present

## 2015-11-15 DIAGNOSIS — J45909 Unspecified asthma, uncomplicated: Secondary | ICD-10-CM | POA: Diagnosis not present

## 2015-11-15 DIAGNOSIS — R5383 Other fatigue: Secondary | ICD-10-CM | POA: Diagnosis not present

## 2015-11-15 DIAGNOSIS — M1611 Unilateral primary osteoarthritis, right hip: Secondary | ICD-10-CM | POA: Diagnosis not present

## 2015-11-15 DIAGNOSIS — G4733 Obstructive sleep apnea (adult) (pediatric): Secondary | ICD-10-CM | POA: Diagnosis not present

## 2015-11-15 DIAGNOSIS — G894 Chronic pain syndrome: Secondary | ICD-10-CM | POA: Diagnosis not present

## 2015-11-15 DIAGNOSIS — M5090 Cervical disc disorder, unspecified, unspecified cervical region: Secondary | ICD-10-CM | POA: Diagnosis not present

## 2015-11-18 DIAGNOSIS — J302 Other seasonal allergic rhinitis: Secondary | ICD-10-CM | POA: Diagnosis not present

## 2015-11-21 DIAGNOSIS — G4733 Obstructive sleep apnea (adult) (pediatric): Secondary | ICD-10-CM | POA: Diagnosis not present

## 2015-11-21 DIAGNOSIS — R5383 Other fatigue: Secondary | ICD-10-CM | POA: Diagnosis not present

## 2015-11-21 DIAGNOSIS — J301 Allergic rhinitis due to pollen: Secondary | ICD-10-CM | POA: Diagnosis not present

## 2015-11-21 DIAGNOSIS — J4520 Mild intermittent asthma, uncomplicated: Secondary | ICD-10-CM | POA: Diagnosis not present

## 2015-11-28 DIAGNOSIS — M4807 Spinal stenosis, lumbosacral region: Secondary | ICD-10-CM | POA: Diagnosis not present

## 2015-11-28 DIAGNOSIS — M5124 Other intervertebral disc displacement, thoracic region: Secondary | ICD-10-CM | POA: Diagnosis not present

## 2015-11-28 DIAGNOSIS — M5136 Other intervertebral disc degeneration, lumbar region: Secondary | ICD-10-CM | POA: Diagnosis not present

## 2015-11-28 DIAGNOSIS — M2570 Osteophyte, unspecified joint: Secondary | ICD-10-CM | POA: Diagnosis not present

## 2015-11-28 DIAGNOSIS — M545 Low back pain: Secondary | ICD-10-CM | POA: Diagnosis not present

## 2015-12-04 DIAGNOSIS — N390 Urinary tract infection, site not specified: Secondary | ICD-10-CM | POA: Diagnosis not present

## 2015-12-04 DIAGNOSIS — Z23 Encounter for immunization: Secondary | ICD-10-CM | POA: Diagnosis not present

## 2015-12-11 DIAGNOSIS — G4733 Obstructive sleep apnea (adult) (pediatric): Secondary | ICD-10-CM | POA: Diagnosis not present

## 2015-12-18 DIAGNOSIS — J4520 Mild intermittent asthma, uncomplicated: Secondary | ICD-10-CM | POA: Diagnosis not present

## 2015-12-18 DIAGNOSIS — R5383 Other fatigue: Secondary | ICD-10-CM | POA: Diagnosis not present

## 2015-12-18 DIAGNOSIS — J301 Allergic rhinitis due to pollen: Secondary | ICD-10-CM | POA: Diagnosis not present

## 2015-12-18 DIAGNOSIS — G4733 Obstructive sleep apnea (adult) (pediatric): Secondary | ICD-10-CM | POA: Diagnosis not present

## 2015-12-18 DIAGNOSIS — J189 Pneumonia, unspecified organism: Secondary | ICD-10-CM | POA: Diagnosis not present

## 2015-12-19 DIAGNOSIS — R300 Dysuria: Secondary | ICD-10-CM | POA: Diagnosis not present

## 2016-01-03 ENCOUNTER — Encounter: Payer: Medicare Other | Admitting: Sports Medicine

## 2016-01-03 ENCOUNTER — Ambulatory Visit (INDEPENDENT_AMBULATORY_CARE_PROVIDER_SITE_OTHER): Payer: Medicare Other | Admitting: Sports Medicine

## 2016-01-03 ENCOUNTER — Encounter: Payer: Self-pay | Admitting: Sports Medicine

## 2016-01-03 DIAGNOSIS — I739 Peripheral vascular disease, unspecified: Secondary | ICD-10-CM

## 2016-01-03 DIAGNOSIS — E1140 Type 2 diabetes mellitus with diabetic neuropathy, unspecified: Secondary | ICD-10-CM | POA: Diagnosis not present

## 2016-01-03 DIAGNOSIS — M79675 Pain in left toe(s): Secondary | ICD-10-CM

## 2016-01-03 DIAGNOSIS — R29898 Other symptoms and signs involving the musculoskeletal system: Secondary | ICD-10-CM

## 2016-01-03 DIAGNOSIS — B351 Tinea unguium: Secondary | ICD-10-CM

## 2016-01-03 DIAGNOSIS — M79674 Pain in right toe(s): Secondary | ICD-10-CM

## 2016-01-03 DIAGNOSIS — M2040 Other hammer toe(s) (acquired), unspecified foot: Secondary | ICD-10-CM

## 2016-01-03 DIAGNOSIS — L909 Atrophic disorder of skin, unspecified: Secondary | ICD-10-CM

## 2016-01-03 NOTE — Progress Notes (Signed)
Duplicate encounter -Dr. Cannon Kettle  This encounter was created in error - please disregard.

## 2016-01-03 NOTE — Progress Notes (Signed)
Subjective: Shelia Sullivan is a 82 y.o. female patient with history of diabetes who presents to office today complaining of long, painful nails  while ambulating in shoes; unable to trim. Patient states that the glucose reading this morning was fine. Patient denies any new changes in medication or new problems. Patient denies any new cramping, numbness, burning or tingling in the legs. Has neuropathy on Gabapentin.  Patient is also here for pick up of diabetic shoes.   There are no active problems to display for this patient.  Current Outpatient Prescriptions on File Prior to Visit  Medication Sig Dispense Refill  . ADVAIR DISKUS 250-50 MCG/DOSE AEPB     . carvedilol (COREG) 6.25 MG tablet     . cefdinir (OMNICEF) 300 MG capsule     . ciprofloxacin (CILOXAN) 0.3 % ophthalmic solution     . diclofenac sodium (VOLTAREN) 1 % GEL     . DULoxetine (CYMBALTA) 60 MG capsule     . eszopiclone (LUNESTA) 2 MG TABS tablet     . furosemide (LASIX) 20 MG tablet     . gabapentin (NEURONTIN) 800 MG tablet Take 800 mg by mouth 3 (three) times daily.    Marland Kitchen HYDROcodone-acetaminophen (NORCO/VICODIN) 5-325 MG tablet     . LEVEMIR FLEXTOUCH 100 UNIT/ML Pen     . lisinopril (PRINIVIL,ZESTRIL) 20 MG tablet     . metFORMIN (GLUCOPHAGE) 850 MG tablet     . nitrofurantoin (MACRODANTIN) 100 MG capsule     . omeprazole (PRILOSEC) 20 MG capsule     . pravastatin (PRAVACHOL) 80 MG tablet     . QUEtiapine (SEROQUEL) 200 MG tablet     . SURE COMFORT PEN NEEDLES 32G X 6 MM MISC     . traMADol (ULTRAM) 50 MG tablet     . TRULICITY 0.30 SP/2.3RA SOPN      No current facility-administered medications on file prior to visit.    Allergies  Allergen Reactions  . Codeine   . Nsaids Other (See Comments)    GI Bleeding    No results found for this or any previous visit (from the past 2160 hour(s)).  Objective: General: Patient is awake, alert, and oriented x 3 and in no acute distress.  Cane assisted gait.    Integument: Skin is dry and supple bilateral. Nails are tender, long, thickened and dystrophic with subungual debris, consistent with onychomycosis, 2-5 bilateral.  There is no hallux nails bilateral from previous removal. No signs of infection. No open lesions or preulcerative lesions present bilateral. Remaining integument unremarkable.  Vasculature:  Dorsalis Pedis pulse 1/4 bilateral. Posterior Tibial pulse  0/4 bilateral. Capillary fill time <5 sec 1-5 bilateral. Scant hair growth to the level of the digits.Temperature gradient mildly decreased bilateral. + varicosities present bilateral. Trace edema present bilateral, L>R chronic.   Neurology: The patient has absent sensation measured with a 5.07/10g Semmes Weinstein Monofilament at all pedal sites bilateral . Vibratory sensation diminished bilateral with tuning fork. No Babinski sign present bilateral.   Musculoskeletal: Asymptomatic hammertoe and fat pad atrophy pedal deformities noted bilateral. Muscular strength 5/5 in all lower extremity muscular groups bilateral without pain on range of motion however stiffness likely arthritis present. No tenderness with calf compression bilateral.  Assessment and Plan: Problem List Items Addressed This Visit    None    Visit Diagnoses    Pain due to onychomycosis of toenails of both feet    -  Primary   Type 2 diabetes, controlled, with  neuropathy ([place])       PVD (peripheral vascular disease) (HCC)       Hammer toe, unspecified laterality       Fat pad atrophy of foot          -Examined patient. -Discussed and educated patient on diabetic foot care, especially with  regards to the vascular, neurological and musculoskeletal systems.  -Stressed the importance of good glycemic control and the detriment of not controlling glucose levels in relation to the foot. -Mechanically debrided all nails 2-5 bilateral using sterile nail nipper and filed with dremel without incident  -Patient also met  with [facility]a and diabetic shoes were dispensed with wear/ break in period explained -Answered all patient questions -Patient to return  in 3 months for at risk foot care -Patient advised to call the office if any problems or questions arise in the meantime.  Landis Martins, DPM

## 2016-01-05 DIAGNOSIS — N390 Urinary tract infection, site not specified: Secondary | ICD-10-CM | POA: Diagnosis not present

## 2016-01-05 DIAGNOSIS — M19012 Primary osteoarthritis, left shoulder: Secondary | ICD-10-CM | POA: Diagnosis not present

## 2016-01-08 ENCOUNTER — Ambulatory Visit: Payer: Medicare Other | Admitting: Podiatry

## 2016-01-15 DIAGNOSIS — E039 Hypothyroidism, unspecified: Secondary | ICD-10-CM | POA: Diagnosis not present

## 2016-01-15 DIAGNOSIS — Z87898 Personal history of other specified conditions: Secondary | ICD-10-CM | POA: Diagnosis not present

## 2016-01-15 DIAGNOSIS — I10 Essential (primary) hypertension: Secondary | ICD-10-CM | POA: Diagnosis not present

## 2016-01-15 DIAGNOSIS — E785 Hyperlipidemia, unspecified: Secondary | ICD-10-CM | POA: Diagnosis not present

## 2016-01-15 DIAGNOSIS — Z87440 Personal history of urinary (tract) infections: Secondary | ICD-10-CM | POA: Diagnosis not present

## 2016-01-15 DIAGNOSIS — F418 Other specified anxiety disorders: Secondary | ICD-10-CM | POA: Diagnosis not present

## 2016-01-15 DIAGNOSIS — E1169 Type 2 diabetes mellitus with other specified complication: Secondary | ICD-10-CM | POA: Diagnosis not present

## 2016-01-17 DIAGNOSIS — G4733 Obstructive sleep apnea (adult) (pediatric): Secondary | ICD-10-CM | POA: Diagnosis not present

## 2016-01-22 DIAGNOSIS — G4733 Obstructive sleep apnea (adult) (pediatric): Secondary | ICD-10-CM | POA: Diagnosis not present

## 2016-01-22 DIAGNOSIS — J301 Allergic rhinitis due to pollen: Secondary | ICD-10-CM | POA: Diagnosis not present

## 2016-01-22 DIAGNOSIS — R5383 Other fatigue: Secondary | ICD-10-CM | POA: Diagnosis not present

## 2016-01-22 DIAGNOSIS — J4520 Mild intermittent asthma, uncomplicated: Secondary | ICD-10-CM | POA: Diagnosis not present

## 2016-01-24 DIAGNOSIS — E119 Type 2 diabetes mellitus without complications: Secondary | ICD-10-CM | POA: Diagnosis not present

## 2016-01-30 DIAGNOSIS — Z961 Presence of intraocular lens: Secondary | ICD-10-CM | POA: Diagnosis not present

## 2016-01-30 DIAGNOSIS — H353131 Nonexudative age-related macular degeneration, bilateral, early dry stage: Secondary | ICD-10-CM | POA: Diagnosis not present

## 2016-01-30 DIAGNOSIS — E119 Type 2 diabetes mellitus without complications: Secondary | ICD-10-CM | POA: Diagnosis not present

## 2016-02-02 DIAGNOSIS — M19012 Primary osteoarthritis, left shoulder: Secondary | ICD-10-CM | POA: Diagnosis not present

## 2016-02-12 DIAGNOSIS — J4520 Mild intermittent asthma, uncomplicated: Secondary | ICD-10-CM | POA: Diagnosis not present

## 2016-02-12 DIAGNOSIS — R5383 Other fatigue: Secondary | ICD-10-CM | POA: Diagnosis not present

## 2016-02-12 DIAGNOSIS — J301 Allergic rhinitis due to pollen: Secondary | ICD-10-CM | POA: Diagnosis not present

## 2016-02-12 DIAGNOSIS — G4733 Obstructive sleep apnea (adult) (pediatric): Secondary | ICD-10-CM | POA: Diagnosis not present

## 2016-02-13 DIAGNOSIS — G4733 Obstructive sleep apnea (adult) (pediatric): Secondary | ICD-10-CM | POA: Diagnosis not present

## 2016-03-04 DIAGNOSIS — N390 Urinary tract infection, site not specified: Secondary | ICD-10-CM | POA: Diagnosis not present

## 2016-03-04 DIAGNOSIS — N3289 Other specified disorders of bladder: Secondary | ICD-10-CM | POA: Diagnosis not present

## 2016-03-12 DIAGNOSIS — T2690XA Corrosion of unspecified eye and adnexa, part unspecified, initial encounter: Secondary | ICD-10-CM | POA: Diagnosis not present

## 2016-03-20 DIAGNOSIS — N3281 Overactive bladder: Secondary | ICD-10-CM | POA: Diagnosis not present

## 2016-03-20 DIAGNOSIS — N3090 Cystitis, unspecified without hematuria: Secondary | ICD-10-CM | POA: Diagnosis not present

## 2016-03-21 DIAGNOSIS — H353133 Nonexudative age-related macular degeneration, bilateral, advanced atrophic without subfoveal involvement: Secondary | ICD-10-CM | POA: Diagnosis not present

## 2016-04-03 ENCOUNTER — Encounter: Payer: Self-pay | Admitting: Sports Medicine

## 2016-04-03 ENCOUNTER — Ambulatory Visit (INDEPENDENT_AMBULATORY_CARE_PROVIDER_SITE_OTHER): Payer: Medicare Other | Admitting: Sports Medicine

## 2016-04-03 DIAGNOSIS — B351 Tinea unguium: Secondary | ICD-10-CM

## 2016-04-03 DIAGNOSIS — I739 Peripheral vascular disease, unspecified: Secondary | ICD-10-CM

## 2016-04-03 DIAGNOSIS — M79675 Pain in left toe(s): Secondary | ICD-10-CM

## 2016-04-03 DIAGNOSIS — R29898 Other symptoms and signs involving the musculoskeletal system: Secondary | ICD-10-CM

## 2016-04-03 DIAGNOSIS — M79674 Pain in right toe(s): Secondary | ICD-10-CM | POA: Diagnosis not present

## 2016-04-03 DIAGNOSIS — M2040 Other hammer toe(s) (acquired), unspecified foot: Secondary | ICD-10-CM

## 2016-04-03 DIAGNOSIS — E1140 Type 2 diabetes mellitus with diabetic neuropathy, unspecified: Secondary | ICD-10-CM

## 2016-04-03 DIAGNOSIS — L909 Atrophic disorder of skin, unspecified: Secondary | ICD-10-CM

## 2016-04-03 NOTE — Progress Notes (Signed)
Subjective: Shelia Sullivan is a 83 y.o. female patient with history of diabetes who presents to office today complaining of long, painful nails  while ambulating in shoes; unable to trim. Patient states that the glucose reading this morning was "not real good but a1c good". Patient denies any new changes in medication or new problems.  There are no active problems to display for this patient.  Current Outpatient Prescriptions on File Prior to Visit  Medication Sig Dispense Refill  . ADVAIR DISKUS 250-50 MCG/DOSE AEPB     . carvedilol (COREG) 6.25 MG tablet     . cefdinir (OMNICEF) 300 MG capsule     . ciprofloxacin (CILOXAN) 0.3 % ophthalmic solution     . diclofenac sodium (VOLTAREN) 1 % GEL     . DULoxetine (CYMBALTA) 60 MG capsule     . eszopiclone (LUNESTA) 2 MG TABS tablet     . furosemide (LASIX) 20 MG tablet     . gabapentin (NEURONTIN) 800 MG tablet Take 800 mg by mouth 3 (three) times daily.    Marland Kitchen HYDROcodone-acetaminophen (NORCO/VICODIN) 5-325 MG tablet     . LEVEMIR FLEXTOUCH 100 UNIT/ML Pen     . lisinopril (PRINIVIL,ZESTRIL) 20 MG tablet     . metFORMIN (GLUCOPHAGE) 850 MG tablet     . nitrofurantoin (MACRODANTIN) 100 MG capsule     . omeprazole (PRILOSEC) 20 MG capsule     . pravastatin (PRAVACHOL) 80 MG tablet     . QUEtiapine (SEROQUEL) 200 MG tablet     . SURE COMFORT PEN NEEDLES 32G X 6 MM MISC     . traMADol (ULTRAM) 50 MG tablet     . TRULICITY [id number] SOPN      No current facility-administered medications on file prior to visit.    Allergies  Allergen Reactions  . Codeine   . Nsaids Other (See Comments)    GI Bleeding    No results found for this or any previous visit (from the past 2160 hour(s)).  Objective: General: Patient is awake, alert, and oriented x 3 and in no acute distress.  Cane assisted gait.   Integument: Skin is dry and supple bilateral. Nails are tender, long, thickened and dystrophic with subungual debris, consistent with  onychomycosis, 2-5 bilateral.  There is no hallux nails bilateral from previous removal. No signs of infection. No open lesions or preulcerative lesions present bilateral. Remaining integument unremarkable.  Vasculature:  Dorsalis Pedis pulse 1/4 bilateral. Posterior Tibial pulse  0/4 bilateral. Capillary fill time <5 sec 1-5 bilateral. Scant hair growth to the level of the digits.Temperature gradient mildly decreased bilateral. + varicosities present bilateral. Trace edema present bilateral, L>R chronic.   Neurology: The patient has absent sensation measured with a 5.07/10g Semmes Weinstein Monofilament at all pedal sites bilateral . Vibratory sensation diminished bilateral with tuning fork. No Babinski sign present bilateral.   Musculoskeletal: Asymptomatic hammertoe and fat pad atrophy pedal deformities noted bilateral. Muscular strength 5/5 in all lower extremity muscular groups bilateral without pain on range of motion however stiffness likely arthritis present. No tenderness with calf compression bilateral.  Assessment and Plan: Problem List Items Addressed This Visit    None    Visit Diagnoses    Pain due to onychomycosis of toenails of both feet    -  Primary   Type 2 diabetes, controlled, with neuropathy (HCC)       PVD (peripheral vascular disease) ([place])       Hammer toe, unspecified laterality  Fat pad atrophy of foot          -Examined patient. -Discussed and educated patient on diabetic foot care, especially with  regards to the vascular, neurological and musculoskeletal systems.  -Stressed the importance of good glycemic control and the detriment of not controlling glucose levels in relation to the foot. -Mechanically debrided all nails 2-5 bilateral using sterile nail nipper and filed with dremel without incident  -Continue with diabetic shoes  -Answered all patient questions -Patient to return  in 3 months for at risk foot care -Patient advised to call the office if  any problems or questions arise in the meantime.  Landis Martins, DPM

## 2016-04-08 DIAGNOSIS — M19012 Primary osteoarthritis, left shoulder: Secondary | ICD-10-CM | POA: Diagnosis not present

## 2016-04-17 DIAGNOSIS — K219 Gastro-esophageal reflux disease without esophagitis: Secondary | ICD-10-CM | POA: Diagnosis not present

## 2016-04-17 DIAGNOSIS — E039 Hypothyroidism, unspecified: Secondary | ICD-10-CM | POA: Diagnosis not present

## 2016-04-17 DIAGNOSIS — E7800 Pure hypercholesterolemia, unspecified: Secondary | ICD-10-CM | POA: Diagnosis not present

## 2016-04-17 DIAGNOSIS — I129 Hypertensive chronic kidney disease with stage 1 through stage 4 chronic kidney disease, or unspecified chronic kidney disease: Secondary | ICD-10-CM | POA: Diagnosis not present

## 2016-04-17 DIAGNOSIS — F331 Major depressive disorder, recurrent, moderate: Secondary | ICD-10-CM | POA: Diagnosis not present

## 2016-04-17 DIAGNOSIS — G8929 Other chronic pain: Secondary | ICD-10-CM | POA: Diagnosis not present

## 2016-04-17 DIAGNOSIS — E559 Vitamin D deficiency, unspecified: Secondary | ICD-10-CM | POA: Diagnosis not present

## 2016-04-17 DIAGNOSIS — N183 Chronic kidney disease, stage 3 (moderate): Secondary | ICD-10-CM | POA: Diagnosis not present

## 2016-04-17 DIAGNOSIS — M545 Low back pain: Secondary | ICD-10-CM | POA: Diagnosis not present

## 2016-04-17 DIAGNOSIS — Z79899 Other long term (current) drug therapy: Secondary | ICD-10-CM | POA: Diagnosis not present

## 2016-04-17 DIAGNOSIS — E1122 Type 2 diabetes mellitus with diabetic chronic kidney disease: Secondary | ICD-10-CM | POA: Diagnosis not present

## 2016-04-17 DIAGNOSIS — M542 Cervicalgia: Secondary | ICD-10-CM | POA: Diagnosis not present

## 2016-05-01 DIAGNOSIS — N3281 Overactive bladder: Secondary | ICD-10-CM | POA: Diagnosis not present

## 2016-05-01 DIAGNOSIS — N952 Postmenopausal atrophic vaginitis: Secondary | ICD-10-CM | POA: Diagnosis not present

## 2016-05-01 DIAGNOSIS — N3090 Cystitis, unspecified without hematuria: Secondary | ICD-10-CM | POA: Diagnosis not present

## 2016-05-03 DIAGNOSIS — G4733 Obstructive sleep apnea (adult) (pediatric): Secondary | ICD-10-CM | POA: Diagnosis not present

## 2016-05-07 DIAGNOSIS — J4520 Mild intermittent asthma, uncomplicated: Secondary | ICD-10-CM | POA: Diagnosis not present

## 2016-05-07 DIAGNOSIS — R5383 Other fatigue: Secondary | ICD-10-CM | POA: Diagnosis not present

## 2016-05-07 DIAGNOSIS — J301 Allergic rhinitis due to pollen: Secondary | ICD-10-CM | POA: Diagnosis not present

## 2016-05-07 DIAGNOSIS — G4733 Obstructive sleep apnea (adult) (pediatric): Secondary | ICD-10-CM | POA: Diagnosis not present

## 2016-06-18 DIAGNOSIS — Z1231 Encounter for screening mammogram for malignant neoplasm of breast: Secondary | ICD-10-CM | POA: Diagnosis not present

## 2016-06-20 DIAGNOSIS — H353133 Nonexudative age-related macular degeneration, bilateral, advanced atrophic without subfoveal involvement: Secondary | ICD-10-CM | POA: Diagnosis not present

## 2016-06-26 DIAGNOSIS — F331 Major depressive disorder, recurrent, moderate: Secondary | ICD-10-CM | POA: Diagnosis not present

## 2016-06-26 DIAGNOSIS — G259 Extrapyramidal and movement disorder, unspecified: Secondary | ICD-10-CM | POA: Diagnosis not present

## 2016-07-02 DIAGNOSIS — Z6839 Body mass index (BMI) 39.0-39.9, adult: Secondary | ICD-10-CM | POA: Diagnosis not present

## 2016-07-02 DIAGNOSIS — N6011 Diffuse cystic mastopathy of right breast: Secondary | ICD-10-CM | POA: Diagnosis not present

## 2016-07-02 DIAGNOSIS — N6012 Diffuse cystic mastopathy of left breast: Secondary | ICD-10-CM | POA: Diagnosis not present

## 2016-07-03 ENCOUNTER — Encounter: Payer: Self-pay | Admitting: Sports Medicine

## 2016-07-03 ENCOUNTER — Ambulatory Visit (INDEPENDENT_AMBULATORY_CARE_PROVIDER_SITE_OTHER): Payer: Medicare Other | Admitting: Sports Medicine

## 2016-07-03 DIAGNOSIS — E1140 Type 2 diabetes mellitus with diabetic neuropathy, unspecified: Secondary | ICD-10-CM | POA: Diagnosis not present

## 2016-07-03 DIAGNOSIS — M2040 Other hammer toe(s) (acquired), unspecified foot: Secondary | ICD-10-CM | POA: Diagnosis not present

## 2016-07-03 DIAGNOSIS — M79674 Pain in right toe(s): Secondary | ICD-10-CM

## 2016-07-03 DIAGNOSIS — L84 Corns and callosities: Secondary | ICD-10-CM | POA: Diagnosis not present

## 2016-07-03 DIAGNOSIS — M79675 Pain in left toe(s): Secondary | ICD-10-CM | POA: Diagnosis not present

## 2016-07-03 DIAGNOSIS — B351 Tinea unguium: Secondary | ICD-10-CM

## 2016-07-03 DIAGNOSIS — R52 Pain, unspecified: Secondary | ICD-10-CM

## 2016-07-03 DIAGNOSIS — I739 Peripheral vascular disease, unspecified: Secondary | ICD-10-CM

## 2016-07-03 NOTE — Progress Notes (Signed)
Subjective: Shelia Sullivan is a 83 y.o. female patient with history of diabetes who presents to office today complaining of long, painful nails and callus  while ambulating in shoes; unable to trim. Patient states that the glucose reading same. Patient denies any new changes in medication or new problems.  There are no active problems to display for this patient.  Current Outpatient Prescriptions on File Prior to Visit  Medication Sig Dispense Refill  . ADVAIR DISKUS 250-50 MCG/DOSE AEPB     . carvedilol (COREG) 6.25 MG tablet     . cefdinir (OMNICEF) 300 MG capsule     . ciprofloxacin (CILOXAN) 0.3 % ophthalmic solution     . diclofenac sodium (VOLTAREN) 1 % GEL     . DULoxetine (CYMBALTA) 60 MG capsule     . eszopiclone (LUNESTA) 2 MG TABS tablet     . furosemide (LASIX) 20 MG tablet     . gabapentin (NEURONTIN) 800 MG tablet Take 800 mg by mouth 3 (three) times daily.    Marland Kitchen HYDROcodone-acetaminophen (NORCO/VICODIN) 5-325 MG tablet     . LEVEMIR FLEXTOUCH 100 UNIT/ML Pen     . lisinopril (PRINIVIL,ZESTRIL) 20 MG tablet     . metFORMIN (GLUCOPHAGE) 850 MG tablet     . nitrofurantoin (MACRODANTIN) 100 MG capsule     . omeprazole (PRILOSEC) 20 MG capsule     . pravastatin (PRAVACHOL) 80 MG tablet     . QUEtiapine (SEROQUEL) 200 MG tablet     . SURE COMFORT PEN NEEDLES 32G X 6 MM MISC     . traMADol (ULTRAM) 50 MG tablet     . TRULICITY 1.61 WR/6.0AV SOPN      No current facility-administered medications on file prior to visit.    Allergies  Allergen Reactions  . Codeine   . Nsaids Other (See Comments)    GI Bleeding    No results found for this or any previous visit (from the past 2160 hour(s)).  Objective: General: Patient is awake, alert, and oriented x 3 and in no acute distress.  Cane assisted gait.   Integument: Skin is dry and supple bilateral. Nails are tender, long, thickened and dystrophic with subungual debris, consistent with onychomycosis, 2-5 bilateral.   There is no hallux nails bilateral from previous removal. No signs of infection. + callus at tips of 3rd toes bilateral. Remaining integument unremarkable.  Vasculature:  Dorsalis Pedis pulse 1/4 bilateral. Posterior Tibial pulse  0/4 bilateral. Capillary fill time <5 sec 1-5 bilateral. Scant hair growth to the level of the digits.Temperature gradient mildly decreased bilateral. + varicosities present bilateral. Trace edema present bilateral, L>R chronic.   Neurology: The patient has absent sensation measured with a 5.07/10g Semmes Weinstein Monofilament at all pedal sites bilateral . Vibratory sensation diminished bilateral with tuning fork. No Babinski sign present bilateral.   Musculoskeletal: Asymptomatic hammertoe and fat pad atrophy pedal deformities noted bilateral. Muscular strength 5/5 in all lower extremity muscular groups bilateral without pain on range of motion however stiffness likely arthritis present. No tenderness with calf compression bilateral.  Assessment and Plan: Problem List Items Addressed This Visit    None    Visit Diagnoses    Pain due to onychomycosis of toenails of both feet    -  Primary   Callus of foot       Hammer toe, unspecified laterality       Type 2 diabetes, controlled, with neuropathy ([place])       PVD (peripheral  vascular disease) ([place])       Pain          -Examined patient. -Discussed and educated patient on diabetic foot care, especially with  regards to the vascular, neurological and musculoskeletal systems.  -Stressed the importance of good glycemic control and the detriment of not controlling glucose levels in relation to the foot. -Mechanically debrided callus x 2 using sterile chisel blade and all nails 2-5 bilateral using sterile nail nipper and filed with dremel without incident  -Dispensed toe caps for 3rd toes and instructed on use -Continue with diabetic shoes  -Answered all patient questions -Patient to return  in 3 months for at risk  foot care -Patient advised to call the office if any problems or questions arise in the meantime.  Landis Martins, DPM

## 2016-07-08 DIAGNOSIS — M1611 Unilateral primary osteoarthritis, right hip: Secondary | ICD-10-CM | POA: Diagnosis not present

## 2016-07-10 DIAGNOSIS — M25551 Pain in right hip: Secondary | ICD-10-CM | POA: Diagnosis not present

## 2016-07-10 DIAGNOSIS — M1611 Unilateral primary osteoarthritis, right hip: Secondary | ICD-10-CM | POA: Diagnosis not present

## 2016-07-15 DIAGNOSIS — N183 Chronic kidney disease, stage 3 (moderate): Secondary | ICD-10-CM | POA: Diagnosis not present

## 2016-07-15 DIAGNOSIS — I129 Hypertensive chronic kidney disease with stage 1 through stage 4 chronic kidney disease, or unspecified chronic kidney disease: Secondary | ICD-10-CM | POA: Diagnosis not present

## 2016-07-15 DIAGNOSIS — E039 Hypothyroidism, unspecified: Secondary | ICD-10-CM | POA: Diagnosis not present

## 2016-07-15 DIAGNOSIS — F331 Major depressive disorder, recurrent, moderate: Secondary | ICD-10-CM | POA: Diagnosis not present

## 2016-07-15 DIAGNOSIS — J449 Chronic obstructive pulmonary disease, unspecified: Secondary | ICD-10-CM | POA: Diagnosis not present

## 2016-07-15 DIAGNOSIS — E559 Vitamin D deficiency, unspecified: Secondary | ICD-10-CM | POA: Diagnosis not present

## 2016-07-15 DIAGNOSIS — K219 Gastro-esophageal reflux disease without esophagitis: Secondary | ICD-10-CM | POA: Diagnosis not present

## 2016-07-15 DIAGNOSIS — E1165 Type 2 diabetes mellitus with hyperglycemia: Secondary | ICD-10-CM | POA: Diagnosis not present

## 2016-07-15 DIAGNOSIS — E1122 Type 2 diabetes mellitus with diabetic chronic kidney disease: Secondary | ICD-10-CM | POA: Diagnosis not present

## 2016-07-15 DIAGNOSIS — E7800 Pure hypercholesterolemia, unspecified: Secondary | ICD-10-CM | POA: Diagnosis not present

## 2016-09-03 DIAGNOSIS — G4733 Obstructive sleep apnea (adult) (pediatric): Secondary | ICD-10-CM | POA: Diagnosis not present

## 2016-09-04 DIAGNOSIS — N3090 Cystitis, unspecified without hematuria: Secondary | ICD-10-CM | POA: Diagnosis not present

## 2016-09-04 DIAGNOSIS — N3281 Overactive bladder: Secondary | ICD-10-CM | POA: Diagnosis not present

## 2016-09-10 DIAGNOSIS — R5383 Other fatigue: Secondary | ICD-10-CM | POA: Diagnosis not present

## 2016-09-10 DIAGNOSIS — J301 Allergic rhinitis due to pollen: Secondary | ICD-10-CM | POA: Diagnosis not present

## 2016-09-10 DIAGNOSIS — G4733 Obstructive sleep apnea (adult) (pediatric): Secondary | ICD-10-CM | POA: Diagnosis not present

## 2016-09-10 DIAGNOSIS — J4520 Mild intermittent asthma, uncomplicated: Secondary | ICD-10-CM | POA: Diagnosis not present

## 2016-10-04 ENCOUNTER — Ambulatory Visit: Payer: Medicare Other | Admitting: Sports Medicine

## 2016-10-16 DIAGNOSIS — K219 Gastro-esophageal reflux disease without esophagitis: Secondary | ICD-10-CM | POA: Diagnosis not present

## 2016-10-16 DIAGNOSIS — N183 Chronic kidney disease, stage 3 (moderate): Secondary | ICD-10-CM | POA: Diagnosis not present

## 2016-10-16 DIAGNOSIS — I129 Hypertensive chronic kidney disease with stage 1 through stage 4 chronic kidney disease, or unspecified chronic kidney disease: Secondary | ICD-10-CM | POA: Diagnosis not present

## 2016-10-16 DIAGNOSIS — E559 Vitamin D deficiency, unspecified: Secondary | ICD-10-CM | POA: Diagnosis not present

## 2016-10-16 DIAGNOSIS — F331 Major depressive disorder, recurrent, moderate: Secondary | ICD-10-CM | POA: Diagnosis not present

## 2016-10-16 DIAGNOSIS — E039 Hypothyroidism, unspecified: Secondary | ICD-10-CM | POA: Diagnosis not present

## 2016-10-16 DIAGNOSIS — E7800 Pure hypercholesterolemia, unspecified: Secondary | ICD-10-CM | POA: Diagnosis not present

## 2016-10-16 DIAGNOSIS — E1122 Type 2 diabetes mellitus with diabetic chronic kidney disease: Secondary | ICD-10-CM | POA: Diagnosis not present

## 2016-10-18 ENCOUNTER — Encounter (INDEPENDENT_AMBULATORY_CARE_PROVIDER_SITE_OTHER): Payer: Self-pay

## 2016-10-18 ENCOUNTER — Ambulatory Visit (INDEPENDENT_AMBULATORY_CARE_PROVIDER_SITE_OTHER): Payer: Medicare Other | Admitting: Sports Medicine

## 2016-10-18 DIAGNOSIS — I739 Peripheral vascular disease, unspecified: Secondary | ICD-10-CM

## 2016-10-18 DIAGNOSIS — E1140 Type 2 diabetes mellitus with diabetic neuropathy, unspecified: Secondary | ICD-10-CM | POA: Diagnosis not present

## 2016-10-18 DIAGNOSIS — M79674 Pain in right toe(s): Secondary | ICD-10-CM

## 2016-10-18 DIAGNOSIS — L84 Corns and callosities: Secondary | ICD-10-CM | POA: Diagnosis not present

## 2016-10-18 DIAGNOSIS — M2040 Other hammer toe(s) (acquired), unspecified foot: Secondary | ICD-10-CM | POA: Diagnosis not present

## 2016-10-18 DIAGNOSIS — B351 Tinea unguium: Secondary | ICD-10-CM | POA: Diagnosis not present

## 2016-10-18 DIAGNOSIS — M79675 Pain in left toe(s): Secondary | ICD-10-CM

## 2016-10-18 NOTE — Progress Notes (Addendum)
Subjective: Shelia Sullivan is a 83 y.o. female patient with history of diabetes who presents to office today complaining of long, painful nails and callus while ambulating in shoes; unable to trim. Patient states that the glucose reading same. Patient denies any new changes in medication or new problems.  There are no active problems to display for this patient.  Current Outpatient Prescriptions on File Prior to Visit  Medication Sig Dispense Refill  . ADVAIR DISKUS 250-50 MCG/DOSE AEPB     . carvedilol (COREG) 6.25 MG tablet     . cefdinir (OMNICEF) 300 MG capsule     . ciprofloxacin (CILOXAN) 0.3 % ophthalmic solution     . diclofenac sodium (VOLTAREN) 1 % GEL     . DULoxetine (CYMBALTA) 60 MG capsule     . eszopiclone (LUNESTA) 2 MG TABS tablet     . furosemide (LASIX) 20 MG tablet     . gabapentin (NEURONTIN) 800 MG tablet Take 800 mg by mouth 3 (three) times daily.    Marland Kitchen HYDROcodone-acetaminophen (NORCO/VICODIN) 5-325 MG tablet     . LEVEMIR FLEXTOUCH 100 UNIT/ML Pen     . lisinopril (PRINIVIL,ZESTRIL) 20 MG tablet     . metFORMIN (GLUCOPHAGE) 850 MG tablet     . nitrofurantoin (MACRODANTIN) 100 MG capsule     . omeprazole (PRILOSEC) 20 MG capsule     . pravastatin (PRAVACHOL) 80 MG tablet     . QUEtiapine (SEROQUEL) 200 MG tablet     . SURE COMFORT PEN NEEDLES 32G X 6 MM MISC     . traMADol (ULTRAM) 50 MG tablet     . TRULICITY 5.78 IO/9.6EX SOPN      No current facility-administered medications on file prior to visit.    Allergies  Allergen Reactions  . Codeine   . Nsaids Other (See Comments)    GI Bleeding    No results found for this or any previous visit (from the past 2160 hour(s)).  Objective: General: Patient is awake, alert, and oriented x 3 and in no acute distress.  Cane assisted gait.   Integument: Skin is dry and supple bilateral. Nails are tender, long, thickened and dystrophic with subungual debris, consistent with onychomycosis, 2-5 bilateral.  There  is no hallux nails bilateral from previous removal. No signs of infection. + callus at tips of 3rd toes bilateral. Remaining integument unremarkable.  Vasculature:  Dorsalis Pedis pulse 1/4 bilateral. Posterior Tibial pulse  0/4 bilateral. Capillary fill time <5 sec 1-5 bilateral. Scant hair growth to the level of the digits.Temperature gradient mildly decreased bilateral. + varicosities present bilateral. Trace edema present bilateral, L>R chronic.   Neurology: The patient has absent sensation measured with a 5.07/10g Semmes Weinstein Monofilament at all pedal sites bilateral . Vibratory sensation diminished bilateral with tuning fork. No Babinski sign present bilateral.   Musculoskeletal: Asymptomatic hammertoe and fat pad atrophy with planus pedal deformities noted bilateral. Muscular strength 5/5 in all lower extremity muscular groups bilateral without pain on range of motion however stiffness likely arthritis present. No tenderness with calf compression bilateral.  Assessment and Plan: Problem List Items Addressed This Visit    None    Visit Diagnoses    Pain due to onychomycosis of toenails of both feet    -  Primary   Callus of foot       Hammer toe, unspecified laterality       Type 2 diabetes, controlled, with neuropathy ([place])       PVD (  peripheral vascular disease) ([place])         -Examined patient. -Discussed and educated patient on diabetic foot care, especially with  regards to the vascular, neurological and musculoskeletal systems.  -Stressed the importance of good glycemic control and the detriment of not controlling glucose levels in relation to the foot. -Mechanically debrided callus x 2 using sterile chisel blade and all nails 2-5 bilateral using sterile nail nipper and filed with dremel without incident  -Continue with toe caps for 3rd toes -Continue with diabetic shoes. [employer] diabetic shoe order form was completed; office to contact primary care for approval /  certification;  Office to arrange shoe fitting and dispensing. -Answered all patient questions -Patient to return  in 3 months for at risk foot care -Patient advised to call the office if any problems or questions arise in the meantime.  Landis Martins, DPM

## 2016-10-29 DIAGNOSIS — N390 Urinary tract infection, site not specified: Secondary | ICD-10-CM | POA: Diagnosis not present

## 2016-11-01 DIAGNOSIS — M19012 Primary osteoarthritis, left shoulder: Secondary | ICD-10-CM | POA: Diagnosis not present

## 2016-12-09 DIAGNOSIS — R634 Abnormal weight loss: Secondary | ICD-10-CM | POA: Diagnosis not present

## 2016-12-09 DIAGNOSIS — F4321 Adjustment disorder with depressed mood: Secondary | ICD-10-CM | POA: Diagnosis not present

## 2016-12-09 DIAGNOSIS — E7041 Histidinemia: Secondary | ICD-10-CM | POA: Diagnosis not present

## 2016-12-09 DIAGNOSIS — Z634 Disappearance and death of family member: Secondary | ICD-10-CM | POA: Diagnosis not present

## 2016-12-09 DIAGNOSIS — Z23 Encounter for immunization: Secondary | ICD-10-CM | POA: Diagnosis not present

## 2016-12-19 DIAGNOSIS — N390 Urinary tract infection, site not specified: Secondary | ICD-10-CM | POA: Diagnosis not present

## 2016-12-30 DIAGNOSIS — R310 Gross hematuria: Secondary | ICD-10-CM | POA: Diagnosis not present

## 2016-12-30 DIAGNOSIS — J301 Allergic rhinitis due to pollen: Secondary | ICD-10-CM | POA: Diagnosis not present

## 2016-12-30 DIAGNOSIS — N3281 Overactive bladder: Secondary | ICD-10-CM | POA: Diagnosis not present

## 2016-12-30 DIAGNOSIS — N390 Urinary tract infection, site not specified: Secondary | ICD-10-CM | POA: Diagnosis not present

## 2016-12-30 DIAGNOSIS — G4733 Obstructive sleep apnea (adult) (pediatric): Secondary | ICD-10-CM | POA: Diagnosis not present

## 2016-12-30 DIAGNOSIS — R5383 Other fatigue: Secondary | ICD-10-CM | POA: Diagnosis not present

## 2016-12-30 DIAGNOSIS — J4530 Mild persistent asthma, uncomplicated: Secondary | ICD-10-CM | POA: Diagnosis not present

## 2017-01-07 DIAGNOSIS — N200 Calculus of kidney: Secondary | ICD-10-CM | POA: Diagnosis not present

## 2017-01-07 DIAGNOSIS — I700 Atherosclerosis of aorta: Secondary | ICD-10-CM | POA: Diagnosis not present

## 2017-01-07 DIAGNOSIS — D3501 Benign neoplasm of right adrenal gland: Secondary | ICD-10-CM | POA: Diagnosis not present

## 2017-01-07 DIAGNOSIS — R310 Gross hematuria: Secondary | ICD-10-CM | POA: Diagnosis not present

## 2017-01-13 DIAGNOSIS — N3090 Cystitis, unspecified without hematuria: Secondary | ICD-10-CM | POA: Diagnosis not present

## 2017-01-13 DIAGNOSIS — N200 Calculus of kidney: Secondary | ICD-10-CM | POA: Diagnosis not present

## 2017-01-13 DIAGNOSIS — N3281 Overactive bladder: Secondary | ICD-10-CM | POA: Diagnosis not present

## 2017-01-22 ENCOUNTER — Encounter: Payer: Self-pay | Admitting: Sports Medicine

## 2017-01-22 ENCOUNTER — Ambulatory Visit (INDEPENDENT_AMBULATORY_CARE_PROVIDER_SITE_OTHER): Payer: Medicare Other | Admitting: Sports Medicine

## 2017-01-22 DIAGNOSIS — M79675 Pain in left toe(s): Secondary | ICD-10-CM

## 2017-01-22 DIAGNOSIS — M2040 Other hammer toe(s) (acquired), unspecified foot: Secondary | ICD-10-CM

## 2017-01-22 DIAGNOSIS — E1140 Type 2 diabetes mellitus with diabetic neuropathy, unspecified: Secondary | ICD-10-CM

## 2017-01-22 DIAGNOSIS — M79674 Pain in right toe(s): Secondary | ICD-10-CM | POA: Diagnosis not present

## 2017-01-22 DIAGNOSIS — L84 Corns and callosities: Secondary | ICD-10-CM | POA: Diagnosis not present

## 2017-01-22 DIAGNOSIS — B351 Tinea unguium: Secondary | ICD-10-CM

## 2017-01-22 DIAGNOSIS — I739 Peripheral vascular disease, unspecified: Secondary | ICD-10-CM | POA: Diagnosis not present

## 2017-01-22 NOTE — Progress Notes (Signed)
Subjective: Shelia Sullivan is a 83 y.o. female patient with history of diabetes who presents to office today complaining of long, painful nails and callus while ambulating in shoes; unable to trim. Patient states that the glucose reading was elevated because of steroid shots, reports she is having a difficult time with her arthritis. Patient denies any new changes in medication or new problems.  There are no active problems to display for this patient.  Current Outpatient Medications on File Prior to Visit  Medication Sig Dispense Refill  . ADVAIR DISKUS 250-50 MCG/DOSE AEPB     . carvedilol (COREG) 6.25 MG tablet     . cefdinir (OMNICEF) 300 MG capsule     . ciprofloxacin (CILOXAN) 0.3 % ophthalmic solution     . diclofenac sodium (VOLTAREN) 1 % GEL     . DULoxetine (CYMBALTA) 60 MG capsule     . eszopiclone (LUNESTA) 2 MG TABS tablet     . furosemide (LASIX) 20 MG tablet     . gabapentin (NEURONTIN) 800 MG tablet Take 800 mg by mouth 3 (three) times daily.    Marland Kitchen HYDROcodone-acetaminophen (NORCO/VICODIN) 5-325 MG tablet     . LEVEMIR FLEXTOUCH 100 UNIT/ML Pen     . lisinopril (PRINIVIL,ZESTRIL) 20 MG tablet     . metFORMIN (GLUCOPHAGE) 850 MG tablet     . nitrofurantoin (MACRODANTIN) 100 MG capsule     . omeprazole (PRILOSEC) 20 MG capsule     . pravastatin (PRAVACHOL) 80 MG tablet     . QUEtiapine (SEROQUEL) 200 MG tablet     . SURE COMFORT PEN NEEDLES 32G X 6 MM MISC     . traMADol (ULTRAM) 50 MG tablet     . TRULICITY 5.42 HC/6.2BJ SOPN      No current facility-administered medications on file prior to visit.    Allergies  Allergen Reactions  . Codeine   . Nsaids Other (See Comments)    GI Bleeding    No results found for this or any previous visit (from the past 2160 hour(s)).  Objective: General: Patient is awake, alert, and oriented x 3 and in no acute distress.  Cane assisted gait.   Integument: Skin is dry and supple bilateral. Nails are tender, long, thickened  and dystrophic with subungual debris, consistent with onychomycosis, 2-5 bilateral.  There is no hallux nails bilateral from previous removal. No signs of infection. + callus at tips of 3rd toes bilateral. Remaining integument unremarkable.  Vasculature:  Dorsalis Pedis pulse 1/4 bilateral. Posterior Tibial pulse  0/4 bilateral. Capillary fill time <5 sec 1-5 bilateral. Scant hair growth to the level of the digits.Temperature gradient mildly decreased bilateral. + varicosities present bilateral. Trace edema present bilateral, L>R chronic.   Neurology: The patient has absent sensation measured with a 5.07/10g Semmes Weinstein Monofilament at all pedal sites bilateral . Vibratory sensation diminished bilateral with tuning fork. No Babinski sign present bilateral.   Musculoskeletal: Asymptomatic hammertoe and fat pad atrophy with planus pedal deformities noted bilateral. Muscular strength 5/5 in all lower extremity muscular groups bilateral without pain on range of motion however stiffness likely arthritis present. No tenderness with calf compression bilateral.  Assessment and Plan: Problem List Items Addressed This Visit    None    Visit Diagnoses    Pain due to onychomycosis of toenails of both feet    -  Primary   Callus of foot       Hammer toe, unspecified laterality  Type 2 diabetes, controlled, with neuropathy ([place])       PVD (peripheral vascular disease) ([place])         -Examined patient. -Discussed and educated patient on diabetic foot care, especially with  regards to the vascular, neurological and musculoskeletal systems.  -Stressed the importance of good glycemic control and the detriment of not controlling glucose levels in relation to the foot. -Mechanically debrided callus x 2 using sterile chisel blade and all nails 2-5 bilateral using sterile nail nipper and filed with dremel without incident  -Continue with toe caps for 3rd toes -Awaiting diabetic shoes; patient to return  for shoe measurements with Betha. We also gave patient diabetic shoe paperwork to take to her Doctor since previous fax attempts were not signed  -Answered all patient questions -Patient to return  in 3 months for at risk foot care -Patient advised to call the office if any problems or questions arise in the meantime.  Landis Martins, DPM

## 2017-01-30 ENCOUNTER — Other Ambulatory Visit: Payer: Medicare Other | Admitting: *Deleted

## 2017-02-06 DIAGNOSIS — E1122 Type 2 diabetes mellitus with diabetic chronic kidney disease: Secondary | ICD-10-CM | POA: Diagnosis not present

## 2017-02-06 DIAGNOSIS — E1140 Type 2 diabetes mellitus with diabetic neuropathy, unspecified: Secondary | ICD-10-CM | POA: Diagnosis not present

## 2017-02-06 DIAGNOSIS — N183 Chronic kidney disease, stage 3 (moderate): Secondary | ICD-10-CM | POA: Diagnosis not present

## 2017-02-10 DIAGNOSIS — N3090 Cystitis, unspecified without hematuria: Secondary | ICD-10-CM | POA: Diagnosis not present

## 2017-02-10 DIAGNOSIS — N200 Calculus of kidney: Secondary | ICD-10-CM | POA: Diagnosis not present

## 2017-02-20 DIAGNOSIS — N183 Chronic kidney disease, stage 3 (moderate): Secondary | ICD-10-CM | POA: Diagnosis not present

## 2017-02-20 DIAGNOSIS — E559 Vitamin D deficiency, unspecified: Secondary | ICD-10-CM | POA: Diagnosis not present

## 2017-02-20 DIAGNOSIS — E039 Hypothyroidism, unspecified: Secondary | ICD-10-CM | POA: Diagnosis not present

## 2017-02-20 DIAGNOSIS — K219 Gastro-esophageal reflux disease without esophagitis: Secondary | ICD-10-CM | POA: Diagnosis not present

## 2017-02-20 DIAGNOSIS — E7800 Pure hypercholesterolemia, unspecified: Secondary | ICD-10-CM | POA: Diagnosis not present

## 2017-02-20 DIAGNOSIS — Z01818 Encounter for other preprocedural examination: Secondary | ICD-10-CM | POA: Diagnosis not present

## 2017-02-20 DIAGNOSIS — N200 Calculus of kidney: Secondary | ICD-10-CM | POA: Diagnosis not present

## 2017-02-20 DIAGNOSIS — F331 Major depressive disorder, recurrent, moderate: Secondary | ICD-10-CM | POA: Diagnosis not present

## 2017-02-20 DIAGNOSIS — E1122 Type 2 diabetes mellitus with diabetic chronic kidney disease: Secondary | ICD-10-CM | POA: Diagnosis not present

## 2017-02-20 DIAGNOSIS — I129 Hypertensive chronic kidney disease with stage 1 through stage 4 chronic kidney disease, or unspecified chronic kidney disease: Secondary | ICD-10-CM | POA: Diagnosis not present

## 2017-02-21 DIAGNOSIS — N200 Calculus of kidney: Secondary | ICD-10-CM | POA: Diagnosis not present

## 2017-02-21 HISTORY — PX: LITHOTRIPSY: SUR834

## 2017-02-28 DIAGNOSIS — N200 Calculus of kidney: Secondary | ICD-10-CM | POA: Diagnosis not present

## 2017-02-28 DIAGNOSIS — N3090 Cystitis, unspecified without hematuria: Secondary | ICD-10-CM | POA: Diagnosis not present

## 2017-03-14 DIAGNOSIS — G4733 Obstructive sleep apnea (adult) (pediatric): Secondary | ICD-10-CM | POA: Diagnosis not present

## 2017-03-17 DIAGNOSIS — J4520 Mild intermittent asthma, uncomplicated: Secondary | ICD-10-CM | POA: Diagnosis not present

## 2017-03-17 DIAGNOSIS — J301 Allergic rhinitis due to pollen: Secondary | ICD-10-CM | POA: Diagnosis not present

## 2017-03-17 DIAGNOSIS — R5383 Other fatigue: Secondary | ICD-10-CM | POA: Diagnosis not present

## 2017-03-17 DIAGNOSIS — G4733 Obstructive sleep apnea (adult) (pediatric): Secondary | ICD-10-CM | POA: Diagnosis not present

## 2017-03-24 DIAGNOSIS — M1611 Unilateral primary osteoarthritis, right hip: Secondary | ICD-10-CM | POA: Diagnosis not present

## 2017-03-25 DIAGNOSIS — M1611 Unilateral primary osteoarthritis, right hip: Secondary | ICD-10-CM | POA: Diagnosis not present

## 2017-03-25 DIAGNOSIS — R0600 Dyspnea, unspecified: Secondary | ICD-10-CM | POA: Diagnosis not present

## 2017-03-25 DIAGNOSIS — R0602 Shortness of breath: Secondary | ICD-10-CM | POA: Diagnosis not present

## 2017-03-27 DIAGNOSIS — Z961 Presence of intraocular lens: Secondary | ICD-10-CM | POA: Diagnosis not present

## 2017-03-27 DIAGNOSIS — H353131 Nonexudative age-related macular degeneration, bilateral, early dry stage: Secondary | ICD-10-CM | POA: Diagnosis not present

## 2017-03-27 DIAGNOSIS — E109 Type 1 diabetes mellitus without complications: Secondary | ICD-10-CM | POA: Diagnosis not present

## 2017-04-10 DIAGNOSIS — H353133 Nonexudative age-related macular degeneration, bilateral, advanced atrophic without subfoveal involvement: Secondary | ICD-10-CM | POA: Diagnosis not present

## 2017-04-15 DIAGNOSIS — N200 Calculus of kidney: Secondary | ICD-10-CM | POA: Diagnosis not present

## 2017-04-15 DIAGNOSIS — N3090 Cystitis, unspecified without hematuria: Secondary | ICD-10-CM | POA: Diagnosis not present

## 2017-04-17 DIAGNOSIS — J45909 Unspecified asthma, uncomplicated: Secondary | ICD-10-CM | POA: Diagnosis not present

## 2017-04-17 DIAGNOSIS — Z79891 Long term (current) use of opiate analgesic: Secondary | ICD-10-CM | POA: Diagnosis not present

## 2017-04-17 DIAGNOSIS — M47816 Spondylosis without myelopathy or radiculopathy, lumbar region: Secondary | ICD-10-CM | POA: Diagnosis not present

## 2017-04-17 DIAGNOSIS — G894 Chronic pain syndrome: Secondary | ICD-10-CM | POA: Diagnosis not present

## 2017-04-17 DIAGNOSIS — E119 Type 2 diabetes mellitus without complications: Secondary | ICD-10-CM | POA: Diagnosis not present

## 2017-04-24 ENCOUNTER — Ambulatory Visit (INDEPENDENT_AMBULATORY_CARE_PROVIDER_SITE_OTHER): Payer: Medicare Other | Admitting: Sports Medicine

## 2017-04-24 ENCOUNTER — Encounter: Payer: Self-pay | Admitting: Sports Medicine

## 2017-04-24 DIAGNOSIS — B351 Tinea unguium: Secondary | ICD-10-CM | POA: Diagnosis not present

## 2017-04-24 DIAGNOSIS — I739 Peripheral vascular disease, unspecified: Secondary | ICD-10-CM

## 2017-04-24 DIAGNOSIS — L84 Corns and callosities: Secondary | ICD-10-CM

## 2017-04-24 DIAGNOSIS — E1140 Type 2 diabetes mellitus with diabetic neuropathy, unspecified: Secondary | ICD-10-CM

## 2017-04-24 DIAGNOSIS — M2040 Other hammer toe(s) (acquired), unspecified foot: Secondary | ICD-10-CM

## 2017-04-24 DIAGNOSIS — M79674 Pain in right toe(s): Secondary | ICD-10-CM | POA: Diagnosis not present

## 2017-04-24 DIAGNOSIS — M79675 Pain in left toe(s): Secondary | ICD-10-CM | POA: Diagnosis not present

## 2017-04-24 NOTE — Progress Notes (Signed)
Subjective: Shelia Sullivan is a 84 y.o. female patient with history of diabetes who presents to office today complaining of long, painful nails and callus while ambulating in shoes; unable to trim. Patient states that the glucose reading was not recorded. Patient denies any new changes in medication or new problems. Reports that she is still on 2L oxygen.   There are no active problems to display for this patient.  Current Outpatient Medications on File Prior to Visit  Medication Sig Dispense Refill  . ADVAIR DISKUS 250-50 MCG/DOSE AEPB     . carvedilol (COREG) 6.25 MG tablet     . cefdinir (OMNICEF) 300 MG capsule     . ciprofloxacin (CILOXAN) 0.3 % ophthalmic solution     . diclofenac sodium (VOLTAREN) 1 % GEL     . DULoxetine (CYMBALTA) 60 MG capsule     . eszopiclone (LUNESTA) 2 MG TABS tablet     . furosemide (LASIX) 20 MG tablet     . gabapentin (NEURONTIN) 800 MG tablet Take 800 mg by mouth 3 (three) times daily.    Marland Kitchen HYDROcodone-acetaminophen (NORCO/VICODIN) 5-325 MG tablet     . LEVEMIR FLEXTOUCH 100 UNIT/ML Pen     . lisinopril (PRINIVIL,ZESTRIL) 20 MG tablet     . metFORMIN (GLUCOPHAGE) 850 MG tablet     . nitrofurantoin (MACRODANTIN) 100 MG capsule     . omeprazole (PRILOSEC) 20 MG capsule     . pravastatin (PRAVACHOL) 80 MG tablet     . QUEtiapine (SEROQUEL) 200 MG tablet     . SURE COMFORT PEN NEEDLES 32G X 6 MM MISC     . traMADol (ULTRAM) 50 MG tablet     . TRULICITY 6.72 CN/4.7SJ SOPN      No current facility-administered medications on file prior to visit.    Allergies  Allergen Reactions  . Codeine   . Nsaids Other (See Comments)    GI Bleeding    No results found for this or any previous visit (from the past 2160 hour(s)).  Objective: General: Patient is awake, alert, and oriented x 3 and in no acute distress.  Cane assisted gait.   Integument: Skin is dry and supple bilateral. Nails are tender, long, thickened and dystrophic with subungual debris,  consistent with onychomycosis, 2-5 bilateral.  There is no hallux nails bilateral from previous removal. No signs of infection. + callus at tips of 3rd toes bilateral. Remaining integument unremarkable.  Vasculature:  Dorsalis Pedis pulse 1/4 bilateral. Posterior Tibial pulse  0/4 bilateral. Capillary fill time <5 sec 1-5 bilateral. Scant hair growth to the level of the digits.Temperature gradient mildly decreased bilateral. + varicosities present bilateral. Trace edema present bilateral, L>R chronic.   Neurology: The patient has absent sensation measured with a 5.07/10g Semmes Weinstein Monofilament at all pedal sites bilateral . Vibratory sensation diminished bilateral with tuning fork. No Babinski sign present bilateral.   Musculoskeletal: Asymptomatic hammertoe and fat pad atrophy with planus pedal deformities noted bilateral. Muscular strength 5/5 in all lower extremity muscular groups bilateral without pain on range of motion however stiffness likely arthritis present. No tenderness with calf compression bilateral.  Assessment and Plan: Problem List Items Addressed This Visit    None    Visit Diagnoses    Pain due to onychomycosis of toenails of both feet    -  Primary   Callus of foot       Hammer toe, unspecified laterality       Type 2 diabetes,  controlled, with neuropathy ([place])       PVD (peripheral vascular disease) ([place])         -Examined patient. -Discussed and educated patient on diabetic foot care, especially with  regards to the vascular, neurological and musculoskeletal systems.  -Stressed the importance of good glycemic control and the detriment of not controlling glucose levels in relation to the foot. -Mechanically debrided callus x 2 using rotary bur and all nails 2-5 bilateral using sterile nail nipper and filed with dremel without incident  -Continue with toe caps for 3rd toes as tolerated  -Continue with diabetic shoes -Answered all patient questions -Patient to  return  in 3 months for at risk foot care -Patient advised to call the office if any problems or questions arise in the meantime.  Landis Martins, DPM

## 2017-05-07 DIAGNOSIS — M4155 Other secondary scoliosis, thoracolumbar region: Secondary | ICD-10-CM | POA: Diagnosis not present

## 2017-06-03 DIAGNOSIS — J301 Allergic rhinitis due to pollen: Secondary | ICD-10-CM | POA: Diagnosis not present

## 2017-06-03 DIAGNOSIS — R8290 Unspecified abnormal findings in urine: Secondary | ICD-10-CM | POA: Diagnosis not present

## 2017-06-03 DIAGNOSIS — G4733 Obstructive sleep apnea (adult) (pediatric): Secondary | ICD-10-CM | POA: Diagnosis not present

## 2017-06-03 DIAGNOSIS — R5383 Other fatigue: Secondary | ICD-10-CM | POA: Diagnosis not present

## 2017-06-03 DIAGNOSIS — R319 Hematuria, unspecified: Secondary | ICD-10-CM | POA: Diagnosis not present

## 2017-06-03 DIAGNOSIS — N200 Calculus of kidney: Secondary | ICD-10-CM | POA: Diagnosis not present

## 2017-06-03 DIAGNOSIS — J4520 Mild intermittent asthma, uncomplicated: Secondary | ICD-10-CM | POA: Diagnosis not present

## 2017-06-03 DIAGNOSIS — R310 Gross hematuria: Secondary | ICD-10-CM | POA: Diagnosis not present

## 2017-06-08 DIAGNOSIS — R10819 Abdominal tenderness, unspecified site: Secondary | ICD-10-CM | POA: Diagnosis not present

## 2017-06-08 DIAGNOSIS — I739 Peripheral vascular disease, unspecified: Secondary | ICD-10-CM

## 2017-06-08 DIAGNOSIS — R079 Chest pain, unspecified: Secondary | ICD-10-CM | POA: Diagnosis not present

## 2017-06-08 DIAGNOSIS — I208 Other forms of angina pectoris: Secondary | ICD-10-CM

## 2017-06-08 DIAGNOSIS — Z8719 Personal history of other diseases of the digestive system: Secondary | ICD-10-CM

## 2017-06-08 DIAGNOSIS — I517 Cardiomegaly: Secondary | ICD-10-CM | POA: Diagnosis not present

## 2017-06-08 DIAGNOSIS — G8929 Other chronic pain: Secondary | ICD-10-CM

## 2017-06-08 DIAGNOSIS — J45909 Unspecified asthma, uncomplicated: Secondary | ICD-10-CM | POA: Diagnosis not present

## 2017-06-08 DIAGNOSIS — I2089 Other forms of angina pectoris: Secondary | ICD-10-CM

## 2017-06-08 DIAGNOSIS — F419 Anxiety disorder, unspecified: Secondary | ICD-10-CM

## 2017-06-08 DIAGNOSIS — I480 Paroxysmal atrial fibrillation: Secondary | ICD-10-CM | POA: Diagnosis not present

## 2017-06-08 DIAGNOSIS — I4891 Unspecified atrial fibrillation: Secondary | ICD-10-CM | POA: Diagnosis not present

## 2017-06-08 DIAGNOSIS — E119 Type 2 diabetes mellitus without complications: Secondary | ICD-10-CM

## 2017-06-08 DIAGNOSIS — I700 Atherosclerosis of aorta: Secondary | ICD-10-CM | POA: Diagnosis not present

## 2017-06-08 DIAGNOSIS — J9610 Chronic respiratory failure, unspecified whether with hypoxia or hypercapnia: Secondary | ICD-10-CM

## 2017-06-08 DIAGNOSIS — I252 Old myocardial infarction: Secondary | ICD-10-CM

## 2017-06-08 DIAGNOSIS — R0789 Other chest pain: Secondary | ICD-10-CM | POA: Diagnosis not present

## 2017-06-08 DIAGNOSIS — N200 Calculus of kidney: Secondary | ICD-10-CM | POA: Insufficient documentation

## 2017-06-08 HISTORY — DX: Other forms of angina pectoris: I20.89

## 2017-06-08 HISTORY — DX: Type 2 diabetes mellitus without complications: E11.9

## 2017-06-08 HISTORY — DX: Old myocardial infarction: I25.2

## 2017-06-08 HISTORY — DX: Anxiety disorder, unspecified: F41.9

## 2017-06-08 HISTORY — DX: Other forms of angina pectoris: I20.8

## 2017-06-08 HISTORY — DX: Chest pain, unspecified: R07.9

## 2017-06-08 HISTORY — DX: Other chronic pain: G89.29

## 2017-06-08 HISTORY — DX: Chronic respiratory failure, unspecified whether with hypoxia or hypercapnia: J96.10

## 2017-06-08 HISTORY — DX: Personal history of other diseases of the digestive system: Z87.19

## 2017-06-08 HISTORY — DX: Unspecified atrial fibrillation: I48.91

## 2017-06-08 HISTORY — DX: Peripheral vascular disease, unspecified: I73.9

## 2017-06-09 DIAGNOSIS — F419 Anxiety disorder, unspecified: Secondary | ICD-10-CM | POA: Diagnosis present

## 2017-06-09 DIAGNOSIS — E119 Type 2 diabetes mellitus without complications: Secondary | ICD-10-CM | POA: Diagnosis present

## 2017-06-09 DIAGNOSIS — I480 Paroxysmal atrial fibrillation: Secondary | ICD-10-CM | POA: Diagnosis present

## 2017-06-09 DIAGNOSIS — R079 Chest pain, unspecified: Secondary | ICD-10-CM | POA: Diagnosis present

## 2017-06-09 DIAGNOSIS — I252 Old myocardial infarction: Secondary | ICD-10-CM | POA: Diagnosis not present

## 2017-06-09 DIAGNOSIS — I4891 Unspecified atrial fibrillation: Secondary | ICD-10-CM | POA: Diagnosis not present

## 2017-06-09 DIAGNOSIS — E785 Hyperlipidemia, unspecified: Secondary | ICD-10-CM | POA: Insufficient documentation

## 2017-06-09 DIAGNOSIS — Z794 Long term (current) use of insulin: Secondary | ICD-10-CM | POA: Diagnosis not present

## 2017-06-09 DIAGNOSIS — Z87891 Personal history of nicotine dependence: Secondary | ICD-10-CM | POA: Diagnosis not present

## 2017-06-09 DIAGNOSIS — I10 Essential (primary) hypertension: Secondary | ICD-10-CM | POA: Diagnosis present

## 2017-06-09 DIAGNOSIS — J45909 Unspecified asthma, uncomplicated: Secondary | ICD-10-CM | POA: Diagnosis present

## 2017-06-09 DIAGNOSIS — Z7951 Long term (current) use of inhaled steroids: Secondary | ICD-10-CM | POA: Diagnosis not present

## 2017-06-09 DIAGNOSIS — Z9981 Dependence on supplemental oxygen: Secondary | ICD-10-CM | POA: Diagnosis not present

## 2017-06-09 DIAGNOSIS — I208 Other forms of angina pectoris: Secondary | ICD-10-CM | POA: Diagnosis not present

## 2017-06-09 DIAGNOSIS — K219 Gastro-esophageal reflux disease without esophagitis: Secondary | ICD-10-CM | POA: Diagnosis present

## 2017-06-09 DIAGNOSIS — J9610 Chronic respiratory failure, unspecified whether with hypoxia or hypercapnia: Secondary | ICD-10-CM | POA: Diagnosis present

## 2017-06-09 HISTORY — DX: Hyperlipidemia, unspecified: E78.5

## 2017-06-12 DIAGNOSIS — R319 Hematuria, unspecified: Secondary | ICD-10-CM | POA: Diagnosis not present

## 2017-06-12 DIAGNOSIS — R310 Gross hematuria: Secondary | ICD-10-CM | POA: Diagnosis not present

## 2017-06-12 DIAGNOSIS — K5909 Other constipation: Secondary | ICD-10-CM | POA: Diagnosis not present

## 2017-06-17 DIAGNOSIS — G629 Polyneuropathy, unspecified: Secondary | ICD-10-CM | POA: Diagnosis not present

## 2017-06-17 DIAGNOSIS — Z79899 Other long term (current) drug therapy: Secondary | ICD-10-CM | POA: Diagnosis not present

## 2017-06-17 DIAGNOSIS — E1122 Type 2 diabetes mellitus with diabetic chronic kidney disease: Secondary | ICD-10-CM | POA: Diagnosis not present

## 2017-06-17 DIAGNOSIS — E559 Vitamin D deficiency, unspecified: Secondary | ICD-10-CM | POA: Diagnosis not present

## 2017-06-17 DIAGNOSIS — Z9181 History of falling: Secondary | ICD-10-CM | POA: Diagnosis not present

## 2017-06-17 DIAGNOSIS — Z09 Encounter for follow-up examination after completed treatment for conditions other than malignant neoplasm: Secondary | ICD-10-CM | POA: Diagnosis not present

## 2017-06-17 DIAGNOSIS — I4891 Unspecified atrial fibrillation: Secondary | ICD-10-CM | POA: Diagnosis not present

## 2017-06-17 DIAGNOSIS — R5383 Other fatigue: Secondary | ICD-10-CM | POA: Diagnosis not present

## 2017-06-19 DIAGNOSIS — N200 Calculus of kidney: Secondary | ICD-10-CM | POA: Diagnosis not present

## 2017-06-19 DIAGNOSIS — E538 Deficiency of other specified B group vitamins: Secondary | ICD-10-CM | POA: Diagnosis not present

## 2017-06-19 DIAGNOSIS — R1032 Left lower quadrant pain: Secondary | ICD-10-CM | POA: Diagnosis not present

## 2017-06-25 DIAGNOSIS — N200 Calculus of kidney: Secondary | ICD-10-CM | POA: Diagnosis not present

## 2017-06-25 DIAGNOSIS — K5900 Constipation, unspecified: Secondary | ICD-10-CM | POA: Diagnosis not present

## 2017-06-25 DIAGNOSIS — E119 Type 2 diabetes mellitus without complications: Secondary | ICD-10-CM | POA: Diagnosis not present

## 2017-06-25 DIAGNOSIS — I2510 Atherosclerotic heart disease of native coronary artery without angina pectoris: Secondary | ICD-10-CM | POA: Diagnosis not present

## 2017-06-25 DIAGNOSIS — K219 Gastro-esophageal reflux disease without esophagitis: Secondary | ICD-10-CM | POA: Diagnosis not present

## 2017-06-25 DIAGNOSIS — N201 Calculus of ureter: Secondary | ICD-10-CM | POA: Diagnosis not present

## 2017-06-25 DIAGNOSIS — J45909 Unspecified asthma, uncomplicated: Secondary | ICD-10-CM | POA: Diagnosis not present

## 2017-06-25 DIAGNOSIS — I10 Essential (primary) hypertension: Secondary | ICD-10-CM | POA: Diagnosis not present

## 2017-06-25 DIAGNOSIS — Z79899 Other long term (current) drug therapy: Secondary | ICD-10-CM | POA: Diagnosis not present

## 2017-06-25 DIAGNOSIS — N3090 Cystitis, unspecified without hematuria: Secondary | ICD-10-CM | POA: Diagnosis not present

## 2017-06-26 DIAGNOSIS — Z01818 Encounter for other preprocedural examination: Secondary | ICD-10-CM | POA: Diagnosis not present

## 2017-06-27 DIAGNOSIS — K219 Gastro-esophageal reflux disease without esophagitis: Secondary | ICD-10-CM | POA: Diagnosis not present

## 2017-06-27 DIAGNOSIS — N200 Calculus of kidney: Secondary | ICD-10-CM | POA: Diagnosis not present

## 2017-06-27 DIAGNOSIS — I2510 Atherosclerotic heart disease of native coronary artery without angina pectoris: Secondary | ICD-10-CM | POA: Diagnosis not present

## 2017-06-27 DIAGNOSIS — K5900 Constipation, unspecified: Secondary | ICD-10-CM | POA: Diagnosis not present

## 2017-06-27 DIAGNOSIS — N3090 Cystitis, unspecified without hematuria: Secondary | ICD-10-CM | POA: Diagnosis not present

## 2017-06-27 DIAGNOSIS — N201 Calculus of ureter: Secondary | ICD-10-CM | POA: Diagnosis not present

## 2017-07-02 DIAGNOSIS — Z1231 Encounter for screening mammogram for malignant neoplasm of breast: Secondary | ICD-10-CM | POA: Diagnosis not present

## 2017-07-02 DIAGNOSIS — Z1331 Encounter for screening for depression: Secondary | ICD-10-CM | POA: Diagnosis not present

## 2017-07-02 DIAGNOSIS — Z0000 Encounter for general adult medical examination without abnormal findings: Secondary | ICD-10-CM | POA: Diagnosis not present

## 2017-07-02 DIAGNOSIS — N959 Unspecified menopausal and perimenopausal disorder: Secondary | ICD-10-CM | POA: Diagnosis not present

## 2017-07-02 DIAGNOSIS — Z6838 Body mass index (BMI) 38.0-38.9, adult: Secondary | ICD-10-CM | POA: Diagnosis not present

## 2017-07-02 DIAGNOSIS — Z9181 History of falling: Secondary | ICD-10-CM | POA: Diagnosis not present

## 2017-07-02 DIAGNOSIS — Z136 Encounter for screening for cardiovascular disorders: Secondary | ICD-10-CM | POA: Diagnosis not present

## 2017-07-02 DIAGNOSIS — E785 Hyperlipidemia, unspecified: Secondary | ICD-10-CM | POA: Diagnosis not present

## 2017-07-07 DIAGNOSIS — R1032 Left lower quadrant pain: Secondary | ICD-10-CM | POA: Diagnosis not present

## 2017-07-07 DIAGNOSIS — N200 Calculus of kidney: Secondary | ICD-10-CM | POA: Diagnosis not present

## 2017-07-10 DIAGNOSIS — H353133 Nonexudative age-related macular degeneration, bilateral, advanced atrophic without subfoveal involvement: Secondary | ICD-10-CM | POA: Diagnosis not present

## 2017-07-14 DIAGNOSIS — E538 Deficiency of other specified B group vitamins: Secondary | ICD-10-CM | POA: Diagnosis not present

## 2017-07-14 DIAGNOSIS — N200 Calculus of kidney: Secondary | ICD-10-CM | POA: Diagnosis not present

## 2017-07-16 DIAGNOSIS — E7800 Pure hypercholesterolemia, unspecified: Secondary | ICD-10-CM | POA: Diagnosis not present

## 2017-07-16 DIAGNOSIS — M549 Dorsalgia, unspecified: Secondary | ICD-10-CM | POA: Diagnosis not present

## 2017-07-23 ENCOUNTER — Ambulatory Visit (INDEPENDENT_AMBULATORY_CARE_PROVIDER_SITE_OTHER): Payer: Medicare Other | Admitting: Sports Medicine

## 2017-07-23 ENCOUNTER — Encounter: Payer: Self-pay | Admitting: Sports Medicine

## 2017-07-23 DIAGNOSIS — B351 Tinea unguium: Secondary | ICD-10-CM

## 2017-07-23 DIAGNOSIS — E1140 Type 2 diabetes mellitus with diabetic neuropathy, unspecified: Secondary | ICD-10-CM

## 2017-07-23 DIAGNOSIS — I739 Peripheral vascular disease, unspecified: Secondary | ICD-10-CM

## 2017-07-23 DIAGNOSIS — M79675 Pain in left toe(s): Secondary | ICD-10-CM

## 2017-07-23 DIAGNOSIS — M79674 Pain in right toe(s): Secondary | ICD-10-CM

## 2017-07-23 DIAGNOSIS — L84 Corns and callosities: Secondary | ICD-10-CM

## 2017-07-23 DIAGNOSIS — M2040 Other hammer toe(s) (acquired), unspecified foot: Secondary | ICD-10-CM

## 2017-07-23 NOTE — Progress Notes (Signed)
Subjective: Shelia Sullivan is a 84 y.o. female patient with history of diabetes who presents to office today complaining of long, painful nails and callus while ambulating in shoes; unable to trim. Patient states that the glucose reading was 125. Last saw Dr Leroy Sea her new PCP 6.7 last A1c. Last PCP 1 week ago. Patient denies any new changes in medication or new problems besides ongoing arthritis.  There are no active problems to display for this patient.  Current Outpatient Medications on File Prior to Visit  Medication Sig Dispense Refill  . ADVAIR DISKUS 250-50 MCG/DOSE AEPB     . carvedilol (COREG) 6.25 MG tablet     . cefdinir (OMNICEF) 300 MG capsule     . ciprofloxacin (CILOXAN) 0.3 % ophthalmic solution     . diclofenac sodium (VOLTAREN) 1 % GEL     . DULoxetine (CYMBALTA) 60 MG capsule     . eszopiclone (LUNESTA) 2 MG TABS tablet     . furosemide (LASIX) 20 MG tablet     . gabapentin (NEURONTIN) 800 MG tablet Take 800 mg by mouth 3 (three) times daily.    Marland Kitchen HYDROcodone-acetaminophen (NORCO/VICODIN) 5-325 MG tablet     . LEVEMIR FLEXTOUCH 100 UNIT/ML Pen     . lisinopril (PRINIVIL,ZESTRIL) 20 MG tablet     . metFORMIN (GLUCOPHAGE) 850 MG tablet     . nitrofurantoin (MACRODANTIN) 100 MG capsule     . omeprazole (PRILOSEC) 20 MG capsule     . pravastatin (PRAVACHOL) 80 MG tablet     . QUEtiapine (SEROQUEL) 200 MG tablet     . SURE COMFORT PEN NEEDLES 32G X 6 MM MISC     . traMADol (ULTRAM) 50 MG tablet     . TRULICITY 3.78 HY/8.5OY SOPN      No current facility-administered medications on file prior to visit.    Allergies  Allergen Reactions  . Codeine   . Nsaids Other (See Comments)    GI Bleeding    No results found for this or any previous visit (from the past 2160 hour(s)).  Objective: General: Patient is awake, alert, and oriented x 3 and in no acute distress.  Cane assisted gait.   Integument: Skin is dry and supple bilateral. Nails are tender, long,  thickened and dystrophic with subungual debris, consistent with onychomycosis, 2-5 bilateral.  There is no hallux nails bilateral from previous removal. No signs of infection. + callus at tips of 3rd toes bilateral. Remaining integument unremarkable.  Vasculature:  Dorsalis Pedis pulse 1/4 bilateral. Posterior Tibial pulse  0/4 bilateral. Capillary fill time <5 sec 1-5 bilateral. Scant hair growth to the level of the digits.Temperature gradient mildly decreased bilateral. + varicosities present bilateral. Trace edema present bilateral, L>R chronic.   Neurology: The patient has absent sensation measured with a 5.07/10g Semmes Weinstein Monofilament at all pedal sites bilateral . Vibratory sensation diminished bilateral with tuning fork. No Babinski sign present bilateral.   Musculoskeletal: Asymptomatic hammertoe and fat pad atrophy with planus pedal deformities noted bilateral. Muscular strength 5/5 in all lower extremity muscular groups bilateral without pain on range of motion however stiffness likely arthritis present. No tenderness with calf compression bilateral.  Assessment and Plan: Problem List Items Addressed This Visit    None    Visit Diagnoses    Pain due to onychomycosis of toenails of both feet    -  Primary   Callus of foot       Hammer toe, unspecified laterality  Type 2 diabetes, controlled, with neuropathy ([place])       PVD (peripheral vascular disease) ([place])         -Examined patient. -Discussed and educated patient on diabetic foot care, especially with  regards to the vascular, neurological and musculoskeletal systems.  -Stressed the importance of good glycemic control and the detriment of not controlling glucose levels in relation to the foot. -Mechanically debrided callus x 2 using rotary bur and all nails 2-5 bilateral using sterile nail nipper and filed with dremel without incident  -Continue with toe caps for 3rd toes as tolerated; dispense new toe caps at this  visit -Continue with diabetic shoes -Answered all patient questions -Patient to return  in 3 months for at risk foot care -Patient advised to call the office if any problems or questions arise in the meantime.  Landis Martins, DPM

## 2017-07-24 DIAGNOSIS — N200 Calculus of kidney: Secondary | ICD-10-CM | POA: Diagnosis not present

## 2017-07-24 DIAGNOSIS — R1032 Left lower quadrant pain: Secondary | ICD-10-CM | POA: Diagnosis not present

## 2017-07-29 DIAGNOSIS — E538 Deficiency of other specified B group vitamins: Secondary | ICD-10-CM | POA: Diagnosis not present

## 2017-08-11 DIAGNOSIS — G4733 Obstructive sleep apnea (adult) (pediatric): Secondary | ICD-10-CM | POA: Diagnosis not present

## 2017-08-13 DIAGNOSIS — N200 Calculus of kidney: Secondary | ICD-10-CM | POA: Diagnosis not present

## 2017-08-14 DIAGNOSIS — E1159 Type 2 diabetes mellitus with other circulatory complications: Secondary | ICD-10-CM | POA: Diagnosis not present

## 2017-08-14 DIAGNOSIS — E785 Hyperlipidemia, unspecified: Secondary | ICD-10-CM | POA: Diagnosis not present

## 2017-08-14 DIAGNOSIS — I4891 Unspecified atrial fibrillation: Secondary | ICD-10-CM | POA: Diagnosis not present

## 2017-08-27 DIAGNOSIS — M85851 Other specified disorders of bone density and structure, right thigh: Secondary | ICD-10-CM | POA: Diagnosis not present

## 2017-08-27 DIAGNOSIS — M8589 Other specified disorders of bone density and structure, multiple sites: Secondary | ICD-10-CM | POA: Diagnosis not present

## 2017-08-27 DIAGNOSIS — Z1231 Encounter for screening mammogram for malignant neoplasm of breast: Secondary | ICD-10-CM | POA: Diagnosis not present

## 2017-09-01 DIAGNOSIS — I4891 Unspecified atrial fibrillation: Secondary | ICD-10-CM | POA: Diagnosis not present

## 2017-09-01 DIAGNOSIS — E538 Deficiency of other specified B group vitamins: Secondary | ICD-10-CM | POA: Diagnosis not present

## 2017-09-12 DIAGNOSIS — I6782 Cerebral ischemia: Secondary | ICD-10-CM | POA: Diagnosis not present

## 2017-09-12 DIAGNOSIS — S299XXA Unspecified injury of thorax, initial encounter: Secondary | ICD-10-CM | POA: Diagnosis not present

## 2017-09-12 DIAGNOSIS — I728 Aneurysm of other specified arteries: Secondary | ICD-10-CM | POA: Diagnosis not present

## 2017-09-12 DIAGNOSIS — S2231XA Fracture of one rib, right side, initial encounter for closed fracture: Secondary | ICD-10-CM | POA: Diagnosis not present

## 2017-09-12 DIAGNOSIS — S161XXA Strain of muscle, fascia and tendon at neck level, initial encounter: Secondary | ICD-10-CM | POA: Diagnosis not present

## 2017-09-12 DIAGNOSIS — M5033 Other cervical disc degeneration, cervicothoracic region: Secondary | ICD-10-CM | POA: Diagnosis not present

## 2017-09-12 DIAGNOSIS — S0990XA Unspecified injury of head, initial encounter: Secondary | ICD-10-CM | POA: Diagnosis not present

## 2017-09-12 DIAGNOSIS — I10 Essential (primary) hypertension: Secondary | ICD-10-CM | POA: Diagnosis not present

## 2017-09-12 DIAGNOSIS — M47812 Spondylosis without myelopathy or radiculopathy, cervical region: Secondary | ICD-10-CM | POA: Diagnosis not present

## 2017-09-12 DIAGNOSIS — M542 Cervicalgia: Secondary | ICD-10-CM | POA: Diagnosis not present

## 2017-09-12 DIAGNOSIS — S0003XA Contusion of scalp, initial encounter: Secondary | ICD-10-CM | POA: Diagnosis not present

## 2017-09-12 DIAGNOSIS — I2510 Atherosclerotic heart disease of native coronary artery without angina pectoris: Secondary | ICD-10-CM | POA: Diagnosis not present

## 2017-09-12 DIAGNOSIS — I700 Atherosclerosis of aorta: Secondary | ICD-10-CM | POA: Diagnosis not present

## 2017-09-12 DIAGNOSIS — Z9861 Coronary angioplasty status: Secondary | ICD-10-CM | POA: Diagnosis not present

## 2017-09-15 DIAGNOSIS — R5383 Other fatigue: Secondary | ICD-10-CM | POA: Diagnosis not present

## 2017-09-15 DIAGNOSIS — G4733 Obstructive sleep apnea (adult) (pediatric): Secondary | ICD-10-CM | POA: Diagnosis not present

## 2017-09-15 DIAGNOSIS — J301 Allergic rhinitis due to pollen: Secondary | ICD-10-CM | POA: Diagnosis not present

## 2017-09-15 DIAGNOSIS — J4520 Mild intermittent asthma, uncomplicated: Secondary | ICD-10-CM | POA: Diagnosis not present

## 2017-09-15 DIAGNOSIS — E559 Vitamin D deficiency, unspecified: Secondary | ICD-10-CM | POA: Diagnosis not present

## 2017-09-30 ENCOUNTER — Ambulatory Visit (INDEPENDENT_AMBULATORY_CARE_PROVIDER_SITE_OTHER): Payer: Medicare Other | Admitting: Cardiology

## 2017-09-30 ENCOUNTER — Encounter: Payer: Self-pay | Admitting: Cardiology

## 2017-09-30 VITALS — BP 100/62 | HR 103 | Ht 65.0 in | Wt 219.0 lb

## 2017-09-30 DIAGNOSIS — I739 Peripheral vascular disease, unspecified: Secondary | ICD-10-CM

## 2017-09-30 DIAGNOSIS — I480 Paroxysmal atrial fibrillation: Secondary | ICD-10-CM

## 2017-09-30 DIAGNOSIS — E785 Hyperlipidemia, unspecified: Secondary | ICD-10-CM | POA: Diagnosis not present

## 2017-09-30 DIAGNOSIS — I4891 Unspecified atrial fibrillation: Secondary | ICD-10-CM

## 2017-09-30 HISTORY — DX: Paroxysmal atrial fibrillation: I48.0

## 2017-09-30 LAB — BASIC METABOLIC PANEL
BUN/Creatinine Ratio: 22 (ref 12–28)
BUN: 22 mg/dL (ref 8–27)
CALCIUM: 11.1 mg/dL — AB (ref 8.7–10.3)
CHLORIDE: 103 mmol/L (ref 96–106)
CO2: 26 mmol/L (ref 20–29)
Creatinine, Ser: 0.99 mg/dL (ref 0.57–1.00)
GFR calc Af Amer: 61 mL/min/{1.73_m2} (ref >59)
GFR calc non Af Amer: 52 mL/min/{1.73_m2} — ABNORMAL LOW (ref >59)
GLUCOSE: 146 mg/dL — AB (ref 65–99)
POTASSIUM: 4.5 mmol/L (ref 3.5–5.2)
Sodium: 143 mmol/L (ref 134–144)

## 2017-09-30 LAB — CBC
HEMOGLOBIN: 13.6 g/dL (ref 11.1–15.9)
Hematocrit: 40.8 % (ref 34.0–46.6)
MCH: 31.1 pg (ref 26.6–33.0)
MCHC: 33.3 g/dL (ref 31.5–35.7)
MCV: 93 fL (ref 79–97)
Platelets: 196 10*3/uL (ref 150–450)
RBC: 4.37 x10E6/uL (ref 3.77–5.28)
RDW: 14.7 % (ref 12.3–15.4)
WBC: 6.6 10*3/uL (ref 3.4–10.8)

## 2017-09-30 LAB — PROTIME-INR
INR: 1 (ref 0.8–1.2)
PROTHROMBIN TIME: 10.9 s (ref 9.1–12.0)

## 2017-09-30 NOTE — Progress Notes (Signed)
Cardiology Consultation:    Date:  09/30/2017   ID:  Shelia Sullivan, DOB [birth year], MRN [id number]  PCP:  Orlinda Blalock, NP  Cardiologist:  [name], MD   Referring MD: Myer Peer, MD   Chief Complaint  Patient presents with  . Atrial Fibrillation  Doing well  History of Present Illness:    Shelia Sullivan is a 84 y.o. female who is being seen today for the evaluation of atrial fibrillation at the request of Myer Peer, MD.  Apparently in April she was in hospital she was diagnosed to have atrial fibrillation.  Also she has some chest pain stress test was done which was abnormal and she ended up having cardiac catheterization which showed no obstructive lesions.  She was discharged home with instruction to follow-up with cardiology in terms of assessing need for anticoagulation.  Since that time she denies have any palpitation no tightness squeezing pressure burning chest.  She move around and does things.  Does not have frequent falls.  She did have a history of GI bleed but lately it has been stable. Past Medical History:  Diagnosis Date  . Acute chest pain 06/08/2017   Last Assessment & Plan:  Consistent with angina pectoris brought on by atrial fibrillation with rapid ventricular response.  Nuclear scan intermediate risk for significant obstructive coronary artery disease.  Options discussed:  We will proceed with diagnostic cardiac catheterization but forego intervention on an ad Hoc basis as she does have history of a GI bleed has been told not to take aspiri  . Anemia   . Anxiety 06/08/2017  . Anxiety and depression   . Arthritis   . Asthma   . Asthma   . Atrophic vaginitis   . CAD (coronary artery disease)   . Cancer ([place])   . Chronic pain 06/08/2017  . Chronic respiratory failure ([place]) 06/08/2017  . CKD (chronic kidney disease)   . COPD (chronic obstructive pulmonary disease) ([place])   . Dyslipidemia 06/09/2017   Last Assessment & Plan:  Low HDL with LDL  76. Low threshold for continued statin therapy  . Esophageal reflux   . Fibrocystic breast changes of both breasts 06/27/2015  . History of GI bleed 06/08/2017   Last Assessment & Plan:  Stable at present with reasonable hemoglobin and hematocrit.  However, as noted above will defer consideration dual antiplatelet therapy at the present time due to need for  direct oral anticoagulant therapy given history of the atrial fibrillation pending review of the results from the diagnostic catheterization  . History of MI (myocardial infarction) 06/08/2017  . Hypercholesteremia   . Hypertension   . Hypothyroid   . Irritable bladder   . Kidney stone   . Morbid obesity ([place]) 06/27/2015  . Myocardial infarction ([place])   . Neuropathy   . Nocturia   . Osteoarthritis   . Peripheral vascular disease ([place]) 06/08/2017  . Rapid atrial fibrillation ([place]) 06/08/2017   Last Assessment & Plan:  Back in sinus rhythm at present but chads Vasc score is at least 4 based on age, gender, history of hypertension and diabetes.  Recommend anticoagulant therapy with a direct oral anticoagulant.  . Scars   . Stable angina pectoris ([place]) 06/08/2017   Added automatically from request for surgery [id number]  . Swelling   . Type 2 diabetes mellitus with stage 3 chronic kidney disease ([place])   . Type 2 diabetes mellitus, without long-term current use of insulin ([place]) 06/08/2017  Last Assessment & Plan:  Target A1c less than 7.0  . Vitamin D deficiency     Past Surgical History:  Procedure Laterality Date  . APPENDECTOMY  1950  . ARTHROPLASTY Right 2002   Dr Reeves Forth  . Arthroscopy of shoulder    . BREAST SURGERY    . CARPAL TUNNEL RELEASE Left 2007  . CATARACT EXTRACTION Bilateral   . CHOLECYSTECTOMY  1980  . HERNIA REPAIR  1985  . LITHOTRIPSY  02/21/2017  . PARTIAL HYSTERECTOMY  1950  . REPLACEMENT TOTAL KNEE Left 02/03/2012  . TONSILLECTOMY  1940    Current Medications: Current Meds  Medication Sig  . ADVAIR DISKUS  250-50 MCG/DOSE AEPB Inhale 1 puff into the lungs 2 (two) times daily at 10 AM and 5 PM.   . albuterol (PROVENTIL HFA;VENTOLIN HFA) 108 (90 Base) MCG/ACT inhaler Inhale 1 puff into the lungs every 6 (six) hours as needed for wheezing or shortness of breath.  . Ascorbic Acid (VITAMIN C) 100 MG tablet Take 100 mg by mouth daily.  . Calcium Carb-Cholecalciferol (CALCIUM 600+D3 PO) Take by mouth daily.  . carvedilol (COREG) 6.25 MG tablet Take 6.25 mg by mouth 2 (two) times daily.   . diclofenac sodium (VOLTAREN) 1 % GEL   . Dulaglutide (TRULICITY) 1.5 KX/3.8HW SOPN once a week.  . DULoxetine (CYMBALTA) 30 MG capsule Take 30 mg by mouth daily.  . Ergocalciferol (VITAMIN D2 PO) Take by mouth daily.  . fluticasone (FLONASE) 50 MCG/ACT nasal spray Place into the nose as needed.  . furosemide (LASIX) 20 MG tablet Take 20 mg by mouth daily.   Marland Kitchen gabapentin (NEURONTIN) 600 MG tablet Take 600 mg by mouth every 8 (eight) hours as needed.  Marland Kitchen LEVEMIR FLEXTOUCH 100 UNIT/ML Pen Inject 50 Units into the skin daily.   Marland Kitchen levothyroxine (SYNTHROID) 50 MCG tablet Take 50 mcg by mouth daily.  Marland Kitchen lisinopril (PRINIVIL,ZESTRIL) 10 MG tablet Take 10 mg by mouth daily.  . Melatonin 5 MG TABS Take 10 mg by mouth at bedtime.  . metFORMIN (GLUCOPHAGE) 500 MG tablet 500 mg daily.  . MORPHINE SULFATE PO Take by mouth every 6 (six) hours as needed.  Marland Kitchen omeprazole (PRILOSEC) 20 MG capsule Take 20 mg by mouth daily.   . pravastatin (PRAVACHOL) 80 MG tablet Take 80 mg by mouth at bedtime.   Marland Kitchen QUEtiapine (SEROQUEL) 200 MG tablet Take 200 mg by mouth daily.   . SURE COMFORT PEN NEEDLES 32G X 6 MM MISC   . trimethoprim (TRIMPEX) 100 MG tablet Take by mouth daily.  . [DISCONTINUED] eszopiclone (LUNESTA) 2 MG TABS tablet Take 2 mg by mouth at bedtime as needed.      Allergies:   Codeine; Nsaids; and Tolmetin   Social History   Socioeconomic History  . Marital status: Widowed    Spouse name: Not on file  . Number of children: 4    . Years of education: Not on file  . Highest education level: Not on file  Occupational History  . Not on file  Social Needs  . Financial resource strain: Not on file  . Food insecurity:    Worry: Not on file    Inability: Not on file  . Transportation needs:    Medical: Not on file    Non-medical: Not on file  Tobacco Use  . Smoking status: Former Smoker    Packs/day: 1.00    Years: 50.00    Pack years: 50.00    Last attempt to  quit: 03/1998    Years since quitting: 19.5  . Smokeless tobacco: Never Used  Substance and Sexual Activity  . Alcohol use: No  . Drug use: No  . Sexual activity: Not on file  Lifestyle  . Physical activity:    Days per week: Not on file    Minutes per session: Not on file  . Stress: Not on file  Relationships  . Social connections:    Talks on phone: Not on file    Gets together: Not on file    Attends religious service: Not on file    Active member of club or organization: Not on file    Attends meetings of clubs or organizations: Not on file    Relationship status: Not on file  Other Topics Concern  . Not on file  Social History Narrative   Daughter and grandson live with patient.     Family History: The patient's family history includes Arthritis in her father; Breast cancer in her daughter; Colon cancer in her father; Diabetes in her sister; Heart disease in her father; Hyperlipidemia in her mother; Hypertension in her father and mother; Prostate cancer in her father; Stroke in her father. ROS:   Please see the history of present illness.    All 14 point review of systems negative except as described per history of present illness.  EKGs/Labs/Other Studies Reviewed:    The following studies were reviewed today: Cardiac catheterization review done in April showed nonobstructive disease  EKG:  EKG is  ordered today.  The ekg ordered today demonstrates normal sinus rhythm rate 69 normal P interval normal QS complex duration morphology  except for some Q waves inferiorly but not barely reaching criteria poor R wave progression anterior precordium no ST-T segment changes  Recent Labs: No results found for requested labs within last 8760 hours.  Recent Lipid Panel    Component Value Date/Time   CHOL 151 09/02/2013 1300   TRIG 171 (H) 09/02/2013 1300   HDL 25 (L) 09/02/2013 1300   CHOLHDL 6.0 09/02/2013 1300   VLDL 34 09/02/2013 1300   LDLCALC 92 09/02/2013 1300    Physical Exam:    VS:  BP 100/62 (BP Location: Left Arm, Patient Position: Sitting, Cuff Size: Normal)   Pulse (!) 103   Ht 5\' 5"  (1.651 m)   Wt 219 lb (99.3 kg)   SpO2 91%   BMI 36.44 kg/m     Wt Readings from Last 3 Encounters:  09/30/17 219 lb (99.3 kg)  09/01/17 223 lb (101.2 kg)     GEN:  Well nourished, well developed in no acute distress HEENT: Normal NECK: No JVD; No carotid bruits LYMPHATICS: No lymphadenopathy CARDIAC: RRR, no murmurs, no rubs, no gallops RESPIRATORY:  Clear to auscultation without rales, wheezing or rhonchi  ABDOMEN: Soft, non-tender, non-distended MUSCULOSKELETAL:  No edema; No deformity  SKIN: Warm and dry NEUROLOGIC:  Alert and oriented x 3 PSYCHIATRIC:  Normal affect   ASSESSMENT:    1. Rapid atrial fibrillation (H[facility])   2. Paroxysmal atrial fibrillation ([place])   3. Peripheral vascular disease ([place])   4. Dyslipidemia    PLAN:    In order of problems listed above:  1. Paroxysmal atrial fibrillation.  Her chads 2 Vascor equals 4.  Ideally she need to be anticoagulated.  We will prepare her for anti-coagulation I will check her CBC as well as we will check her stool for guaiac.  Chem-7 will be checked as well based on that  we will decide about anticoagulation.  I will also ask her to wear event recorder to see how much if any atrial fibrillation still has she did need to be anticoagulated however there is some significant risk in her case with anti-coagulation therefore I need better assessment of frequency and  duration of atrial fibrillation. 2. Peripheral vascular disease have not much information about this obviously will investigate this time to see if there is need to do any additional testing. 3. Chest pain she denies having any recent cardiac catheterization showed nonobstructive disease 4. Dyslipidemia she is on pravastatin 80 which I will continue for now.  See her back in the office in about a month or sooner if she get a problem.   Medication Adjustments/Labs and Tests Ordered: Current medicines are reviewed at length with the patient today.  Concerns regarding medicines are outlined above.  Orders Placed This Encounter  Procedures  . Fecal occult blood, imunochemical  . CBC  . INR/PT  . Basic metabolic panel  . Cardiac event monitor  . EKG 12-Lead   No orders of the defined types were placed in this encounter.   Signed, Park Liter, MD, [facility]. 09/30/2017 1:06 PM    [place]

## 2017-09-30 NOTE — Patient Instructions (Signed)
Medication Instructions:  Your physician recommends that you continue on your current medications as directed. Please refer to the Current Medication list given to you today.   Labwork: Your physician recommends that you return for lab work today: BMP, CBC, and INR.   Testing/Procedures: Your physician has recommended that you wear a holter monitor. Holter monitors are medical devices that record the heart's electrical activity. Doctors most often use these monitors to diagnose arrhythmias. Arrhythmias are problems with the speed or rhythm of the heartbeat. The monitor is a small, portable device. You can wear one while you do your normal daily activities. This is usually used to diagnose what is causing palpitations/syncope (passing out). Wear for 3 0 days.   Follow-Up:  Your physician wants you to follow-up in: 6 months. You will receive a reminder letter in the mail two months in advance. If you don't receive a letter, please call our office to schedule the follow-up appointment.   Any Other Special Instructions Will Be Listed Below (If Applicable).     If you need a refill on your cardiac medications before your next appointment, please call your pharmacy.   Cardiac Event Monitoring A cardiac event monitor is a small recording device that is used to detect abnormal heart rhythms (arrhythmias). The monitor is used to record your heart rhythm when you have symptoms, such as:  Fast heartbeats (palpitations), such as heart racing or fluttering.  Dizziness.  Fainting or light-headedness.  Unexplained weakness.  Some monitors are wired to electrodes placed on your chest. Electrodes are flat, sticky disks that attach to your skin. Other monitors may be hand-held or worn on the wrist. The monitor can be worn for up to 30 days. If the monitor is attached to your chest, a technician will prepare your chest for the electrode placement and show you how to work the monitor. Take time to  practice using the monitor before you leave the office. Make sure you understand how to send the information from the monitor to your health care provider. In some cases, you may need to use a landline telephone instead of a cell phone. What are the risks? Generally, this device is safe to use, but it possible that the skin under the electrodes will become irritated. How to use your cardiac event monitor  Wear your monitor at all times, except when you are in water: ? Do not let the monitor get wet. ? Take the monitor off when you bathe. Do not swim or use a hot tub with it on.  Keep your skin clean. Do not put body lotion or moisturizer on your chest.  Change the electrodes as told by your health care provider or any time they stop sticking to your skin. You may need to use medical tape to keep them on.  Try to put the electrodes in slightly different places on your chest to help prevent skin irritation. They must remain in the area under your left breast and in the upper right section of your chest.  Make sure the monitor is safely clipped to your clothing or in a location close to your body that your health care provider recommends.  Press the button to record as soon as you feel heart-related symptoms, such as: ? Dizziness. ? Weakness. ? Light-headedness. ? Palpitations. ? Thumping or pounding in your chest. ? Shortness of breath. ? Unexplained weakness.  Keep a diary of your activities, such as walking, doing chores, and taking medicine. It is very important  to note what you were doing when you pushed the button to record your symptoms. This will help your health care provider determine what might be contributing to your symptoms.  Send the recorded information as recommended by your health care provider. It may take some time for your health care provider to process the results.  Change the batteries as told by your health care provider.  Keep electronic devices away from your  monitor. This includes: ? Tablets. ? MP3 players. ? Cell phones.  While wearing your monitor you should avoid: ? Electric blankets. ? Armed forces operational officer. ? Electric toothbrushes. ? Microwave ovens. ? Magnets. ? Metal detectors. Get help right away if:  You have chest pain.  You have extreme difficulty breathing or shortness of breath.  You develop a very fast heartbeat that persists.  You develop dizziness that does not go away.  You faint or constantly feel like you are about to faint. Summary  A cardiac event monitor is a small recording device that is used to help detect abnormal heart rhythms (arrhythmias).  The monitor is used to record your heart rhythm when you have heart-related symptoms.  Make sure you understand how to send the information from the monitor to your health care provider.  It is important to press the button on the monitor when you have any heart-related symptoms.  Keep a diary of your activities, such as walking, doing chores, and taking medicine. It is very important to note what you were doing when you pushed the button to record your symptoms. This will help your health care provider learn what might be causing your symptoms. This information is not intended to replace advice given to you by your health care provider. Make sure you discuss any questions you have with your health care provider. Document Released: 11/21/2007 Document Revised: 01/27/2016 Document Reviewed: 01/27/2016 Elsevier Interactive Patient Education  2017 [employer].

## 2017-10-01 ENCOUNTER — Telehealth: Payer: Self-pay

## 2017-10-01 DIAGNOSIS — I4891 Unspecified atrial fibrillation: Secondary | ICD-10-CM | POA: Diagnosis not present

## 2017-10-01 NOTE — Telephone Encounter (Signed)
Attempted to call with lab results.

## 2017-10-02 ENCOUNTER — Other Ambulatory Visit: Payer: Self-pay

## 2017-10-02 ENCOUNTER — Telehealth: Payer: Self-pay

## 2017-10-02 DIAGNOSIS — I4891 Unspecified atrial fibrillation: Secondary | ICD-10-CM

## 2017-10-02 NOTE — Telephone Encounter (Signed)
Attempted to reach out to the patient twice today as well as family member listed on the DPR with no answer. Letter will be mailed with information regarding labs and instructions.

## 2017-10-04 LAB — FECAL OCCULT BLOOD, IMMUNOCHEMICAL: FECAL OCCULT BLD: POSITIVE — AB

## 2017-10-08 ENCOUNTER — Ambulatory Visit (INDEPENDENT_AMBULATORY_CARE_PROVIDER_SITE_OTHER): Payer: Medicare Other

## 2017-10-08 ENCOUNTER — Other Ambulatory Visit: Payer: Self-pay

## 2017-10-08 DIAGNOSIS — I481 Persistent atrial fibrillation: Secondary | ICD-10-CM | POA: Diagnosis not present

## 2017-10-08 DIAGNOSIS — I4891 Unspecified atrial fibrillation: Secondary | ICD-10-CM

## 2017-10-08 DIAGNOSIS — R195 Other fecal abnormalities: Secondary | ICD-10-CM

## 2017-10-09 LAB — HEMOGLOBIN AND HEMATOCRIT, BLOOD
HEMATOCRIT: 42.6 % (ref 34.0–46.6)
HEMOGLOBIN: 13.7 g/dL (ref 11.1–15.9)

## 2017-10-10 ENCOUNTER — Telehealth: Payer: Self-pay

## 2017-10-10 NOTE — Telephone Encounter (Signed)
-----   Message from Park Liter, MD sent at 10/10/2017  1:42 PM EDT ----- H/h looks good

## 2017-10-10 NOTE — Telephone Encounter (Signed)
Patient was notified of results.  

## 2017-10-14 ENCOUNTER — Other Ambulatory Visit: Payer: Self-pay

## 2017-10-14 MED ORDER — APIXABAN 5 MG PO TABS: 5.0000 mg | ORAL_TABLET | Freq: Two times a day (BID) | ORAL | 2 refills | Status: DC

## 2017-10-20 ENCOUNTER — Other Ambulatory Visit: Payer: Self-pay | Admitting: *Deleted

## 2017-10-20 MED ORDER — APIXABAN 5 MG PO TABS: 5.0000 mg | ORAL_TABLET | Freq: Two times a day (BID) | ORAL | 2 refills | Status: DC

## 2017-10-20 NOTE — Telephone Encounter (Signed)
Refill sent for eliquis 5 mg sent to [facility].

## 2017-10-23 ENCOUNTER — Ambulatory Visit: Payer: Medicare Other | Admitting: Sports Medicine

## 2017-10-28 ENCOUNTER — Telehealth: Payer: Self-pay | Admitting: Cardiology

## 2017-10-28 DIAGNOSIS — E7800 Pure hypercholesterolemia, unspecified: Secondary | ICD-10-CM | POA: Diagnosis not present

## 2017-10-28 DIAGNOSIS — E1122 Type 2 diabetes mellitus with diabetic chronic kidney disease: Secondary | ICD-10-CM | POA: Diagnosis not present

## 2017-10-28 DIAGNOSIS — R319 Hematuria, unspecified: Secondary | ICD-10-CM | POA: Diagnosis not present

## 2017-10-28 DIAGNOSIS — Z79899 Other long term (current) drug therapy: Secondary | ICD-10-CM | POA: Diagnosis not present

## 2017-10-28 DIAGNOSIS — M25551 Pain in right hip: Secondary | ICD-10-CM | POA: Diagnosis not present

## 2017-10-28 DIAGNOSIS — E559 Vitamin D deficiency, unspecified: Secondary | ICD-10-CM | POA: Diagnosis not present

## 2017-10-28 DIAGNOSIS — E538 Deficiency of other specified B group vitamins: Secondary | ICD-10-CM | POA: Diagnosis not present

## 2017-10-28 NOTE — Telephone Encounter (Signed)
Patient reports stopping eliquis 4 days ago due to blood in her urine. Patient was seen in primary care office today, they took a sample. We will request those results. Patient asking if she should restart eliquis. Dr. Bettina Gavia consulted about this due to Dr. Fraser Din being out. Will call patient about urology consult.

## 2017-10-28 NOTE — Telephone Encounter (Signed)
Took her monitor off because she was having problems with it/also never started eliquis because of blood in her urine

## 2017-10-28 NOTE — Telephone Encounter (Signed)
Patient advised to call 1-800 number on monitor box for assistance with the monitor. Patient reports already having a urologist appointment patient advised to follow through with that before starting eliquis back per Dr. Bettina Gavia. Patient verbally understands.

## 2017-11-04 DIAGNOSIS — M1611 Unilateral primary osteoarthritis, right hip: Secondary | ICD-10-CM | POA: Diagnosis not present

## 2017-11-05 ENCOUNTER — Encounter: Payer: Self-pay | Admitting: Sports Medicine

## 2017-11-05 ENCOUNTER — Ambulatory Visit (INDEPENDENT_AMBULATORY_CARE_PROVIDER_SITE_OTHER): Payer: Medicare Other | Admitting: Sports Medicine

## 2017-11-05 VITALS — BP 138/80 | HR 80 | Resp 16

## 2017-11-05 DIAGNOSIS — M2040 Other hammer toe(s) (acquired), unspecified foot: Secondary | ICD-10-CM

## 2017-11-05 DIAGNOSIS — L84 Corns and callosities: Secondary | ICD-10-CM

## 2017-11-05 DIAGNOSIS — M79675 Pain in left toe(s): Secondary | ICD-10-CM

## 2017-11-05 DIAGNOSIS — I739 Peripheral vascular disease, unspecified: Secondary | ICD-10-CM

## 2017-11-05 DIAGNOSIS — B351 Tinea unguium: Secondary | ICD-10-CM

## 2017-11-05 DIAGNOSIS — E1140 Type 2 diabetes mellitus with diabetic neuropathy, unspecified: Secondary | ICD-10-CM

## 2017-11-05 DIAGNOSIS — M79674 Pain in right toe(s): Secondary | ICD-10-CM | POA: Diagnosis not present

## 2017-11-05 NOTE — Progress Notes (Signed)
Subjective: Shelia Sullivan is a 84 y.o. female patient with history of diabetes who presents to office today complaining of long, painful nails and callus while ambulating in shoes; unable to trim. Patient states that the glucose reading was 130. Last saw Dr Leroy Sea 2 weeks ago and does not recall last A1c.  Patient denies any other new issues at this time.  Patient Active Problem List   Diagnosis Date Noted  . Paroxysmal atrial fibrillation ([place]) 09/30/2017  . Dyslipidemia 06/09/2017  . Type 2 diabetes mellitus, without long-term current use of insulin ([place]) 06/08/2017  . Stable angina pectoris ([place]) 06/08/2017  . Rapid atrial fibrillation ([place]) 06/08/2017  . Peripheral vascular disease ([place]) 06/08/2017  . History of GI bleed 06/08/2017  . History of MI (myocardial infarction) 06/08/2017  . Kidney stone 06/08/2017  . Chronic respiratory failure ([place]) 06/08/2017  . Chronic pain 06/08/2017  . Asthma 06/08/2017  . Anxiety 06/08/2017  . Acute chest pain 06/08/2017  . Morbid obesity ([place]) 06/27/2015  . Fibrocystic breast changes of both breasts 06/27/2015   Current Outpatient Medications on File Prior to Visit  Medication Sig Dispense Refill  . ADVAIR DISKUS 250-50 MCG/DOSE AEPB Inhale 1 puff into the lungs 2 (two) times daily at 10 AM and 5 PM.     . albuterol (PROVENTIL HFA;VENTOLIN HFA) 108 (90 Base) MCG/ACT inhaler Inhale 1 puff into the lungs every 6 (six) hours as needed for wheezing or shortness of breath.    Marland Kitchen apixaban (ELIQUIS) 5 MG TABS tablet Take 1 tablet (5 mg total) by mouth 2 (two) times daily. 180 tablet 2  . Ascorbic Acid (VITAMIN C) 100 MG tablet Take 100 mg by mouth daily.    . Calcium Carb-Cholecalciferol (CALCIUM 600+D3 PO) Take by mouth daily.    . carvedilol (COREG) 6.25 MG tablet Take 6.25 mg by mouth 2 (two) times daily.     . diclofenac sodium (VOLTAREN) 1 % GEL     . Dulaglutide (TRULICITY) 1.5 KX/3.8HW SOPN once a week.    . DULoxetine (CYMBALTA) 30 MG capsule  Take 30 mg by mouth daily.    . Ergocalciferol (VITAMIN D2 PO) Take by mouth daily.    . fluticasone (FLONASE) 50 MCG/ACT nasal spray Place into the nose as needed.    . furosemide (LASIX) 20 MG tablet Take 20 mg by mouth daily.     Marland Kitchen gabapentin (NEURONTIN) 600 MG tablet Take 600 mg by mouth every 8 (eight) hours as needed.    Marland Kitchen LEVEMIR FLEXTOUCH 100 UNIT/ML Pen Inject 50 Units into the skin daily.     Marland Kitchen levothyroxine (SYNTHROID) 50 MCG tablet Take 50 mcg by mouth daily.    Marland Kitchen lisinopril (PRINIVIL,ZESTRIL) 10 MG tablet Take 10 mg by mouth daily.    . Melatonin 5 MG TABS Take 10 mg by mouth at bedtime.    . metFORMIN (GLUCOPHAGE) 500 MG tablet 500 mg daily.    . MORPHINE SULFATE PO Take by mouth every 6 (six) hours as needed.    Marland Kitchen omeprazole (PRILOSEC) 20 MG capsule Take 20 mg by mouth daily.     . pravastatin (PRAVACHOL) 80 MG tablet Take 80 mg by mouth at bedtime.     Marland Kitchen QUEtiapine (SEROQUEL) 200 MG tablet Take 200 mg by mouth daily.     . SURE COMFORT PEN NEEDLES 32G X 6 MM MISC     . trimethoprim (TRIMPEX) 100 MG tablet Take by mouth daily.    Marland Kitchen VITAMIN A PO Take  by mouth daily.     No current facility-administered medications on file prior to visit.    Allergies  Allergen Reactions  . Codeine   . Nsaids Other (See Comments)    GI Bleeding  . Tolmetin Other (See Comments)    GI Bleeding    Recent Results (from the past 2160 hour(s))  CBC     Status: None   Collection Time: 09/30/17 11:02 AM  Result Value Ref Range   WBC 6.6 3.4 - 10.8 x10E3/uL   RBC 4.37 3.77 - 5.28 x10E6/uL   Hemoglobin 13.6 11.1 - 15.9 g/dL   Hematocrit 40.8 34.0 - 46.6 %   MCV 93 79 - 97 fL   MCH 31.1 26.6 - 33.0 pg   MCHC 33.3 31.5 - 35.7 g/dL   RDW 14.7 12.3 - 15.4 %   Platelets 196 150 - 450 x10E3/uL  INR/PT     Status: None   Collection Time: 09/30/17 11:02 AM  Result Value Ref Range   INR 1.0 0.8 - 1.2    Comment: Reference interval is for non-anticoagulated patients. Suggested INR therapeutic  range for Vitamin K antagonist therapy:    Standard Dose (moderate intensity                   therapeutic range):       2.0 - 3.0    Higher intensity therapeutic range       2.5 - 3.5    Prothrombin Time 10.9 9.1 - 12.0 sec  Basic metabolic panel     Status: Abnormal   Collection Time: 09/30/17 11:02 AM  Result Value Ref Range   Glucose 146 (H) 65 - 99 mg/dL   BUN 22 8 - 27 mg/dL   Creatinine, Ser 0.99 0.57 - 1.00 mg/dL   GFR calc non Af Amer 52 (L) >59 mL/min/1.73   GFR calc Af Amer 61 >59 mL/min/1.73   BUN/Creatinine Ratio 22 12 - 28   Sodium 143 134 - 144 mmol/L   Potassium 4.5 3.5 - 5.2 mmol/L   Chloride 103 96 - 106 mmol/L   CO2 26 20 - 29 mmol/L   Calcium 11.1 (H) 8.7 - 10.3 mg/dL  Fecal occult blood, imunochemical     Status: Abnormal   Collection Time: 10/01/17 12:00 AM  Result Value Ref Range   Fecal Occult Bld Positive (A) Negative  Hemoglobin and hematocrit, blood     Status: None   Collection Time: 10/08/17  1:44 PM  Result Value Ref Range   Hemoglobin 13.7 11.1 - 15.9 g/dL   Hematocrit 42.6 34.0 - 46.6 %    Objective: General: Patient is awake, alert, and oriented x 3 and in no acute distress.  Cane assisted gait.   Integument: Skin is dry and supple bilateral. Nails are tender, long, thickened and dystrophic with subungual debris, consistent with onychomycosis, 2-5 bilateral.  There is no hallux nails bilateral from previous removal. No signs of infection. + callus at tips of 3rd toes bilateral. Remaining integument unremarkable.  Vasculature:  Dorsalis Pedis pulse 1/4 bilateral. Posterior Tibial pulse  0/4 bilateral. Capillary fill time <5 sec 1-5 bilateral. Scant hair growth to the level of the digits.Temperature gradient mildly decreased bilateral. + varicosities present bilateral. Trace edema present bilateral, L>R chronic.   Neurology: The patient has absent sensation measured with a 5.07/10g Semmes Weinstein Monofilament at all pedal sites bilateral .  Vibratory sensation diminished bilateral with tuning fork. No Babinski sign present bilateral.   Musculoskeletal:  Asymptomatic hammertoe and fat pad atrophy with planus pedal deformities noted bilateral. Muscular strength 5/5 in all lower extremity muscular groups bilateral without pain on range of motion however stiffness likely arthritis present. No tenderness with calf compression bilateral.  Assessment and Plan: Problem List Items Addressed This Visit    None    Visit Diagnoses    Pain due to onychomycosis of toenails of both feet    -  Primary   Callus of foot       Hammer toe, unspecified laterality       Type 2 diabetes, controlled, with neuropathy (HCC)       PVD (peripheral vascular disease) ([place])         -Examined patient. -Discussed and educated patient on diabetic foot care, especially with  regards to the vascular, neurological and musculoskeletal systems.  -Mechanically debrided callus x 2 using rotary bur and all nails 2-5 bilateral using sterile nail nipper and filed with dremel without incident  -Continue with diabetic shoes -Answered all patient questions -Patient to return  in 3 months for at risk foot care -Patient advised to call the office if any problems or questions arise in the meantime.  Landis Martins, DPM

## 2017-11-06 ENCOUNTER — Telehealth: Payer: Self-pay | Admitting: Emergency Medicine

## 2017-11-06 DIAGNOSIS — N3281 Overactive bladder: Secondary | ICD-10-CM | POA: Diagnosis not present

## 2017-11-06 DIAGNOSIS — R310 Gross hematuria: Secondary | ICD-10-CM | POA: Diagnosis not present

## 2017-11-06 DIAGNOSIS — N200 Calculus of kidney: Secondary | ICD-10-CM | POA: Diagnosis not present

## 2017-11-06 DIAGNOSIS — N390 Urinary tract infection, site not specified: Secondary | ICD-10-CM | POA: Diagnosis not present

## 2017-11-06 DIAGNOSIS — R319 Hematuria, unspecified: Secondary | ICD-10-CM | POA: Diagnosis not present

## 2017-11-06 NOTE — Telephone Encounter (Signed)
Left message for patient to return call to follow up about previous phone encounter.

## 2017-11-06 NOTE — Telephone Encounter (Addendum)
Patient reports having a appointment with urologist but she will follow up with urology today to verify appointment. Patient reports being off eliquis for 2 weeks now. Patient reports blood in urine for 1 month. Will inform Dr. Agustin Cree. I have requested lab results and urine results from pcp again.

## 2017-11-07 ENCOUNTER — Telehealth: Payer: Self-pay | Admitting: Cardiology

## 2017-11-07 NOTE — Telephone Encounter (Signed)
Returned patient call regarding her urology appointment. Patient will notify us of results. Dr. Agustin Cree aware of situation.

## 2017-11-07 NOTE — Telephone Encounter (Signed)
Patient returned your call.

## 2017-11-20 DIAGNOSIS — M25551 Pain in right hip: Secondary | ICD-10-CM | POA: Diagnosis not present

## 2017-11-20 DIAGNOSIS — M1611 Unilateral primary osteoarthritis, right hip: Secondary | ICD-10-CM | POA: Diagnosis not present

## 2017-11-21 ENCOUNTER — Telehealth: Payer: Self-pay | Admitting: *Deleted

## 2017-11-21 DIAGNOSIS — Z8719 Personal history of other diseases of the digestive system: Secondary | ICD-10-CM

## 2017-11-21 NOTE — Telephone Encounter (Signed)
Patient informed of event monitor results and denies ever having a syncopal episode while wearing the monitor. Patient asking if she should restart her eliquis 5 mg twice daily. Informed her that Dr. Agustin Cree wants to recheck a CBC before restarting this medication. Patient agreeable and states she will go to the [place] office on Tuesday, 11/25/17 to have this drawn. Advised patient she would be contacted with these results and advised whether or not restart eliquis. Patient verbalized understanding. No further questions.

## 2017-11-21 NOTE — Telephone Encounter (Signed)
-----   Message from Park Liter, MD sent at 11/21/2017 11:31 AM EDT ----- Monitor looks good,  She had syncope while wearing monitor,  Normal rhythm

## 2017-11-23 DIAGNOSIS — Z794 Long term (current) use of insulin: Secondary | ICD-10-CM | POA: Diagnosis not present

## 2017-11-23 DIAGNOSIS — F419 Anxiety disorder, unspecified: Secondary | ICD-10-CM | POA: Diagnosis not present

## 2017-11-23 DIAGNOSIS — K219 Gastro-esophageal reflux disease without esophagitis: Secondary | ICD-10-CM | POA: Diagnosis not present

## 2017-11-23 DIAGNOSIS — R079 Chest pain, unspecified: Secondary | ICD-10-CM | POA: Diagnosis not present

## 2017-11-23 DIAGNOSIS — Z7901 Long term (current) use of anticoagulants: Secondary | ICD-10-CM | POA: Diagnosis not present

## 2017-11-23 DIAGNOSIS — J9610 Chronic respiratory failure, unspecified whether with hypoxia or hypercapnia: Secondary | ICD-10-CM | POA: Diagnosis not present

## 2017-11-23 DIAGNOSIS — Z79899 Other long term (current) drug therapy: Secondary | ICD-10-CM | POA: Diagnosis not present

## 2017-11-23 DIAGNOSIS — Z7982 Long term (current) use of aspirin: Secondary | ICD-10-CM | POA: Diagnosis not present

## 2017-11-23 DIAGNOSIS — I252 Old myocardial infarction: Secondary | ICD-10-CM | POA: Diagnosis not present

## 2017-11-23 DIAGNOSIS — I10 Essential (primary) hypertension: Secondary | ICD-10-CM | POA: Diagnosis not present

## 2017-11-23 DIAGNOSIS — Z9981 Dependence on supplemental oxygen: Secondary | ICD-10-CM | POA: Diagnosis not present

## 2017-11-23 DIAGNOSIS — E1151 Type 2 diabetes mellitus with diabetic peripheral angiopathy without gangrene: Secondary | ICD-10-CM | POA: Diagnosis not present

## 2017-11-23 DIAGNOSIS — Z87891 Personal history of nicotine dependence: Secondary | ICD-10-CM | POA: Diagnosis not present

## 2017-11-23 DIAGNOSIS — G8929 Other chronic pain: Secondary | ICD-10-CM | POA: Diagnosis not present

## 2017-11-23 DIAGNOSIS — J208 Acute bronchitis due to other specified organisms: Secondary | ICD-10-CM | POA: Diagnosis not present

## 2017-11-23 DIAGNOSIS — J45909 Unspecified asthma, uncomplicated: Secondary | ICD-10-CM | POA: Diagnosis not present

## 2017-11-23 DIAGNOSIS — Z7951 Long term (current) use of inhaled steroids: Secondary | ICD-10-CM | POA: Diagnosis not present

## 2017-11-23 DIAGNOSIS — Z87442 Personal history of urinary calculi: Secondary | ICD-10-CM | POA: Diagnosis not present

## 2017-11-24 ENCOUNTER — Ambulatory Visit: Payer: Self-pay | Admitting: Cardiology

## 2017-11-24 DIAGNOSIS — R310 Gross hematuria: Secondary | ICD-10-CM | POA: Diagnosis not present

## 2017-11-24 DIAGNOSIS — N390 Urinary tract infection, site not specified: Secondary | ICD-10-CM | POA: Diagnosis not present

## 2017-11-25 DIAGNOSIS — R319 Hematuria, unspecified: Secondary | ICD-10-CM | POA: Diagnosis not present

## 2017-11-25 DIAGNOSIS — R310 Gross hematuria: Secondary | ICD-10-CM | POA: Diagnosis not present

## 2017-11-26 ENCOUNTER — Ambulatory Visit: Payer: Self-pay | Admitting: Cardiology

## 2017-12-04 ENCOUNTER — Other Ambulatory Visit: Payer: Self-pay

## 2017-12-04 DIAGNOSIS — Z6833 Body mass index (BMI) 33.0-33.9, adult: Secondary | ICD-10-CM | POA: Diagnosis not present

## 2017-12-04 DIAGNOSIS — F4321 Adjustment disorder with depressed mood: Secondary | ICD-10-CM | POA: Diagnosis not present

## 2017-12-04 DIAGNOSIS — Z8719 Personal history of other diseases of the digestive system: Secondary | ICD-10-CM | POA: Diagnosis not present

## 2017-12-05 LAB — CBC
HEMATOCRIT: 40.6 % (ref 34.0–46.6)
HEMOGLOBIN: 13.3 g/dL (ref 11.1–15.9)
MCH: 30.3 pg (ref 26.6–33.0)
MCHC: 32.8 g/dL (ref 31.5–35.7)
MCV: 93 fL (ref 79–97)
Platelets: 229 10*3/uL (ref 150–450)
RBC: 4.39 x10E6/uL (ref 3.77–5.28)
RDW: 13.3 % (ref 12.3–15.4)
WBC: 13.3 10*3/uL — ABNORMAL HIGH (ref 3.4–10.8)

## 2017-12-06 DIAGNOSIS — I252 Old myocardial infarction: Secondary | ICD-10-CM | POA: Diagnosis not present

## 2017-12-06 DIAGNOSIS — R109 Unspecified abdominal pain: Secondary | ICD-10-CM | POA: Diagnosis not present

## 2017-12-06 DIAGNOSIS — N390 Urinary tract infection, site not specified: Secondary | ICD-10-CM | POA: Diagnosis present

## 2017-12-06 DIAGNOSIS — Z9049 Acquired absence of other specified parts of digestive tract: Secondary | ICD-10-CM | POA: Diagnosis not present

## 2017-12-06 DIAGNOSIS — N183 Chronic kidney disease, stage 3 (moderate): Secondary | ICD-10-CM | POA: Diagnosis not present

## 2017-12-06 DIAGNOSIS — R0789 Other chest pain: Secondary | ICD-10-CM | POA: Diagnosis not present

## 2017-12-06 DIAGNOSIS — E1151 Type 2 diabetes mellitus with diabetic peripheral angiopathy without gangrene: Secondary | ICD-10-CM | POA: Diagnosis present

## 2017-12-06 DIAGNOSIS — R0602 Shortness of breath: Secondary | ICD-10-CM | POA: Diagnosis not present

## 2017-12-06 DIAGNOSIS — Z79891 Long term (current) use of opiate analgesic: Secondary | ICD-10-CM | POA: Diagnosis not present

## 2017-12-06 DIAGNOSIS — D71 Functional disorders of polymorphonuclear neutrophils: Secondary | ICD-10-CM | POA: Diagnosis not present

## 2017-12-06 DIAGNOSIS — E1165 Type 2 diabetes mellitus with hyperglycemia: Secondary | ICD-10-CM | POA: Diagnosis present

## 2017-12-06 DIAGNOSIS — Z7901 Long term (current) use of anticoagulants: Secondary | ICD-10-CM | POA: Diagnosis not present

## 2017-12-06 DIAGNOSIS — J45909 Unspecified asthma, uncomplicated: Secondary | ICD-10-CM | POA: Diagnosis not present

## 2017-12-06 DIAGNOSIS — E119 Type 2 diabetes mellitus without complications: Secondary | ICD-10-CM | POA: Diagnosis not present

## 2017-12-06 DIAGNOSIS — Z87891 Personal history of nicotine dependence: Secondary | ICD-10-CM | POA: Diagnosis not present

## 2017-12-06 DIAGNOSIS — J441 Chronic obstructive pulmonary disease with (acute) exacerbation: Secondary | ICD-10-CM | POA: Diagnosis not present

## 2017-12-06 DIAGNOSIS — Z7982 Long term (current) use of aspirin: Secondary | ICD-10-CM | POA: Diagnosis not present

## 2017-12-06 DIAGNOSIS — Z794 Long term (current) use of insulin: Secondary | ICD-10-CM | POA: Diagnosis not present

## 2017-12-06 DIAGNOSIS — G8929 Other chronic pain: Secondary | ICD-10-CM | POA: Diagnosis present

## 2017-12-06 DIAGNOSIS — K219 Gastro-esophageal reflux disease without esophagitis: Secondary | ICD-10-CM | POA: Diagnosis present

## 2017-12-06 DIAGNOSIS — Z9981 Dependence on supplemental oxygen: Secondary | ICD-10-CM | POA: Diagnosis not present

## 2017-12-06 DIAGNOSIS — N3000 Acute cystitis without hematuria: Secondary | ICD-10-CM | POA: Diagnosis not present

## 2017-12-06 DIAGNOSIS — J440 Chronic obstructive pulmonary disease with acute lower respiratory infection: Secondary | ICD-10-CM | POA: Diagnosis not present

## 2017-12-06 DIAGNOSIS — J189 Pneumonia, unspecified organism: Secondary | ICD-10-CM | POA: Diagnosis present

## 2017-12-06 DIAGNOSIS — Z7984 Long term (current) use of oral hypoglycemic drugs: Secondary | ICD-10-CM | POA: Diagnosis not present

## 2017-12-06 DIAGNOSIS — E1122 Type 2 diabetes mellitus with diabetic chronic kidney disease: Secondary | ICD-10-CM | POA: Diagnosis not present

## 2017-12-06 DIAGNOSIS — I482 Chronic atrial fibrillation, unspecified: Secondary | ICD-10-CM | POA: Diagnosis present

## 2017-12-06 DIAGNOSIS — I700 Atherosclerosis of aorta: Secondary | ICD-10-CM | POA: Diagnosis not present

## 2017-12-06 DIAGNOSIS — B9620 Unspecified Escherichia coli [E. coli] as the cause of diseases classified elsewhere: Secondary | ICD-10-CM | POA: Diagnosis present

## 2017-12-06 DIAGNOSIS — G4733 Obstructive sleep apnea (adult) (pediatric): Secondary | ICD-10-CM | POA: Diagnosis present

## 2017-12-06 DIAGNOSIS — J9610 Chronic respiratory failure, unspecified whether with hypoxia or hypercapnia: Secondary | ICD-10-CM | POA: Diagnosis not present

## 2017-12-06 DIAGNOSIS — I10 Essential (primary) hypertension: Secondary | ICD-10-CM | POA: Diagnosis present

## 2017-12-06 DIAGNOSIS — J45901 Unspecified asthma with (acute) exacerbation: Secondary | ICD-10-CM | POA: Diagnosis present

## 2017-12-06 DIAGNOSIS — J181 Lobar pneumonia, unspecified organism: Secondary | ICD-10-CM | POA: Diagnosis not present

## 2017-12-06 DIAGNOSIS — F419 Anxiety disorder, unspecified: Secondary | ICD-10-CM | POA: Diagnosis present

## 2017-12-06 DIAGNOSIS — J9611 Chronic respiratory failure with hypoxia: Secondary | ICD-10-CM | POA: Diagnosis not present

## 2017-12-06 DIAGNOSIS — M545 Low back pain: Secondary | ICD-10-CM | POA: Diagnosis present

## 2017-12-08 ENCOUNTER — Telehealth: Payer: Self-pay

## 2017-12-08 NOTE — Telephone Encounter (Signed)
Left voicemail on patient's and daughter's phone regarding labs.

## 2017-12-10 DIAGNOSIS — Z09 Encounter for follow-up examination after completed treatment for conditions other than malignant neoplasm: Secondary | ICD-10-CM | POA: Diagnosis not present

## 2017-12-10 DIAGNOSIS — E538 Deficiency of other specified B group vitamins: Secondary | ICD-10-CM | POA: Diagnosis not present

## 2017-12-10 DIAGNOSIS — Z1339 Encounter for screening examination for other mental health and behavioral disorders: Secondary | ICD-10-CM | POA: Diagnosis not present

## 2017-12-10 DIAGNOSIS — Z23 Encounter for immunization: Secondary | ICD-10-CM | POA: Diagnosis not present

## 2017-12-10 DIAGNOSIS — J189 Pneumonia, unspecified organism: Secondary | ICD-10-CM | POA: Diagnosis not present

## 2017-12-16 DIAGNOSIS — J189 Pneumonia, unspecified organism: Secondary | ICD-10-CM | POA: Diagnosis not present

## 2017-12-18 DIAGNOSIS — G4733 Obstructive sleep apnea (adult) (pediatric): Secondary | ICD-10-CM | POA: Diagnosis not present

## 2017-12-22 DIAGNOSIS — E559 Vitamin D deficiency, unspecified: Secondary | ICD-10-CM | POA: Diagnosis not present

## 2017-12-22 DIAGNOSIS — R5383 Other fatigue: Secondary | ICD-10-CM | POA: Diagnosis not present

## 2017-12-22 DIAGNOSIS — J4520 Mild intermittent asthma, uncomplicated: Secondary | ICD-10-CM | POA: Diagnosis not present

## 2017-12-22 DIAGNOSIS — R05 Cough: Secondary | ICD-10-CM | POA: Diagnosis not present

## 2017-12-22 DIAGNOSIS — G4733 Obstructive sleep apnea (adult) (pediatric): Secondary | ICD-10-CM | POA: Diagnosis not present

## 2017-12-22 DIAGNOSIS — J301 Allergic rhinitis due to pollen: Secondary | ICD-10-CM | POA: Diagnosis not present

## 2018-01-06 ENCOUNTER — Ambulatory Visit (INDEPENDENT_AMBULATORY_CARE_PROVIDER_SITE_OTHER): Payer: Medicare Other | Admitting: Cardiology

## 2018-01-06 ENCOUNTER — Encounter: Payer: Self-pay | Admitting: Cardiology

## 2018-01-06 VITALS — BP 100/62 | HR 69 | Ht 65.0 in | Wt 205.6 lb

## 2018-01-06 DIAGNOSIS — I208 Other forms of angina pectoris: Secondary | ICD-10-CM

## 2018-01-06 DIAGNOSIS — I480 Paroxysmal atrial fibrillation: Secondary | ICD-10-CM | POA: Diagnosis not present

## 2018-01-06 DIAGNOSIS — I739 Peripheral vascular disease, unspecified: Secondary | ICD-10-CM

## 2018-01-06 DIAGNOSIS — R319 Hematuria, unspecified: Secondary | ICD-10-CM | POA: Diagnosis not present

## 2018-01-06 HISTORY — DX: Hematuria, unspecified: R31.9

## 2018-01-06 NOTE — Progress Notes (Signed)
Cardiology Office Note:    Date:  01/06/2018   ID:  Shelia Sullivan, DOB 1931-12-15, MRN [id number]  PCP:  Orlinda Blalock, NP  Cardiologist:  [name], MD    Referring MD: Orlinda Blalock, NP   Chief Complaint  Patient presents with  . 6 week follow up  I have blood in my urine again  History of Present Illness:    Shelia Sullivan is a 84 y.o. female with paroxysmal atrial fibrillation.  She is being anticoagulated because her chads 2 Vascor equals 4 she did have history of hematuria that was treated with antibiotic hematuria clear but now again he still having a problem denies have any chest pain tightness squeezing pressure burning chest manage activity daily living with some difficulties but still independent  Past Medical History:  Diagnosis Date  . Acute chest pain 06/08/2017   Last Assessment & Plan:  Consistent with angina pectoris brought on by atrial fibrillation with rapid ventricular response.  Nuclear scan intermediate risk for significant obstructive coronary artery disease.  Options discussed:  We will proceed with diagnostic cardiac catheterization but forego intervention on an ad Hoc basis as she does have history of a GI bleed has been told not to take aspiri  . Anemia   . Anxiety 06/08/2017  . Anxiety and depression   . Arthritis   . Asthma   . Asthma   . Atrophic vaginitis   . CAD (coronary artery disease)   . Cancer ([place])   . Chronic pain 06/08/2017  . Chronic respiratory failure ([place]) 06/08/2017  . CKD (chronic kidney disease)   . COPD (chronic obstructive pulmonary disease) ([place])   . Dyslipidemia 06/09/2017   Last Assessment & Plan:  Low HDL with LDL 76. Low threshold for continued statin therapy  . Esophageal reflux   . Fibrocystic breast changes of both breasts 06/27/2015  . History of GI bleed 06/08/2017   Last Assessment & Plan:  Stable at present with reasonable hemoglobin and hematocrit.  However, as noted above will defer consideration dual  antiplatelet therapy at the present time due to need for  direct oral anticoagulant therapy given history of the atrial fibrillation pending review of the results from the diagnostic catheterization  . History of MI (myocardial infarction) 06/08/2017  . Hypercholesteremia   . Hypertension   . Hypothyroid   . Irritable bladder   . Kidney stone   . Morbid obesity ([place]) 06/27/2015  . Myocardial infarction ([place])   . Neuropathy   . Nocturia   . Osteoarthritis   . Peripheral vascular disease ([place]) 06/08/2017  . Rapid atrial fibrillation ([place]) 06/08/2017   Last Assessment & Plan:  Back in sinus rhythm at present but chads Vasc score is at least 4 based on age, gender, history of hypertension and diabetes.  Recommend anticoagulant therapy with a direct oral anticoagulant.  . Scars   . Stable angina pectoris ([place]) 06/08/2017   Added automatically from request for surgery [phone]  . Swelling   . Type 2 diabetes mellitus with stage 3 chronic kidney disease ([place])   . Type 2 diabetes mellitus, without long-term current use of insulin ([place]) 06/08/2017   Last Assessment & Plan:  Target A1c less than 7.0  . Vitamin D deficiency     Past Surgical History:  Procedure Laterality Date  . APPENDECTOMY  1950  . ARTHROPLASTY Right 2002   Dr Reeves Forth  . Arthroscopy of shoulder    . BREAST SURGERY    . CARPAL  TUNNEL RELEASE Left 2007  . CATARACT EXTRACTION Bilateral   . CHOLECYSTECTOMY  1980  . HERNIA REPAIR  1985  . LITHOTRIPSY  02/21/2017  . PARTIAL HYSTERECTOMY  1950  . REPLACEMENT TOTAL KNEE Left 02/03/2012  . TONSILLECTOMY  1940    Current Medications: Current Meds  Medication Sig  . ADVAIR DISKUS 250-50 MCG/DOSE AEPB Inhale 1 puff into the lungs 2 (two) times daily at 10 AM and 5 PM.   . albuterol (PROVENTIL HFA;VENTOLIN HFA) 108 (90 Base) MCG/ACT inhaler Inhale 1 puff into the lungs every 6 (six) hours as needed for wheezing or shortness of breath.  Marland Kitchen apixaban (ELIQUIS) 5 MG TABS tablet Take 1  tablet (5 mg total) by mouth 2 (two) times daily.  . Ascorbic Acid (VITAMIN C) 100 MG tablet Take 100 mg by mouth daily.  . Calcium Carb-Cholecalciferol (CALCIUM 600+D3 PO) Take by mouth daily.  . carvedilol (COREG) 6.25 MG tablet Take 6.25 mg by mouth 2 (two) times daily.   . diclofenac sodium (VOLTAREN) 1 % GEL   . Dulaglutide (TRULICITY) 1.5 PT/4.6FK SOPN once a week.  . DULoxetine (CYMBALTA) 30 MG capsule Take 30 mg by mouth daily.  . Ergocalciferol (VITAMIN D2 PO) Take by mouth daily.  . fluticasone (FLONASE) 50 MCG/ACT nasal spray Place into the nose as needed.  . furosemide (LASIX) 20 MG tablet Take 20 mg by mouth daily.   Marland Kitchen gabapentin (NEURONTIN) 600 MG tablet Take 600 mg by mouth every 8 (eight) hours as needed.  Marland Kitchen LEVEMIR FLEXTOUCH 100 UNIT/ML Pen Inject 50 Units into the skin daily.   Marland Kitchen levothyroxine (SYNTHROID) 50 MCG tablet Take 50 mcg by mouth daily.  Marland Kitchen lisinopril (PRINIVIL,ZESTRIL) 10 MG tablet Take 10 mg by mouth daily.  . Melatonin 5 MG TABS Take 10 mg by mouth at bedtime.  . metFORMIN (GLUCOPHAGE) 500 MG tablet 500 mg daily.  . MORPHINE SULFATE PO Take by mouth every 6 (six) hours as needed.  Marland Kitchen omeprazole (PRILOSEC) 20 MG capsule Take 20 mg by mouth daily.   . pravastatin (PRAVACHOL) 80 MG tablet Take 80 mg by mouth at bedtime.   Marland Kitchen QUEtiapine (SEROQUEL) 200 MG tablet Take 200 mg by mouth daily.   . SURE COMFORT PEN NEEDLES 32G X 6 MM MISC   . trimethoprim (TRIMPEX) 100 MG tablet Take by mouth daily.     Allergies:   Codeine; Nsaids; and Tolmetin   Social History   Socioeconomic History  . Marital status: Widowed    Spouse name: Not on file  . Number of children: 4  . Years of education: Not on file  . Highest education level: Not on file  Occupational History  . Not on file  Social Needs  . Financial resource strain: Not on file  . Food insecurity:    Worry: Not on file    Inability: Not on file  . Transportation needs:    Medical: Not on file     Non-medical: Not on file  Tobacco Use  . Smoking status: Former Smoker    Packs/day: 1.00    Years: 50.00    Pack years: 50.00    Last attempt to quit: 03/1998    Years since quitting: 19.7  . Smokeless tobacco: Never Used  Substance and Sexual Activity  . Alcohol use: No  . Drug use: No  . Sexual activity: Not on file  Lifestyle  . Physical activity:    Days per week: Not on file    Minutes  per session: Not on file  . Stress: Not on file  Relationships  . Social connections:    Talks on phone: Not on file    Gets together: Not on file    Attends religious service: Not on file    Active member of club or organization: Not on file    Attends meetings of clubs or organizations: Not on file    Relationship status: Not on file  Other Topics Concern  . Not on file  Social History Narrative   Daughter and grandson live with patient.     Family History: The patient's family history includes Arthritis in her father; Breast cancer in her daughter; Colon cancer in her father; Diabetes in her sister; Heart disease in her father; Hyperlipidemia in her mother; Hypertension in her father and mother; Prostate cancer in her father; Stroke in her father. ROS:   Please see the history of present illness.    All 14 point review of systems negative except as described per history of present illness  EKGs/Labs/Other Studies Reviewed:      Recent Labs: 09/30/2017: BUN 22; Creatinine, Ser 0.99; Potassium 4.5; Sodium 143 12/04/2017: Hemoglobin 13.3; Platelets 229  Recent Lipid Panel    Component Value Date/Time   CHOL 151 09/02/2013 1300   TRIG 171 (H) 09/02/2013 1300   HDL 25 (L) 09/02/2013 1300   CHOLHDL 6.0 09/02/2013 1300   VLDL 34 09/02/2013 1300   LDLCALC 92 09/02/2013 1300    Physical Exam:    VS:  BP 100/62   Pulse 69   Ht 5\' 5"  (1.651 m)   Wt 205 lb 9.6 oz (93.3 kg)   SpO2 92% Comment: 2LT of O2  BMI 34.21 kg/m     Wt Readings from Last 3 Encounters:  01/06/18 205 lb  9.6 oz (93.3 kg)  09/30/17 219 lb (99.3 kg)  09/01/17 223 lb (101.2 kg)     GEN:  Well nourished, well developed in no acute distress HEENT: Normal NECK: No JVD; No carotid bruits LYMPHATICS: No lymphadenopathy CARDIAC: RRR, no murmurs, no rubs, no gallops RESPIRATORY:  Clear to auscultation without rales, wheezing or rhonchi  ABDOMEN: Soft, non-tender, non-distended MUSCULOSKELETAL:  No edema; No deformity  SKIN: Warm and dry LOWER EXTREMITIES: no swelling NEUROLOGIC:  Alert and oriented x 3 PSYCHIATRIC:  Normal affect   ASSESSMENT:    1. Paroxysmal atrial fibrillation (HCC)   2. Stable angina pectoris ([place])   3. Peripheral vascular disease ([place])   4. Hematuria, unspecified type    PLAN:    In order of problems listed above:  1. Paroxysmal atrial fibrillation denies having any recent palpitations.  Anticoagulated because of high chads 2 Vascor which I will continue obviously concern is the fact that she has hematuria we will check her CBC today she will be referred back to Dr. Gerlene Burdock 2. Stable angina pectoris: Denies having any. 3. Peripheral vascular disease asymptomatic. 4. Hematuria plan as outlined above   Medication Adjustments/Labs and Tests Ordered: Current medicines are reviewed at length with the patient today.  Concerns regarding medicines are outlined above.  No orders of the defined types were placed in this encounter.  Medication changes: No orders of the defined types were placed in this encounter.   Signed, Park Liter, MD, [facility] 01/06/2018 3:20 PM    [facility]

## 2018-01-06 NOTE — Patient Instructions (Signed)
Medication Instructions:  Your physician recommends that you continue on your current medications as directed. Please refer to the Current Medication list given to you today.  If you need a refill on your cardiac medications before your next appointment, please call your pharmacy.   Lab work: Your physician recommends that you return for lab work today: BMP, CBC   If you have labs (blood work) drawn today and your tests are completely normal, you will receive your results only by: Marland Kitchen MyChart Message (if you have MyChart) OR . A paper copy in the mail If you have any lab test that is abnormal or we need to change your treatment, we will call you to review the results.  Testing/Procedures: None.   Follow-Up: At [facility], you and your health needs are our priority.  As part of our continuing mission to provide you with exceptional heart care, we have created designated Provider Care Teams.  These Care Teams include your primary Cardiologist (physician) and Advanced Practice Providers (APPs -  Physician Assistants and Nurse Practitioners) who all work together to provide you with the care you need, when you need it. You will need a follow up appointment in 2 months.  Please call our office 2 months in advance to schedule this appointment.  You may see No primary care provider on file. or another member of our [employer] Provider Team in [place]: Shirlee More, MD . Jyl Heinz, MD  Any Other Special Instructions Will Be Listed Below (If Applicable).

## 2018-01-07 LAB — CBC
HEMATOCRIT: 39.4 % (ref 34.0–46.6)
Hemoglobin: 13.4 g/dL (ref 11.1–15.9)
MCH: 31.2 pg (ref 26.6–33.0)
MCHC: 34 g/dL (ref 31.5–35.7)
MCV: 92 fL (ref 79–97)
Platelets: 304 10*3/uL (ref 150–450)
RBC: 4.3 x10E6/uL (ref 3.77–5.28)
RDW: 13.9 % (ref 12.3–15.4)
WBC: 10 10*3/uL (ref 3.4–10.8)

## 2018-01-07 LAB — BASIC METABOLIC PANEL
BUN / CREAT RATIO: 16 (ref 12–28)
BUN: 16 mg/dL (ref 8–27)
CO2: 28 mmol/L (ref 20–29)
Calcium: 10.7 mg/dL — ABNORMAL HIGH (ref 8.7–10.3)
Chloride: 103 mmol/L (ref 96–106)
Creatinine, Ser: 1.03 mg/dL — ABNORMAL HIGH (ref 0.57–1.00)
GFR, EST AFRICAN AMERICAN: 58 mL/min/{1.73_m2} — AB (ref >59)
GFR, EST NON AFRICAN AMERICAN: 50 mL/min/{1.73_m2} — AB (ref >59)
GLUCOSE: 59 mg/dL — AB (ref 65–99)
Potassium: 4.1 mmol/L (ref 3.5–5.2)
SODIUM: 145 mmol/L — AB (ref 134–144)

## 2018-02-04 ENCOUNTER — Ambulatory Visit (INDEPENDENT_AMBULATORY_CARE_PROVIDER_SITE_OTHER): Payer: Medicare Other | Admitting: Sports Medicine

## 2018-02-04 ENCOUNTER — Encounter: Payer: Self-pay | Admitting: Sports Medicine

## 2018-02-04 VITALS — BP 117/66 | HR 72

## 2018-02-04 DIAGNOSIS — M2040 Other hammer toe(s) (acquired), unspecified foot: Secondary | ICD-10-CM

## 2018-02-04 DIAGNOSIS — B351 Tinea unguium: Secondary | ICD-10-CM | POA: Diagnosis not present

## 2018-02-04 DIAGNOSIS — I739 Peripheral vascular disease, unspecified: Secondary | ICD-10-CM

## 2018-02-04 DIAGNOSIS — E1140 Type 2 diabetes mellitus with diabetic neuropathy, unspecified: Secondary | ICD-10-CM

## 2018-02-04 DIAGNOSIS — L84 Corns and callosities: Secondary | ICD-10-CM

## 2018-02-04 DIAGNOSIS — M79675 Pain in left toe(s): Secondary | ICD-10-CM | POA: Diagnosis not present

## 2018-02-04 DIAGNOSIS — M79674 Pain in right toe(s): Secondary | ICD-10-CM

## 2018-02-04 NOTE — Progress Notes (Signed)
Subjective: Shelia Sullivan is a 84 y.o. female patient with history of diabetes who presents to office today complaining of long, painful nails and callus while ambulating in shoes; unable to trim. Patient states that the glucose reading was 250 several day ago, Last saw Dr Shelia Sullivan 4 weeks ago and last A1c 6.9.  Patient denies any other new issues at this time.  Patient Active Problem List   Diagnosis Date Noted  . Hematuria 01/06/2018  . Paroxysmal atrial fibrillation ([place]) 09/30/2017  . Dyslipidemia 06/09/2017  . Type 2 diabetes mellitus, without long-term current use of insulin ([place]) 06/08/2017  . Stable angina pectoris ([place]) 06/08/2017  . Rapid atrial fibrillation ([place]) 06/08/2017  . Peripheral vascular disease ([place]) 06/08/2017  . History of GI bleed 06/08/2017  . History of MI (myocardial infarction) 06/08/2017  . Kidney stone 06/08/2017  . Chronic respiratory failure ([place]) 06/08/2017  . Chronic pain 06/08/2017  . Asthma 06/08/2017  . Anxiety 06/08/2017  . Acute chest pain 06/08/2017  . Morbid obesity ([place]) 06/27/2015  . Fibrocystic breast changes of both breasts 06/27/2015   Current Outpatient Medications on File Prior to Visit  Medication Sig Dispense Refill  . ADVAIR DISKUS 250-50 MCG/DOSE AEPB Inhale 1 puff into the lungs 2 (two) times daily at 10 AM and 5 PM.     . albuterol (PROVENTIL HFA;VENTOLIN HFA) 108 (90 Base) MCG/ACT inhaler Inhale 1 puff into the lungs every 6 (six) hours as needed for wheezing or shortness of breath.    Marland Kitchen apixaban (ELIQUIS) 5 MG TABS tablet Take 1 tablet (5 mg total) by mouth 2 (two) times daily. 180 tablet 2  . Ascorbic Acid (VITAMIN C) 100 MG tablet Take 100 mg by mouth daily.    . Baclofen 5 MG TABS TAKE 1 2 TO 1 (ONE HALF TO ONE) TABLET BY MOUTH AT BEDTIME  0  . Calcium Carb-Cholecalciferol (CALCIUM 600+D3 PO) Take by mouth daily.    . carvedilol (COREG) 6.25 MG tablet Take 6.25 mg by mouth 2 (two) times daily.     . diclofenac sodium  (VOLTAREN) 1 % GEL     . Dulaglutide (TRULICITY) 1.5 WC/5.8NI SOPN once a week.    . DULoxetine (CYMBALTA) 30 MG capsule Take 30 mg by mouth daily.    . Ergocalciferol (VITAMIN D2 PO) Take by mouth daily.    . fluticasone (FLONASE) 50 MCG/ACT nasal spray Place into the nose as needed.    . furosemide (LASIX) 20 MG tablet Take 20 mg by mouth daily.     Marland Kitchen gabapentin (NEURONTIN) 600 MG tablet Take 600 mg by mouth every 8 (eight) hours as needed.    Marland Kitchen HYDROmorphone (DILAUDID) 4 MG tablet TAKE 1 TABLET BY MOUTH EVERY 6 TO 8 HOURS AS NEEDED FOR CHRONIC PAIN (MAX 4 PER DAY)  0  . LEVEMIR FLEXTOUCH 100 UNIT/ML Pen Inject 50 Units into the skin daily.     Marland Kitchen levothyroxine (SYNTHROID) 50 MCG tablet Take 50 mcg by mouth daily.    Marland Kitchen lisinopril (PRINIVIL,ZESTRIL) 10 MG tablet Take 10 mg by mouth daily.    . Melatonin 5 MG TABS Take 10 mg by mouth at bedtime.    . metFORMIN (GLUCOPHAGE) 500 MG tablet 500 mg daily.    . MORPHINE SULFATE PO Take by mouth every 6 (six) hours as needed.    Marland Kitchen omeprazole (PRILOSEC) 20 MG capsule Take 20 mg by mouth daily.     . pravastatin (PRAVACHOL) 80 MG tablet Take 80 mg by  mouth at bedtime.     Marland Kitchen QUEtiapine (SEROQUEL) 200 MG tablet Take 200 mg by mouth daily.     . SURE COMFORT PEN NEEDLES 32G X 6 MM MISC     . trimethoprim (TRIMPEX) 100 MG tablet Take by mouth daily.     No current facility-administered medications on file prior to visit.    Allergies  Allergen Reactions  . Codeine   . Nsaids Other (See Comments)    GI Bleeding  . Tolmetin Other (See Comments)    GI Bleeding    Recent Results (from the past 2160 hour(s))  CBC     Status: Abnormal   Collection Time: 12/04/17  3:21 PM  Result Value Ref Range   WBC 13.3 (H) 3.4 - 10.8 x10E3/uL   RBC 4.39 3.77 - 5.28 x10E6/uL   Hemoglobin 13.3 11.1 - 15.9 g/dL   Hematocrit 40.6 34.0 - 46.6 %   MCV 93 79 - 97 fL   MCH 30.3 26.6 - 33.0 pg   MCHC 32.8 31.5 - 35.7 g/dL   RDW 13.3 12.3 - 15.4 %   Platelets 229 150  - 450 x10E3/uL  CBC     Status: None   Collection Time: 01/06/18  3:35 PM  Result Value Ref Range   WBC 10.0 3.4 - 10.8 x10E3/uL   RBC 4.30 3.77 - 5.28 x10E6/uL   Hemoglobin 13.4 11.1 - 15.9 g/dL   Hematocrit 39.4 34.0 - 46.6 %   MCV 92 79 - 97 fL   MCH 31.2 26.6 - 33.0 pg   MCHC 34.0 31.5 - 35.7 g/dL   RDW 13.9 12.3 - 15.4 %   Platelets 304 150 - 450 [id number]  Basic metabolic panel     Status: Abnormal   Collection Time: 01/06/18  3:35 PM  Result Value Ref Range   Glucose 59 (L) 65 - 99 mg/dL   BUN 16 8 - 27 mg/dL   Creatinine, Ser 1.03 (H) 0.57 - 1.00 mg/dL   GFR calc non Af Amer 50 (L) >59 mL/min/1.73   GFR calc Af Amer 58 (L) >59 mL/min/1.73   BUN/Creatinine Ratio 16 12 - 28   Sodium 145 (H) 134 - 144 mmol/L   Potassium 4.1 3.5 - 5.2 mmol/L   Chloride 103 96 - 106 mmol/L   CO2 28 20 - 29 mmol/L   Calcium 10.7 (H) 8.7 - 10.3 mg/dL    Objective: General: Patient is awake, alert, and oriented x 3 and in no acute distress. On Oxygen.  Walker assisted gait.   Integument: Skin is dry and supple bilateral. Nails are tender, long, thickened and dystrophic with subungual debris, consistent with onychomycosis, 2-5 bilateral.  There is no hallux nails bilateral from previous removal. No signs of infection. + callus at tips of 3rd toes bilateral. Remaining integument unremarkable.  Vasculature:  Dorsalis Pedis pulse 1/4 bilateral. Posterior Tibial pulse  0/4 bilateral. Capillary fill time <5 sec 1-5 bilateral. Scant hair growth to the level of the digits.Temperature gradient mildly decreased bilateral. + varicosities present bilateral. Trace edema present bilateral, L>R chronic.   Neurology: The patient has absent sensation measured with a 5.07/10g Semmes Weinstein Monofilament at all pedal sites bilateral . Vibratory sensation diminished bilateral with tuning fork. No Babinski sign present bilateral.   Musculoskeletal: Asymptomatic hammertoe and fat pad atrophy with planus pedal  deformities noted bilateral. Muscular strength 5/5 in all lower extremity muscular groups bilateral without pain on range of motion however stiffness likely arthritis present. No  tenderness with calf compression bilateral.  Assessment and Plan: Problem List Items Addressed This Visit    None    Visit Diagnoses    Pain due to onychomycosis of toenails of both feet    -  Primary   Callus of foot       Hammer toe, unspecified laterality       Type 2 diabetes, controlled, with neuropathy (HCC)       PVD (peripheral vascular disease) ([place])         -Examined patient. -Discussed and educated patient on diabetic foot care, especially with  regards to the vascular, neurological and musculoskeletal systems.  -Mechanically debrided callus x 2 using rotary bur at no charge without incident -Mechanically debrided  all nails 2-5 bilateral using sterile nail nipper and filed with dremel without incident  -Patient to return  in 3 months for at risk foot care -Patient advised to call the office if any problems or questions arise in the meantime.  Landis Martins, DPM

## 2018-02-10 DIAGNOSIS — E7800 Pure hypercholesterolemia, unspecified: Secondary | ICD-10-CM | POA: Diagnosis not present

## 2018-02-10 DIAGNOSIS — E559 Vitamin D deficiency, unspecified: Secondary | ICD-10-CM | POA: Diagnosis not present

## 2018-02-10 DIAGNOSIS — E538 Deficiency of other specified B group vitamins: Secondary | ICD-10-CM | POA: Diagnosis not present

## 2018-02-10 DIAGNOSIS — Z79899 Other long term (current) drug therapy: Secondary | ICD-10-CM | POA: Diagnosis not present

## 2018-02-10 DIAGNOSIS — E1122 Type 2 diabetes mellitus with diabetic chronic kidney disease: Secondary | ICD-10-CM | POA: Diagnosis not present

## 2018-02-10 DIAGNOSIS — E039 Hypothyroidism, unspecified: Secondary | ICD-10-CM | POA: Diagnosis not present

## 2018-02-10 DIAGNOSIS — D649 Anemia, unspecified: Secondary | ICD-10-CM | POA: Diagnosis not present

## 2018-02-23 DIAGNOSIS — R310 Gross hematuria: Secondary | ICD-10-CM | POA: Diagnosis not present

## 2018-02-23 DIAGNOSIS — N390 Urinary tract infection, site not specified: Secondary | ICD-10-CM | POA: Diagnosis not present

## 2018-02-27 DIAGNOSIS — M1611 Unilateral primary osteoarthritis, right hip: Secondary | ICD-10-CM | POA: Diagnosis not present

## 2018-03-06 ENCOUNTER — Ambulatory Visit: Payer: Self-pay | Admitting: Cardiology

## 2018-03-10 DIAGNOSIS — M1611 Unilateral primary osteoarthritis, right hip: Secondary | ICD-10-CM | POA: Diagnosis not present

## 2018-03-16 DIAGNOSIS — E559 Vitamin D deficiency, unspecified: Secondary | ICD-10-CM | POA: Diagnosis not present

## 2018-03-16 DIAGNOSIS — J4520 Mild intermittent asthma, uncomplicated: Secondary | ICD-10-CM | POA: Diagnosis not present

## 2018-03-16 DIAGNOSIS — J301 Allergic rhinitis due to pollen: Secondary | ICD-10-CM | POA: Diagnosis not present

## 2018-03-16 DIAGNOSIS — G4733 Obstructive sleep apnea (adult) (pediatric): Secondary | ICD-10-CM | POA: Diagnosis not present

## 2018-03-16 DIAGNOSIS — R5383 Other fatigue: Secondary | ICD-10-CM | POA: Diagnosis not present

## 2018-03-31 DIAGNOSIS — Z961 Presence of intraocular lens: Secondary | ICD-10-CM | POA: Diagnosis not present

## 2018-03-31 DIAGNOSIS — E119 Type 2 diabetes mellitus without complications: Secondary | ICD-10-CM | POA: Diagnosis not present

## 2018-03-31 DIAGNOSIS — H353131 Nonexudative age-related macular degeneration, bilateral, early dry stage: Secondary | ICD-10-CM | POA: Diagnosis not present

## 2018-04-08 ENCOUNTER — Ambulatory Visit (INDEPENDENT_AMBULATORY_CARE_PROVIDER_SITE_OTHER): Payer: Medicare Other | Admitting: Cardiology

## 2018-04-08 ENCOUNTER — Encounter: Payer: Self-pay | Admitting: Cardiology

## 2018-04-08 VITALS — BP 120/60 | HR 75 | Ht 65.0 in | Wt 195.4 lb

## 2018-04-08 DIAGNOSIS — I480 Paroxysmal atrial fibrillation: Secondary | ICD-10-CM

## 2018-04-08 DIAGNOSIS — E785 Hyperlipidemia, unspecified: Secondary | ICD-10-CM | POA: Diagnosis not present

## 2018-04-08 DIAGNOSIS — R319 Hematuria, unspecified: Secondary | ICD-10-CM

## 2018-04-08 DIAGNOSIS — I208 Other forms of angina pectoris: Secondary | ICD-10-CM

## 2018-04-08 NOTE — Patient Instructions (Signed)
Medication Instructions:  Your physician recommends that you continue on your current medications as directed. Please refer to the Current Medication list given to you today.  If you need a refill on your cardiac medications before your next appointment, please call your pharmacy.   Lab work: None.  If you have labs (blood work) drawn today and your tests are completely normal, you will receive your results only by: Marland Kitchen MyChart Message (if you have MyChart) OR . A paper copy in the mail If you have any lab test that is abnormal or we need to change your treatment, we will call you to review the results.  Testing/Procedures: Your physician has requested that you have an echocardiogram. Echocardiography is a painless test that uses sound waves to create images of your heart. It provides your doctor with information about the size and shape of your heart and how well your heart's chambers and valves are working. This procedure takes approximately one hour. There are no restrictions for this procedure.    Follow-Up: At [facility], you and your health needs are our priority.  As part of our continuing mission to provide you with exceptional heart care, we have created designated Provider Care Teams.  These Care Teams include your primary Cardiologist (physician) and Advanced Practice Providers (APPs -  Physician Assistants and Nurse Practitioners) who all work together to provide you with the care you need, when you need it. You will need a follow up appointment in 5 months.  Please call our office 2 months in advance to schedule this appointment.  You may see [name], MD or another member of our [facility] Provider Team in [place]: Shirlee More, MD . Jyl Heinz, MD  Any Other Special Instructions Will Be Listed Below (If Applicable).  Echocardiogram An echocardiogram is a procedure that uses painless sound waves (ultrasound) to produce an image of the heart. Images from an  echocardiogram can provide important information about:  Signs of coronary artery disease (CAD).  Aneurysm detection. An aneurysm is a weak or damaged part of an artery wall that bulges out from the normal force of blood pumping through the body.  Heart size and shape. Changes in the size or shape of the heart can be associated with certain conditions, including heart failure, aneurysm, and CAD.  Heart muscle function.  Heart valve function.  Signs of a past heart attack.  Fluid buildup around the heart.  Thickening of the heart muscle.  A tumor or infectious growth around the heart valves. Tell a health care provider about:  Any allergies you have.  All medicines you are taking, including vitamins, herbs, eye drops, creams, and over-the-counter medicines.  Any blood disorders you have.  Any surgeries you have had.  Any medical conditions you have.  Whether you are pregnant or may be pregnant. What are the risks? Generally, this is a safe procedure. However, problems may occur, including:  Allergic reaction to dye (contrast) that may be used during the procedure. What happens before the procedure? No specific preparation is needed. You may eat and drink normally. What happens during the procedure?   An IV tube may be inserted into one of your veins.  You may receive contrast through this tube. A contrast is an injection that improves the quality of the pictures from your heart.  A gel will be applied to your chest.  A wand-like tool (transducer) will be moved over your chest. The gel will help to transmit the sound waves  from the transducer.  The sound waves will harmlessly bounce off of your heart to allow the heart images to be captured in real-time motion. The images will be recorded on a computer. The procedure may vary among health care providers and hospitals. What happens after the procedure?  You may return to your normal, everyday life, including diet,  activities, and medicines, unless your health care provider tells you not to do that. Summary  An echocardiogram is a procedure that uses painless sound waves (ultrasound) to produce an image of the heart.  Images from an echocardiogram can provide important information about the size and shape of your heart, heart muscle function, heart valve function, and fluid buildup around your heart.  You do not need to do anything to prepare before this procedure. You may eat and drink normally.  After the echocardiogram is completed, you may return to your normal, everyday life, unless your health care provider tells you not to do that. This information is not intended to replace advice given to you by your health care provider. Make sure you discuss any questions you have with your health care provider. Document Released: 02/09/2000 Document Revised: 03/16/2016 Document Reviewed: 03/16/2016 Elsevier Interactive Patient Education  2019 [employer].

## 2018-04-08 NOTE — Progress Notes (Signed)
Cardiology Office Note:    Date:  04/08/2018   ID:  ARENA LINDAHL, DOB 1931-04-11, MRN [id number]  PCP:  Orlinda Blalock, NP  Cardiologist:  [name], MD    Referring MD: Orlinda Blalock, NP   Chief Complaint  Patient presents with  . 2 month follow up  Doing well  History of Present Illness:    Shelia Sullivan is a 85 y.o. female with paroxysmal atrial fibrillation.  Also history of hematuria no clear-cut reason for it so far.  Still anticoagulated which I will continue denies have any chest pain tightness squeezing pressure been chest biggest problem is pain in her joints therefore she cannot do much but still says she is able to mop the floor with no difficulties gets no shortness of breath while doing it.  Past Medical History:  Diagnosis Date  . Acute chest pain 06/08/2017   Last Assessment & Plan:  Consistent with angina pectoris brought on by atrial fibrillation with rapid ventricular response.  Nuclear scan intermediate risk for significant obstructive coronary artery disease.  Options discussed:  We will proceed with diagnostic cardiac catheterization but forego intervention on an ad Hoc basis as she does have history of a GI bleed has been told not to take aspiri  . Anemia   . Anxiety 06/08/2017  . Anxiety and depression   . Arthritis   . Asthma   . Asthma   . Atrophic vaginitis   . CAD (coronary artery disease)   . Cancer ([place])   . Chronic pain 06/08/2017  . Chronic respiratory failure ([place]) 06/08/2017  . CKD (chronic kidney disease)   . COPD (chronic obstructive pulmonary disease) ([place])   . Dyslipidemia 06/09/2017   Last Assessment & Plan:  Low HDL with LDL 76. Low threshold for continued statin therapy  . Esophageal reflux   . Fibrocystic breast changes of both breasts 06/27/2015  . History of GI bleed 06/08/2017   Last Assessment & Plan:  Stable at present with reasonable hemoglobin and hematocrit.  However, as noted above will defer consideration dual  antiplatelet therapy at the present time due to need for  direct oral anticoagulant therapy given history of the atrial fibrillation pending review of the results from the diagnostic catheterization  . History of MI (myocardial infarction) 06/08/2017  . Hypercholesteremia   . Hypertension   . Hypothyroid   . Irritable bladder   . Kidney stone   . Morbid obesity ([name]) 06/27/2015  . Myocardial infarction ([name])   . Neuropathy   . Nocturia   . Osteoarthritis   . Peripheral vascular disease ([place]) 06/08/2017  . Rapid atrial fibrillation ([place]) 06/08/2017   Last Assessment & Plan:  Back in sinus rhythm at present but chads Vasc score is at least 4 based on age, gender, history of hypertension and diabetes.  Recommend anticoagulant therapy with a direct oral anticoagulant.  . Scars   . Stable angina pectoris ([place]) 06/08/2017   Added automatically from request for surgery [phone]  . Swelling   . Type 2 diabetes mellitus with stage 3 chronic kidney disease ([place])   . Type 2 diabetes mellitus, without long-term current use of insulin ([place]) 06/08/2017   Last Assessment & Plan:  Target A1c less than 7.0  . Vitamin D deficiency     Past Surgical History:  Procedure Laterality Date  . APPENDECTOMY  1950  . ARTHROPLASTY Right 2002   Dr Reeves Forth  . Arthroscopy of shoulder    . BREAST SURGERY    .  CARPAL TUNNEL RELEASE Left 2007  . CATARACT EXTRACTION Bilateral   . CHOLECYSTECTOMY  1980  . HERNIA REPAIR  1985  . LITHOTRIPSY  02/21/2017  . PARTIAL HYSTERECTOMY  1950  . REPLACEMENT TOTAL KNEE Left 02/03/2012  . TONSILLECTOMY  1940    Current Medications: Current Meds  Medication Sig  . ADVAIR DISKUS 250-50 MCG/DOSE AEPB Inhale 1 puff into the lungs 2 (two) times daily at 10 AM and 5 PM.   . albuterol (PROVENTIL HFA;VENTOLIN HFA) 108 (90 Base) MCG/ACT inhaler Inhale 1 puff into the lungs every 6 (six) hours as needed for wheezing or shortness of breath.  Marland Kitchen apixaban (ELIQUIS) 5 MG TABS tablet Take 1  tablet (5 mg total) by mouth 2 (two) times daily.  . Ascorbic Acid (VITAMIN C) 100 MG tablet Take 100 mg by mouth daily.  . Baclofen 5 MG TABS TAKE 1 2 TO 1 (ONE HALF TO ONE) TABLET BY MOUTH AT BEDTIME  . Calcium Carb-Cholecalciferol (CALCIUM 600+D3 PO) Take by mouth daily.  . carvedilol (COREG) 6.25 MG tablet Take 6.25 mg by mouth 2 (two) times daily.   . diclofenac sodium (VOLTAREN) 1 % GEL   . Dulaglutide (TRULICITY) 1.5 QI/2.9NL SOPN once a week.  . DULoxetine (CYMBALTA) 30 MG capsule Take 30 mg by mouth daily.  . Ergocalciferol (VITAMIN D2 PO) Take by mouth daily.  . fluticasone (FLONASE) 50 MCG/ACT nasal spray Place into the nose as needed.  . furosemide (LASIX) 20 MG tablet Take 20 mg by mouth daily.   Marland Kitchen gabapentin (NEURONTIN) 600 MG tablet Take 600 mg by mouth every 8 (eight) hours as needed.  Marland Kitchen HYDROmorphone (DILAUDID) 4 MG tablet TAKE 1 TABLET BY MOUTH EVERY 6 TO 8 HOURS AS NEEDED FOR CHRONIC PAIN (MAX 4 PER DAY)  . LEVEMIR FLEXTOUCH 100 UNIT/ML Pen Inject 50 Units into the skin daily.   Marland Kitchen levothyroxine (SYNTHROID) 50 MCG tablet Take 50 mcg by mouth daily.  Marland Kitchen lisinopril (PRINIVIL,ZESTRIL) 10 MG tablet Take 10 mg by mouth daily.  . Melatonin 5 MG TABS Take 10 mg by mouth at bedtime.  . metFORMIN (GLUCOPHAGE) 500 MG tablet 500 mg daily.  . MORPHINE SULFATE PO Take by mouth every 6 (six) hours as needed.  Marland Kitchen omeprazole (PRILOSEC) 20 MG capsule Take 20 mg by mouth daily.   . pravastatin (PRAVACHOL) 80 MG tablet Take 80 mg by mouth at bedtime.   Marland Kitchen QUEtiapine (SEROQUEL) 200 MG tablet Take 200 mg by mouth daily.   . SURE COMFORT PEN NEEDLES 32G X 6 MM MISC   . trimethoprim (TRIMPEX) 100 MG tablet Take by mouth daily.     Allergies:   Codeine; Nsaids; and Tolmetin   Social History   Socioeconomic History  . Marital status: Widowed    Spouse name: Not on file  . Number of children: 4  . Years of education: Not on file  . Highest education level: Not on file  Occupational History    . Not on file  Social Needs  . Financial resource strain: Not on file  . Food insecurity:    Worry: Not on file    Inability: Not on file  . Transportation needs:    Medical: Not on file    Non-medical: Not on file  Tobacco Use  . Smoking status: Former Smoker    Packs/day: 1.00    Years: 50.00    Pack years: 50.00    Last attempt to quit: 03/1998    Years since quitting:  20.0  . Smokeless tobacco: Never Used  Substance and Sexual Activity  . Alcohol use: No  . Drug use: No  . Sexual activity: Not on file  Lifestyle  . Physical activity:    Days per week: Not on file    Minutes per session: Not on file  . Stress: Not on file  Relationships  . Social connections:    Talks on phone: Not on file    Gets together: Not on file    Attends religious service: Not on file    Active member of club or organization: Not on file    Attends meetings of clubs or organizations: Not on file    Relationship status: Not on file  Other Topics Concern  . Not on file  Social History Narrative   Daughter and grandson live with patient.     Family History: The patient's family history includes Arthritis in her father; Breast cancer in her daughter; Colon cancer in her father; Diabetes in her sister; Heart disease in her father; Hyperlipidemia in her mother; Hypertension in her father and mother; Prostate cancer in her father; Stroke in her father. ROS:   Please see the history of present illness.    All 14 point review of systems negative except as described per history of present illness  EKGs/Labs/Other Studies Reviewed:      Recent Labs: 01/06/2018: BUN 16; Creatinine, Ser 1.03; Hemoglobin 13.4; Platelets 304; Potassium 4.1; Sodium 145  Recent Lipid Panel    Component Value Date/Time   CHOL 151 09/02/2013 1300   TRIG 171 (H) 09/02/2013 1300   HDL 25 (L) 09/02/2013 1300   CHOLHDL 6.0 09/02/2013 1300   VLDL 34 09/02/2013 1300   LDLCALC 92 09/02/2013 1300    Physical Exam:     VS:  BP 120/60   Pulse 75   Ht 5\' 5"  (1.651 m)   Wt 195 lb 6.4 oz (88.6 kg)   SpO2 96%   BMI 32.52 kg/m     Wt Readings from Last 3 Encounters:  04/08/18 195 lb 6.4 oz (88.6 kg)  01/06/18 205 lb 9.6 oz (93.3 kg)  09/30/17 219 lb (99.3 kg)     GEN:  Well nourished, well developed in no acute distress HEENT: Normal NECK: No JVD; No carotid bruits LYMPHATICS: No lymphadenopathy CARDIAC: RRR, no murmurs, no rubs, no gallops RESPIRATORY:  Clear to auscultation without rales, wheezing or rhonchi  ABDOMEN: Soft, non-tender, non-distended MUSCULOSKELETAL:  No edema; No deformity  SKIN: Warm and dry LOWER EXTREMITIES: no swelling NEUROLOGIC:  Alert and oriented x 3 PSYCHIATRIC:  Normal affect   ASSESSMENT:    1. Paroxysmal atrial fibrillation (HCC)   2. Stable angina pectoris ([place])   3. Hematuria, unspecified type   4. Dyslipidemia    PLAN:    In order of problems listed above:  1. Paroxysmal atrial fibrillation seems to maintaining sinus rhythm on Eliquis which I will continue.  I will check her CBC today and Chem-7 to make sure the dose of medication is adequate. 2. Stable angina pectoris denies having any recent chest pain. 3. Hematuria we will check a CBC today. 4. Dyslipidemia on Pravachol which I will continue.  Echocardiogram will be done to assess left ventricular ejection fraction left atrial size.  EKG will be done today as well   Medication Adjustments/Labs and Tests Ordered: Current medicines are reviewed at length with the patient today.  Concerns regarding medicines are outlined above.  No orders of the defined types  were placed in this encounter.  Medication changes: No orders of the defined types were placed in this encounter.   Signed, Park Liter, MD, [facility] 04/08/2018 1:48 PM    [place]

## 2018-04-09 LAB — BASIC METABOLIC PANEL
BUN/Creatinine Ratio: 26 (ref 12–28)
BUN: 18 mg/dL (ref 8–27)
CO2: 30 mmol/L — ABNORMAL HIGH (ref 20–29)
Calcium: 11.3 mg/dL — ABNORMAL HIGH (ref 8.7–10.3)
Chloride: 104 mmol/L (ref 96–106)
Creatinine, Ser: 0.69 mg/dL (ref 0.57–1.00)
GFR calc Af Amer: 92 mL/min/{1.73_m2} (ref >59)
GFR calc non Af Amer: 80 mL/min/{1.73_m2} (ref >59)
GLUCOSE: 72 mg/dL (ref 65–99)
Potassium: 4.8 mmol/L (ref 3.5–5.2)
Sodium: 146 mmol/L — ABNORMAL HIGH (ref 134–144)

## 2018-04-09 LAB — CBC
Hematocrit: 36.7 % (ref 34.0–46.6)
Hemoglobin: 12.4 g/dL (ref 11.1–15.9)
MCH: 32.8 pg (ref 26.6–33.0)
MCHC: 33.8 g/dL (ref 31.5–35.7)
MCV: 97 fL (ref 79–97)
Platelets: 183 10*3/uL (ref 150–450)
RBC: 3.78 x10E6/uL (ref 3.77–5.28)
RDW: 13.4 % (ref 11.7–15.4)
WBC: 7.3 10*3/uL (ref 3.4–10.8)

## 2018-04-16 DIAGNOSIS — H43813 Vitreous degeneration, bilateral: Secondary | ICD-10-CM | POA: Diagnosis not present

## 2018-04-23 DIAGNOSIS — M25552 Pain in left hip: Secondary | ICD-10-CM | POA: Diagnosis not present

## 2018-04-23 DIAGNOSIS — K921 Melena: Secondary | ICD-10-CM | POA: Diagnosis not present

## 2018-04-23 DIAGNOSIS — M160 Bilateral primary osteoarthritis of hip: Secondary | ICD-10-CM | POA: Diagnosis not present

## 2018-04-30 ENCOUNTER — Ambulatory Visit (INDEPENDENT_AMBULATORY_CARE_PROVIDER_SITE_OTHER): Payer: Medicare Other | Admitting: Sports Medicine

## 2018-04-30 ENCOUNTER — Encounter: Payer: Self-pay | Admitting: Sports Medicine

## 2018-04-30 VITALS — BP 93/52 | HR 83 | Resp 16

## 2018-04-30 DIAGNOSIS — E1140 Type 2 diabetes mellitus with diabetic neuropathy, unspecified: Secondary | ICD-10-CM

## 2018-04-30 DIAGNOSIS — L84 Corns and callosities: Secondary | ICD-10-CM

## 2018-04-30 DIAGNOSIS — M2040 Other hammer toe(s) (acquired), unspecified foot: Secondary | ICD-10-CM

## 2018-04-30 DIAGNOSIS — B351 Tinea unguium: Secondary | ICD-10-CM | POA: Diagnosis not present

## 2018-04-30 DIAGNOSIS — M79674 Pain in right toe(s): Secondary | ICD-10-CM | POA: Diagnosis not present

## 2018-04-30 DIAGNOSIS — I739 Peripheral vascular disease, unspecified: Secondary | ICD-10-CM

## 2018-04-30 DIAGNOSIS — M79675 Pain in left toe(s): Secondary | ICD-10-CM

## 2018-04-30 NOTE — Progress Notes (Signed)
Subjective: Shelia Sullivan is a 86 y.o. female patient with history of diabetes who presents to office today complaining of long, painful nails and callus while ambulating in shoes; unable to trim. Patient states that the glucose reading was 179 today, Last saw Dr Leroy Sea 1 week ago and last A1c 6.9.  Patient denies any other new issues at this time.  Patient Active Problem List   Diagnosis Date Noted  . Hematuria 01/06/2018  . Paroxysmal atrial fibrillation ([place]) 09/30/2017  . Dyslipidemia 06/09/2017  . Type 2 diabetes mellitus, without long-term current use of insulin ([place]) 06/08/2017  . Stable angina pectoris ([place]) 06/08/2017  . Rapid atrial fibrillation ([place]) 06/08/2017  . Peripheral vascular disease ([place]) 06/08/2017  . History of GI bleed 06/08/2017  . History of MI (myocardial infarction) 06/08/2017  . Kidney stone 06/08/2017  . Chronic respiratory failure ([place]) 06/08/2017  . Chronic pain 06/08/2017  . Asthma 06/08/2017  . Anxiety 06/08/2017  . Acute chest pain 06/08/2017  . Morbid obesity ([place]) 06/27/2015  . Fibrocystic breast changes of both breasts 06/27/2015   Current Outpatient Medications on File Prior to Visit  Medication Sig Dispense Refill  . ADVAIR DISKUS 250-50 MCG/DOSE AEPB Inhale 1 puff into the lungs 2 (two) times daily at 10 AM and 5 PM.     . albuterol (PROVENTIL HFA;VENTOLIN HFA) 108 (90 Base) MCG/ACT inhaler Inhale 1 puff into the lungs every 6 (six) hours as needed for wheezing or shortness of breath.    Marland Kitchen apixaban (ELIQUIS) 5 MG TABS tablet Take 1 tablet (5 mg total) by mouth 2 (two) times daily. 180 tablet 2  . Ascorbic Acid (VITAMIN C) 100 MG tablet Take 100 mg by mouth daily.    . Baclofen 5 MG TABS TAKE 1 2 TO 1 (ONE HALF TO ONE) TABLET BY MOUTH AT BEDTIME  0  . Calcium Carb-Cholecalciferol (CALCIUM 600+D3 PO) Take by mouth daily.    . carvedilol (COREG) 6.25 MG tablet Take 6.25 mg by mouth 2 (two) times daily.     . diclofenac sodium (VOLTAREN) 1 % GEL      . Dulaglutide (TRULICITY) 1.5 VE/7.2CN SOPN once a week.    . DULoxetine (CYMBALTA) 30 MG capsule Take 30 mg by mouth daily.    . Ergocalciferol (VITAMIN D2 PO) Take by mouth daily.    . fluticasone (FLONASE) 50 MCG/ACT nasal spray Place into the nose as needed.    . furosemide (LASIX) 20 MG tablet Take 20 mg by mouth daily.     Marland Kitchen gabapentin (NEURONTIN) 600 MG tablet Take 600 mg by mouth every 8 (eight) hours as needed.    Marland Kitchen HYDROmorphone (DILAUDID) 4 MG tablet TAKE 1 TABLET BY MOUTH EVERY 6 TO 8 HOURS AS NEEDED FOR CHRONIC PAIN (MAX 4 PER DAY)  0  . LEVEMIR FLEXTOUCH 100 UNIT/ML Pen Inject 50 Units into the skin daily.     Marland Kitchen levothyroxine (SYNTHROID) 50 MCG tablet Take 50 mcg by mouth daily.    Marland Kitchen lisinopril (PRINIVIL,ZESTRIL) 10 MG tablet Take 10 mg by mouth daily.    . Melatonin 5 MG TABS Take 10 mg by mouth at bedtime.    . metFORMIN (GLUCOPHAGE) 500 MG tablet 500 mg daily.    . MORPHINE SULFATE PO Take by mouth every 6 (six) hours as needed.    Marland Kitchen omeprazole (PRILOSEC) 20 MG capsule Take 20 mg by mouth daily.     . pravastatin (PRAVACHOL) 80 MG tablet Take 80 mg by mouth at  bedtime.     Marland Kitchen QUEtiapine (SEROQUEL) 200 MG tablet Take 200 mg by mouth daily.     . SURE COMFORT PEN NEEDLES 32G X 6 MM MISC     . trimethoprim (TRIMPEX) 100 MG tablet Take by mouth daily.     No current facility-administered medications on file prior to visit.    Allergies  Allergen Reactions  . Codeine   . Nsaids Other (See Comments)    GI Bleeding  . Tolmetin Other (See Comments)    GI Bleeding    Recent Results (from the past 2160 hour(s))  CBC     Status: None   Collection Time: 04/08/18  2:10 PM  Result Value Ref Range   WBC 7.3 3.4 - 10.8 x10E3/uL   RBC 3.78 3.77 - 5.28 x10E6/uL   Hemoglobin 12.4 11.1 - 15.9 g/dL   Hematocrit 36.7 34.0 - 46.6 %   MCV 97 79 - 97 fL   MCH 32.8 26.6 - 33.0 pg   MCHC 33.8 31.5 - 35.7 g/dL   RDW 13.4 11.7 - 15.4 %   Platelets 183 150 - 450 [id number]/IW  Basic  metabolic panel     Status: Abnormal   Collection Time: 04/08/18  2:10 PM  Result Value Ref Range   Glucose 72 65 - 99 mg/dL   BUN 18 8 - 27 mg/dL   Creatinine, Ser 0.69 0.57 - 1.00 mg/dL   GFR calc non Af Amer 80 >59 mL/min/1.73   GFR calc Af Amer 92 >59 mL/min/1.73   BUN/Creatinine Ratio 26 12 - 28   Sodium 146 (H) 134 - 144 mmol/L   Potassium 4.8 3.5 - 5.2 mmol/L   Chloride 104 96 - 106 mmol/L   CO2 30 (H) 20 - 29 mmol/L   Calcium 11.3 (H) 8.7 - 10.3 mg/dL    Objective: General: Patient is awake, alert, and oriented x 3 and in no acute distress. On Oxygen.  Walker assisted gait.   Integument: Skin is dry and supple bilateral. Nails are tender, long, thickened and dystrophic with subungual debris, consistent with onychomycosis, 2-5 bilateral.  There is no hallux nails bilateral from previous removal. No signs of infection. + callus at tips of 3rd toes bilateral. Remaining integument unremarkable.  Vasculature:  Dorsalis Pedis pulse 1/4 bilateral. Posterior Tibial pulse  0/4 bilateral. Capillary fill time <5 sec 1-5 bilateral. Scant hair growth to the level of the digits.Temperature gradient mildly decreased bilateral. + varicosities present bilateral. Trace edema present bilateral, L>R chronic.   Neurology: The patient has absent sensation measured with a 5.07/10g Semmes Weinstein Monofilament at all pedal sites bilateral . Vibratory sensation diminished bilateral with tuning fork. No Babinski sign present bilateral.   Musculoskeletal: Asymptomatic hammertoe and fat pad atrophy with planus pedal deformities noted bilateral. Muscular strength 5/5 in all lower extremity muscular groups bilateral without pain on range of motion however stiffness likely arthritis present. No tenderness with calf compression bilateral.  Assessment and Plan: Problem List Items Addressed This Visit    None    Visit Diagnoses    Pain due to onychomycosis of toenails of both feet    -  Primary   Callus of  foot       Hammer toe, unspecified laterality       Type 2 diabetes, controlled, with neuropathy (HCC)       PVD (peripheral vascular disease) ([place])         -Examined patient. -Discussed and educated patient on diabetic foot care,  especially with  regards to the vascular, neurological and musculoskeletal systems.  -Mechanically debrided callus x 2 using rotary bur at no charge without incident -Mechanically debrided  all nails 2-5 bilateral using sterile nail nipper and filed with dremel without incident  -Dispensed toe caps -Patient to return  in 3 months for at risk foot care -Patient advised to call the office if any problems or questions arise in the meantime.  Landis Martins, DPM

## 2018-05-04 DIAGNOSIS — M25552 Pain in left hip: Secondary | ICD-10-CM | POA: Diagnosis not present

## 2018-05-13 ENCOUNTER — Other Ambulatory Visit: Payer: Self-pay

## 2018-06-02 DIAGNOSIS — M25552 Pain in left hip: Secondary | ICD-10-CM | POA: Diagnosis not present

## 2018-06-02 DIAGNOSIS — E7800 Pure hypercholesterolemia, unspecified: Secondary | ICD-10-CM | POA: Diagnosis not present

## 2018-06-02 DIAGNOSIS — E1122 Type 2 diabetes mellitus with diabetic chronic kidney disease: Secondary | ICD-10-CM | POA: Diagnosis not present

## 2018-06-04 DIAGNOSIS — M5136 Other intervertebral disc degeneration, lumbar region: Secondary | ICD-10-CM | POA: Diagnosis not present

## 2018-06-04 DIAGNOSIS — G4733 Obstructive sleep apnea (adult) (pediatric): Secondary | ICD-10-CM | POA: Diagnosis not present

## 2018-06-04 DIAGNOSIS — M159 Polyosteoarthritis, unspecified: Secondary | ICD-10-CM | POA: Diagnosis not present

## 2018-06-04 DIAGNOSIS — E119 Type 2 diabetes mellitus without complications: Secondary | ICD-10-CM | POA: Diagnosis not present

## 2018-06-04 DIAGNOSIS — G894 Chronic pain syndrome: Secondary | ICD-10-CM | POA: Diagnosis not present

## 2018-06-04 DIAGNOSIS — M5090 Cervical disc disorder, unspecified, unspecified cervical region: Secondary | ICD-10-CM | POA: Diagnosis not present

## 2018-06-04 DIAGNOSIS — Z79891 Long term (current) use of opiate analgesic: Secondary | ICD-10-CM | POA: Diagnosis not present

## 2018-06-04 DIAGNOSIS — M1611 Unilateral primary osteoarthritis, right hip: Secondary | ICD-10-CM | POA: Diagnosis not present

## 2018-06-04 DIAGNOSIS — M47816 Spondylosis without myelopathy or radiculopathy, lumbar region: Secondary | ICD-10-CM | POA: Diagnosis not present

## 2018-06-04 DIAGNOSIS — J45909 Unspecified asthma, uncomplicated: Secondary | ICD-10-CM | POA: Diagnosis not present

## 2018-06-10 DIAGNOSIS — E559 Vitamin D deficiency, unspecified: Secondary | ICD-10-CM | POA: Diagnosis not present

## 2018-06-10 DIAGNOSIS — G4733 Obstructive sleep apnea (adult) (pediatric): Secondary | ICD-10-CM | POA: Diagnosis not present

## 2018-06-10 DIAGNOSIS — J4520 Mild intermittent asthma, uncomplicated: Secondary | ICD-10-CM | POA: Diagnosis not present

## 2018-06-10 DIAGNOSIS — R5383 Other fatigue: Secondary | ICD-10-CM | POA: Diagnosis not present

## 2018-06-10 DIAGNOSIS — J301 Allergic rhinitis due to pollen: Secondary | ICD-10-CM | POA: Diagnosis not present

## 2018-06-29 ENCOUNTER — Other Ambulatory Visit: Payer: Self-pay | Admitting: Cardiology

## 2018-07-01 DIAGNOSIS — E119 Type 2 diabetes mellitus without complications: Secondary | ICD-10-CM | POA: Diagnosis not present

## 2018-07-01 DIAGNOSIS — M47816 Spondylosis without myelopathy or radiculopathy, lumbar region: Secondary | ICD-10-CM | POA: Diagnosis not present

## 2018-07-01 DIAGNOSIS — M47812 Spondylosis without myelopathy or radiculopathy, cervical region: Secondary | ICD-10-CM | POA: Diagnosis not present

## 2018-07-01 DIAGNOSIS — M159 Polyosteoarthritis, unspecified: Secondary | ICD-10-CM | POA: Diagnosis not present

## 2018-07-01 DIAGNOSIS — M5090 Cervical disc disorder, unspecified, unspecified cervical region: Secondary | ICD-10-CM | POA: Diagnosis not present

## 2018-07-01 DIAGNOSIS — G894 Chronic pain syndrome: Secondary | ICD-10-CM | POA: Diagnosis not present

## 2018-07-01 DIAGNOSIS — M1611 Unilateral primary osteoarthritis, right hip: Secondary | ICD-10-CM | POA: Diagnosis not present

## 2018-07-01 DIAGNOSIS — G4733 Obstructive sleep apnea (adult) (pediatric): Secondary | ICD-10-CM | POA: Diagnosis not present

## 2018-07-01 DIAGNOSIS — Z79891 Long term (current) use of opiate analgesic: Secondary | ICD-10-CM | POA: Diagnosis not present

## 2018-07-01 DIAGNOSIS — M5136 Other intervertebral disc degeneration, lumbar region: Secondary | ICD-10-CM | POA: Diagnosis not present

## 2018-07-01 DIAGNOSIS — J45909 Unspecified asthma, uncomplicated: Secondary | ICD-10-CM | POA: Diagnosis not present

## 2018-07-06 DIAGNOSIS — Z0000 Encounter for general adult medical examination without abnormal findings: Secondary | ICD-10-CM | POA: Diagnosis not present

## 2018-07-06 DIAGNOSIS — E785 Hyperlipidemia, unspecified: Secondary | ICD-10-CM | POA: Diagnosis not present

## 2018-07-06 DIAGNOSIS — Z1231 Encounter for screening mammogram for malignant neoplasm of breast: Secondary | ICD-10-CM | POA: Diagnosis not present

## 2018-07-06 DIAGNOSIS — Z9181 History of falling: Secondary | ICD-10-CM | POA: Diagnosis not present

## 2018-07-06 DIAGNOSIS — Z1331 Encounter for screening for depression: Secondary | ICD-10-CM | POA: Diagnosis not present

## 2018-07-15 DIAGNOSIS — T402X5A Adverse effect of other opioids, initial encounter: Secondary | ICD-10-CM | POA: Diagnosis not present

## 2018-07-15 DIAGNOSIS — Z791 Long term (current) use of non-steroidal anti-inflammatories (NSAID): Secondary | ICD-10-CM | POA: Diagnosis not present

## 2018-07-15 DIAGNOSIS — K921 Melena: Secondary | ICD-10-CM | POA: Diagnosis not present

## 2018-07-15 DIAGNOSIS — K5903 Drug induced constipation: Secondary | ICD-10-CM | POA: Diagnosis not present

## 2018-07-29 ENCOUNTER — Encounter: Payer: Self-pay | Admitting: Sports Medicine

## 2018-07-29 ENCOUNTER — Ambulatory Visit (INDEPENDENT_AMBULATORY_CARE_PROVIDER_SITE_OTHER): Payer: Medicare Other | Admitting: Sports Medicine

## 2018-07-29 ENCOUNTER — Other Ambulatory Visit: Payer: Self-pay

## 2018-07-29 VITALS — BP 109/59 | HR 80 | Temp 97.1°F | Resp 16

## 2018-07-29 DIAGNOSIS — M79674 Pain in right toe(s): Secondary | ICD-10-CM

## 2018-07-29 DIAGNOSIS — Z79891 Long term (current) use of opiate analgesic: Secondary | ICD-10-CM | POA: Diagnosis not present

## 2018-07-29 DIAGNOSIS — M47812 Spondylosis without myelopathy or radiculopathy, cervical region: Secondary | ICD-10-CM | POA: Diagnosis not present

## 2018-07-29 DIAGNOSIS — M79675 Pain in left toe(s): Secondary | ICD-10-CM | POA: Diagnosis not present

## 2018-07-29 DIAGNOSIS — M159 Polyosteoarthritis, unspecified: Secondary | ICD-10-CM | POA: Diagnosis not present

## 2018-07-29 DIAGNOSIS — M5136 Other intervertebral disc degeneration, lumbar region: Secondary | ICD-10-CM | POA: Diagnosis not present

## 2018-07-29 DIAGNOSIS — B351 Tinea unguium: Secondary | ICD-10-CM | POA: Diagnosis not present

## 2018-07-29 DIAGNOSIS — I739 Peripheral vascular disease, unspecified: Secondary | ICD-10-CM

## 2018-07-29 DIAGNOSIS — G894 Chronic pain syndrome: Secondary | ICD-10-CM | POA: Diagnosis not present

## 2018-07-29 DIAGNOSIS — G4733 Obstructive sleep apnea (adult) (pediatric): Secondary | ICD-10-CM | POA: Diagnosis not present

## 2018-07-29 DIAGNOSIS — M5090 Cervical disc disorder, unspecified, unspecified cervical region: Secondary | ICD-10-CM | POA: Diagnosis not present

## 2018-07-29 DIAGNOSIS — E1140 Type 2 diabetes mellitus with diabetic neuropathy, unspecified: Secondary | ICD-10-CM

## 2018-07-29 DIAGNOSIS — M1611 Unilateral primary osteoarthritis, right hip: Secondary | ICD-10-CM | POA: Diagnosis not present

## 2018-07-29 DIAGNOSIS — M47816 Spondylosis without myelopathy or radiculopathy, lumbar region: Secondary | ICD-10-CM | POA: Diagnosis not present

## 2018-07-29 DIAGNOSIS — L84 Corns and callosities: Secondary | ICD-10-CM

## 2018-07-29 DIAGNOSIS — J45909 Unspecified asthma, uncomplicated: Secondary | ICD-10-CM | POA: Diagnosis not present

## 2018-07-29 DIAGNOSIS — M2040 Other hammer toe(s) (acquired), unspecified foot: Secondary | ICD-10-CM

## 2018-07-29 DIAGNOSIS — E119 Type 2 diabetes mellitus without complications: Secondary | ICD-10-CM | POA: Diagnosis not present

## 2018-07-29 NOTE — Progress Notes (Signed)
Subjective: Shelia Sullivan is a 85 y.o. female patient with history of diabetes who presents to office today complaining of long, painful nails and callus while ambulating in shoes; unable to trim. Patient states that the glucose reading was 97, four days ago, Last saw Dr Leroy Sea few weeks ago and last A1c 9.7.  Patient denies any other new issues at this time.  Patient Active Problem List   Diagnosis Date Noted  . Hematuria 01/06/2018  . Paroxysmal atrial fibrillation ([place]) 09/30/2017  . Dyslipidemia 06/09/2017  . Type 2 diabetes mellitus, without long-term current use of insulin ([place]) 06/08/2017  . Stable angina pectoris ([place]) 06/08/2017  . Rapid atrial fibrillation ([place]) 06/08/2017  . Peripheral vascular disease ([place]) 06/08/2017  . History of GI bleed 06/08/2017  . History of MI (myocardial infarction) 06/08/2017  . Kidney stone 06/08/2017  . Chronic respiratory failure ([place]) 06/08/2017  . Chronic pain 06/08/2017  . Asthma 06/08/2017  . Anxiety 06/08/2017  . Acute chest pain 06/08/2017  . Morbid obesity ([place]) 06/27/2015  . Fibrocystic breast changes of both breasts 06/27/2015   Current Outpatient Medications on File Prior to Visit  Medication Sig Dispense Refill  . ADVAIR DISKUS 250-50 MCG/DOSE AEPB Inhale 1 puff into the lungs 2 (two) times daily at 10 AM and 5 PM.     . albuterol (PROVENTIL HFA;VENTOLIN HFA) 108 (90 Base) MCG/ACT inhaler Inhale 1 puff into the lungs every 6 (six) hours as needed for wheezing or shortness of breath.    . Ascorbic Acid (VITAMIN C) 100 MG tablet Take 100 mg by mouth daily.    . Baclofen 5 MG TABS TAKE 1 2 TO 1 (ONE HALF TO ONE) TABLET BY MOUTH AT BEDTIME  0  . Calcium Carb-Cholecalciferol (CALCIUM 600+D3 PO) Take by mouth daily.    . carvedilol (COREG) 6.25 MG tablet Take 6.25 mg by mouth 2 (two) times daily.     . diclofenac sodium (VOLTAREN) 1 % GEL     . Dulaglutide (TRULICITY) 1.5 HG/9.9ME SOPN once a week.    . DULoxetine (CYMBALTA) 30 MG  capsule Take 30 mg by mouth daily.    Marland Kitchen ELIQUIS 5 MG TABS tablet TAKE 1 TABLET TWICE A DAY 180 tablet 3  . Ergocalciferol (VITAMIN D2 PO) Take by mouth daily.    . fluticasone (FLONASE) 50 MCG/ACT nasal spray Place into the nose as needed.    . furosemide (LASIX) 20 MG tablet Take 20 mg by mouth daily.     Marland Kitchen gabapentin (NEURONTIN) 600 MG tablet Take 600 mg by mouth every 8 (eight) hours as needed.    Marland Kitchen HYDROmorphone (DILAUDID) 4 MG tablet TAKE 1 TABLET BY MOUTH EVERY 6 TO 8 HOURS AS NEEDED FOR CHRONIC PAIN (MAX 4 PER DAY)  0  . LEVEMIR FLEXTOUCH 100 UNIT/ML Pen Inject 50 Units into the skin daily.     Marland Kitchen levothyroxine (SYNTHROID) 50 MCG tablet Take 50 mcg by mouth daily.    Marland Kitchen lisinopril (PRINIVIL,ZESTRIL) 10 MG tablet Take 10 mg by mouth daily.    . Melatonin 5 MG TABS Take 10 mg by mouth at bedtime.    . metFORMIN (GLUCOPHAGE) 500 MG tablet 500 mg daily.    . MORPHINE SULFATE PO Take by mouth every 6 (six) hours as needed.    Marland Kitchen omeprazole (PRILOSEC) 20 MG capsule Take 20 mg by mouth daily.     . pravastatin (PRAVACHOL) 80 MG tablet Take 80 mg by mouth at bedtime.     Marland Kitchen  QUEtiapine (SEROQUEL) 200 MG tablet Take 200 mg by mouth daily.     . SURE COMFORT PEN NEEDLES 32G X 6 MM MISC     . trimethoprim (TRIMPEX) 100 MG tablet Take by mouth daily.     No current facility-administered medications on file prior to visit.    Allergies  Allergen Reactions  . Codeine   . Nsaids Other (See Comments)    GI Bleeding  . Tolmetin Other (See Comments)    GI Bleeding    No results found for this or any previous visit (from the past 2160 hour(s)).  Objective: General: Patient is awake, alert, and oriented x 3 and in no acute distress.  Integument: Skin is dry and supple bilateral. Nails are tender, long, thickened and dystrophic with subungual debris, consistent with onychomycosis, 2-5 bilateral.  There is no hallux nails bilateral from previous removal. No signs of infection. + callus at tips of 3rd  toes bilateral. Remaining integument unremarkable.  Vasculature:  Dorsalis Pedis pulse 1/4 bilateral. Posterior Tibial pulse  0/4 bilateral. Capillary fill time <5 sec 1-5 bilateral. Scant hair growth to the level of the digits.Temperature gradient mildly decreased bilateral. + varicosities present bilateral. Trace edema present bilateral, L>R chronic.   Neurology: The patient has absent sensation measured with a 5.07/10g Semmes Weinstein Monofilament at all pedal sites bilateral . Vibratory sensation diminished bilateral with tuning fork. No Babinski sign present bilateral.   Musculoskeletal: Asymptomatic hammertoe and fat pad atrophy with planus pedal deformities noted bilateral. Muscular strength 5/5 in all lower extremity muscular groups bilateral without pain on range of motion however stiffness likely arthritis present. No tenderness with calf compression bilateral.  Assessment and Plan: Problem List Items Addressed This Visit    None    Visit Diagnoses    Pain due to onychomycosis of toenails of both feet    -  Primary   Callus of foot       Hammer toe, unspecified laterality       Type 2 diabetes, controlled, with neuropathy (HCC)       PVD (peripheral vascular disease) ([place])         -Examined patient. -Discussed and educated patient on diabetic foot care, especially with  regards to the vascular, neurological and musculoskeletal systems.  -Mechanically debrided callus x 2 using rotary bur at no charge without incident -Mechanically debrided  all nails 2-5 bilateral using sterile nail nipper and filed with dremel without incident  -Patient to return  in 3 months for at risk foot care -Patient advised to call the office if any problems or questions arise in the meantime.  Landis Martins, DPM

## 2018-08-03 DIAGNOSIS — M25511 Pain in right shoulder: Secondary | ICD-10-CM | POA: Diagnosis not present

## 2018-08-03 DIAGNOSIS — N183 Chronic kidney disease, stage 3 (moderate): Secondary | ICD-10-CM | POA: Diagnosis not present

## 2018-08-03 DIAGNOSIS — E7800 Pure hypercholesterolemia, unspecified: Secondary | ICD-10-CM | POA: Diagnosis not present

## 2018-08-10 DIAGNOSIS — M25511 Pain in right shoulder: Secondary | ICD-10-CM | POA: Diagnosis not present

## 2018-08-10 DIAGNOSIS — E538 Deficiency of other specified B group vitamins: Secondary | ICD-10-CM | POA: Diagnosis not present

## 2018-08-10 DIAGNOSIS — M12811 Other specific arthropathies, not elsewhere classified, right shoulder: Secondary | ICD-10-CM | POA: Diagnosis not present

## 2018-08-10 DIAGNOSIS — G8929 Other chronic pain: Secondary | ICD-10-CM | POA: Diagnosis not present

## 2018-08-10 DIAGNOSIS — E1122 Type 2 diabetes mellitus with diabetic chronic kidney disease: Secondary | ICD-10-CM | POA: Diagnosis not present

## 2018-08-10 DIAGNOSIS — E7800 Pure hypercholesterolemia, unspecified: Secondary | ICD-10-CM | POA: Diagnosis not present

## 2018-08-10 DIAGNOSIS — E039 Hypothyroidism, unspecified: Secondary | ICD-10-CM | POA: Diagnosis not present

## 2018-08-11 DIAGNOSIS — E538 Deficiency of other specified B group vitamins: Secondary | ICD-10-CM | POA: Diagnosis not present

## 2018-08-11 DIAGNOSIS — M1611 Unilateral primary osteoarthritis, right hip: Secondary | ICD-10-CM | POA: Diagnosis not present

## 2018-08-11 DIAGNOSIS — M7062 Trochanteric bursitis, left hip: Secondary | ICD-10-CM | POA: Diagnosis not present

## 2018-08-19 DIAGNOSIS — N390 Urinary tract infection, site not specified: Secondary | ICD-10-CM | POA: Diagnosis not present

## 2018-08-19 DIAGNOSIS — N3281 Overactive bladder: Secondary | ICD-10-CM | POA: Diagnosis not present

## 2018-08-19 DIAGNOSIS — E538 Deficiency of other specified B group vitamins: Secondary | ICD-10-CM | POA: Diagnosis not present

## 2018-08-21 DIAGNOSIS — Z1159 Encounter for screening for other viral diseases: Secondary | ICD-10-CM | POA: Diagnosis not present

## 2018-08-21 DIAGNOSIS — Z03818 Encounter for observation for suspected exposure to other biological agents ruled out: Secondary | ICD-10-CM | POA: Diagnosis not present

## 2018-08-21 DIAGNOSIS — Z01812 Encounter for preprocedural laboratory examination: Secondary | ICD-10-CM | POA: Diagnosis not present

## 2018-08-25 DIAGNOSIS — Z9989 Dependence on other enabling machines and devices: Secondary | ICD-10-CM | POA: Diagnosis not present

## 2018-08-25 DIAGNOSIS — I10 Essential (primary) hypertension: Secondary | ICD-10-CM | POA: Diagnosis not present

## 2018-08-25 DIAGNOSIS — Z7984 Long term (current) use of oral hypoglycemic drugs: Secondary | ICD-10-CM | POA: Diagnosis not present

## 2018-08-25 DIAGNOSIS — K2960 Other gastritis without bleeding: Secondary | ICD-10-CM | POA: Diagnosis not present

## 2018-08-25 DIAGNOSIS — Z87891 Personal history of nicotine dependence: Secondary | ICD-10-CM | POA: Diagnosis not present

## 2018-08-25 DIAGNOSIS — E785 Hyperlipidemia, unspecified: Secondary | ICD-10-CM | POA: Diagnosis not present

## 2018-08-25 DIAGNOSIS — K921 Melena: Secondary | ICD-10-CM | POA: Diagnosis not present

## 2018-08-25 DIAGNOSIS — K2970 Gastritis, unspecified, without bleeding: Secondary | ICD-10-CM | POA: Diagnosis not present

## 2018-08-25 DIAGNOSIS — K922 Gastrointestinal hemorrhage, unspecified: Secondary | ICD-10-CM | POA: Diagnosis not present

## 2018-08-25 DIAGNOSIS — I2510 Atherosclerotic heart disease of native coronary artery without angina pectoris: Secondary | ICD-10-CM | POA: Diagnosis not present

## 2018-08-25 DIAGNOSIS — E119 Type 2 diabetes mellitus without complications: Secondary | ICD-10-CM | POA: Diagnosis not present

## 2018-08-25 DIAGNOSIS — Z7901 Long term (current) use of anticoagulants: Secondary | ICD-10-CM | POA: Diagnosis not present

## 2018-08-25 DIAGNOSIS — K3189 Other diseases of stomach and duodenum: Secondary | ICD-10-CM | POA: Diagnosis not present

## 2018-08-25 DIAGNOSIS — Z79899 Other long term (current) drug therapy: Secondary | ICD-10-CM | POA: Diagnosis not present

## 2018-08-25 DIAGNOSIS — G4733 Obstructive sleep apnea (adult) (pediatric): Secondary | ICD-10-CM | POA: Diagnosis not present

## 2018-08-25 DIAGNOSIS — E7800 Pure hypercholesterolemia, unspecified: Secondary | ICD-10-CM | POA: Diagnosis not present

## 2018-08-25 DIAGNOSIS — Z955 Presence of coronary angioplasty implant and graft: Secondary | ICD-10-CM | POA: Diagnosis not present

## 2018-08-25 DIAGNOSIS — K219 Gastro-esophageal reflux disease without esophagitis: Secondary | ICD-10-CM | POA: Diagnosis not present

## 2018-08-26 DIAGNOSIS — M5090 Cervical disc disorder, unspecified, unspecified cervical region: Secondary | ICD-10-CM | POA: Diagnosis not present

## 2018-08-26 DIAGNOSIS — D649 Anemia, unspecified: Secondary | ICD-10-CM | POA: Diagnosis not present

## 2018-08-26 DIAGNOSIS — M47812 Spondylosis without myelopathy or radiculopathy, cervical region: Secondary | ICD-10-CM | POA: Diagnosis not present

## 2018-08-26 DIAGNOSIS — Z79891 Long term (current) use of opiate analgesic: Secondary | ICD-10-CM | POA: Diagnosis not present

## 2018-08-26 DIAGNOSIS — Z1389 Encounter for screening for other disorder: Secondary | ICD-10-CM | POA: Diagnosis not present

## 2018-08-26 DIAGNOSIS — M47816 Spondylosis without myelopathy or radiculopathy, lumbar region: Secondary | ICD-10-CM | POA: Diagnosis not present

## 2018-08-26 DIAGNOSIS — E538 Deficiency of other specified B group vitamins: Secondary | ICD-10-CM | POA: Diagnosis not present

## 2018-08-26 DIAGNOSIS — M5136 Other intervertebral disc degeneration, lumbar region: Secondary | ICD-10-CM | POA: Diagnosis not present

## 2018-08-26 DIAGNOSIS — G894 Chronic pain syndrome: Secondary | ICD-10-CM | POA: Diagnosis not present

## 2018-08-26 DIAGNOSIS — M797 Fibromyalgia: Secondary | ICD-10-CM | POA: Diagnosis not present

## 2018-08-26 DIAGNOSIS — M159 Polyosteoarthritis, unspecified: Secondary | ICD-10-CM | POA: Diagnosis not present

## 2018-08-26 DIAGNOSIS — M1611 Unilateral primary osteoarthritis, right hip: Secondary | ICD-10-CM | POA: Diagnosis not present

## 2018-09-01 DIAGNOSIS — E538 Deficiency of other specified B group vitamins: Secondary | ICD-10-CM | POA: Diagnosis not present

## 2018-09-07 DIAGNOSIS — M1611 Unilateral primary osteoarthritis, right hip: Secondary | ICD-10-CM | POA: Diagnosis not present

## 2018-09-21 DIAGNOSIS — Z1389 Encounter for screening for other disorder: Secondary | ICD-10-CM | POA: Diagnosis not present

## 2018-09-21 DIAGNOSIS — M47816 Spondylosis without myelopathy or radiculopathy, lumbar region: Secondary | ICD-10-CM | POA: Diagnosis not present

## 2018-09-21 DIAGNOSIS — Z79891 Long term (current) use of opiate analgesic: Secondary | ICD-10-CM | POA: Diagnosis not present

## 2018-09-21 DIAGNOSIS — M5136 Other intervertebral disc degeneration, lumbar region: Secondary | ICD-10-CM | POA: Diagnosis not present

## 2018-09-21 DIAGNOSIS — G894 Chronic pain syndrome: Secondary | ICD-10-CM | POA: Diagnosis not present

## 2018-09-29 DIAGNOSIS — M7062 Trochanteric bursitis, left hip: Secondary | ICD-10-CM | POA: Diagnosis not present

## 2018-09-29 DIAGNOSIS — M1611 Unilateral primary osteoarthritis, right hip: Secondary | ICD-10-CM | POA: Diagnosis not present

## 2018-10-05 DIAGNOSIS — E538 Deficiency of other specified B group vitamins: Secondary | ICD-10-CM | POA: Diagnosis not present

## 2018-10-14 DIAGNOSIS — Z139 Encounter for screening, unspecified: Secondary | ICD-10-CM | POA: Diagnosis not present

## 2018-10-14 DIAGNOSIS — E538 Deficiency of other specified B group vitamins: Secondary | ICD-10-CM | POA: Diagnosis not present

## 2018-10-14 DIAGNOSIS — E039 Hypothyroidism, unspecified: Secondary | ICD-10-CM | POA: Diagnosis not present

## 2018-10-14 DIAGNOSIS — N183 Chronic kidney disease, stage 3 (moderate): Secondary | ICD-10-CM | POA: Diagnosis not present

## 2018-10-14 DIAGNOSIS — N6310 Unspecified lump in the right breast, unspecified quadrant: Secondary | ICD-10-CM | POA: Diagnosis not present

## 2018-10-14 DIAGNOSIS — N644 Mastodynia: Secondary | ICD-10-CM | POA: Diagnosis not present

## 2018-10-21 DIAGNOSIS — K921 Melena: Secondary | ICD-10-CM | POA: Diagnosis not present

## 2018-10-21 DIAGNOSIS — K319 Disease of stomach and duodenum, unspecified: Secondary | ICD-10-CM | POA: Diagnosis not present

## 2018-10-28 DIAGNOSIS — R928 Other abnormal and inconclusive findings on diagnostic imaging of breast: Secondary | ICD-10-CM | POA: Diagnosis not present

## 2018-10-28 DIAGNOSIS — N6311 Unspecified lump in the right breast, upper outer quadrant: Secondary | ICD-10-CM | POA: Diagnosis not present

## 2018-10-28 DIAGNOSIS — N6489 Other specified disorders of breast: Secondary | ICD-10-CM | POA: Diagnosis not present

## 2018-10-28 DIAGNOSIS — N644 Mastodynia: Secondary | ICD-10-CM | POA: Diagnosis not present

## 2018-10-29 ENCOUNTER — Ambulatory Visit: Payer: Medicare Other | Admitting: Sports Medicine

## 2018-10-30 DIAGNOSIS — R4182 Altered mental status, unspecified: Secondary | ICD-10-CM | POA: Diagnosis not present

## 2018-10-30 DIAGNOSIS — S3991XA Unspecified injury of abdomen, initial encounter: Secondary | ICD-10-CM | POA: Diagnosis not present

## 2018-10-30 DIAGNOSIS — J432 Centrilobular emphysema: Secondary | ICD-10-CM | POA: Diagnosis not present

## 2018-10-30 DIAGNOSIS — I252 Old myocardial infarction: Secondary | ICD-10-CM | POA: Diagnosis not present

## 2018-10-30 DIAGNOSIS — I361 Nonrheumatic tricuspid (valve) insufficiency: Secondary | ICD-10-CM | POA: Diagnosis not present

## 2018-10-30 DIAGNOSIS — Z7982 Long term (current) use of aspirin: Secondary | ICD-10-CM | POA: Diagnosis not present

## 2018-10-30 DIAGNOSIS — J9811 Atelectasis: Secondary | ICD-10-CM | POA: Diagnosis not present

## 2018-10-30 DIAGNOSIS — N132 Hydronephrosis with renal and ureteral calculous obstruction: Secondary | ICD-10-CM | POA: Diagnosis present

## 2018-10-30 DIAGNOSIS — M1990 Unspecified osteoarthritis, unspecified site: Secondary | ICD-10-CM | POA: Diagnosis present

## 2018-10-30 DIAGNOSIS — N200 Calculus of kidney: Secondary | ICD-10-CM | POA: Diagnosis not present

## 2018-10-30 DIAGNOSIS — R6521 Severe sepsis with septic shock: Secondary | ICD-10-CM | POA: Diagnosis present

## 2018-10-30 DIAGNOSIS — I340 Nonrheumatic mitral (valve) insufficiency: Secondary | ICD-10-CM | POA: Diagnosis not present

## 2018-10-30 DIAGNOSIS — S299XXA Unspecified injury of thorax, initial encounter: Secondary | ICD-10-CM | POA: Diagnosis not present

## 2018-10-30 DIAGNOSIS — S3993XA Unspecified injury of pelvis, initial encounter: Secondary | ICD-10-CM | POA: Diagnosis not present

## 2018-10-30 DIAGNOSIS — I2510 Atherosclerotic heart disease of native coronary artery without angina pectoris: Secondary | ICD-10-CM | POA: Diagnosis present

## 2018-10-30 DIAGNOSIS — I110 Hypertensive heart disease with heart failure: Secondary | ICD-10-CM | POA: Diagnosis present

## 2018-10-30 DIAGNOSIS — E785 Hyperlipidemia, unspecified: Secondary | ICD-10-CM | POA: Diagnosis present

## 2018-10-30 DIAGNOSIS — S62307A Unspecified fracture of fifth metacarpal bone, left hand, initial encounter for closed fracture: Secondary | ICD-10-CM | POA: Diagnosis not present

## 2018-10-30 DIAGNOSIS — R531 Weakness: Secondary | ICD-10-CM | POA: Diagnosis not present

## 2018-10-30 DIAGNOSIS — Z885 Allergy status to narcotic agent status: Secondary | ICD-10-CM | POA: Diagnosis not present

## 2018-10-30 DIAGNOSIS — Z20828 Contact with and (suspected) exposure to other viral communicable diseases: Secondary | ICD-10-CM | POA: Diagnosis not present

## 2018-10-30 DIAGNOSIS — Z7901 Long term (current) use of anticoagulants: Secondary | ICD-10-CM | POA: Diagnosis not present

## 2018-10-30 DIAGNOSIS — E119 Type 2 diabetes mellitus without complications: Secondary | ICD-10-CM | POA: Diagnosis present

## 2018-10-30 DIAGNOSIS — Z888 Allergy status to other drugs, medicaments and biological substances status: Secondary | ICD-10-CM | POA: Diagnosis not present

## 2018-10-30 DIAGNOSIS — Z03818 Encounter for observation for suspected exposure to other biological agents ruled out: Secondary | ICD-10-CM | POA: Diagnosis not present

## 2018-10-30 DIAGNOSIS — Z9181 History of falling: Secondary | ICD-10-CM | POA: Diagnosis not present

## 2018-10-30 DIAGNOSIS — I10 Essential (primary) hypertension: Secondary | ICD-10-CM | POA: Diagnosis not present

## 2018-10-30 DIAGNOSIS — J449 Chronic obstructive pulmonary disease, unspecified: Secondary | ICD-10-CM | POA: Diagnosis not present

## 2018-10-30 DIAGNOSIS — R9089 Other abnormal findings on diagnostic imaging of central nervous system: Secondary | ICD-10-CM | POA: Diagnosis not present

## 2018-10-30 DIAGNOSIS — Z7984 Long term (current) use of oral hypoglycemic drugs: Secondary | ICD-10-CM | POA: Diagnosis not present

## 2018-10-30 DIAGNOSIS — B965 Pseudomonas (aeruginosa) (mallei) (pseudomallei) as the cause of diseases classified elsewhere: Secondary | ICD-10-CM | POA: Diagnosis present

## 2018-10-30 DIAGNOSIS — S022XXA Fracture of nasal bones, initial encounter for closed fracture: Secondary | ICD-10-CM | POA: Diagnosis not present

## 2018-10-30 DIAGNOSIS — S0990XA Unspecified injury of head, initial encounter: Secondary | ICD-10-CM | POA: Diagnosis not present

## 2018-10-30 DIAGNOSIS — N136 Pyonephrosis: Secondary | ICD-10-CM | POA: Diagnosis not present

## 2018-10-30 DIAGNOSIS — G8929 Other chronic pain: Secondary | ICD-10-CM | POA: Diagnosis not present

## 2018-10-30 DIAGNOSIS — J439 Emphysema, unspecified: Secondary | ICD-10-CM | POA: Diagnosis present

## 2018-10-30 DIAGNOSIS — D649 Anemia, unspecified: Secondary | ICD-10-CM | POA: Diagnosis present

## 2018-10-30 DIAGNOSIS — E039 Hypothyroidism, unspecified: Secondary | ICD-10-CM | POA: Diagnosis present

## 2018-10-30 DIAGNOSIS — I350 Nonrheumatic aortic (valve) stenosis: Secondary | ICD-10-CM | POA: Diagnosis not present

## 2018-10-30 DIAGNOSIS — F418 Other specified anxiety disorders: Secondary | ICD-10-CM | POA: Diagnosis present

## 2018-10-30 DIAGNOSIS — Z87891 Personal history of nicotine dependence: Secondary | ICD-10-CM | POA: Diagnosis not present

## 2018-10-30 DIAGNOSIS — I5032 Chronic diastolic (congestive) heart failure: Secondary | ICD-10-CM | POA: Diagnosis not present

## 2018-10-30 DIAGNOSIS — Z87440 Personal history of urinary (tract) infections: Secondary | ICD-10-CM | POA: Diagnosis not present

## 2018-10-30 DIAGNOSIS — Z87442 Personal history of urinary calculi: Secondary | ICD-10-CM | POA: Diagnosis not present

## 2018-10-30 DIAGNOSIS — R040 Epistaxis: Secondary | ICD-10-CM | POA: Diagnosis not present

## 2018-10-30 DIAGNOSIS — I959 Hypotension, unspecified: Secondary | ICD-10-CM | POA: Diagnosis not present

## 2018-10-30 DIAGNOSIS — Z79899 Other long term (current) drug therapy: Secondary | ICD-10-CM | POA: Diagnosis not present

## 2018-10-30 DIAGNOSIS — Z9981 Dependence on supplemental oxygen: Secondary | ICD-10-CM | POA: Diagnosis not present

## 2018-10-30 DIAGNOSIS — A419 Sepsis, unspecified organism: Secondary | ICD-10-CM | POA: Diagnosis not present

## 2018-10-30 DIAGNOSIS — K219 Gastro-esophageal reflux disease without esophagitis: Secondary | ICD-10-CM | POA: Diagnosis present

## 2018-10-30 DIAGNOSIS — J9610 Chronic respiratory failure, unspecified whether with hypoxia or hypercapnia: Secondary | ICD-10-CM | POA: Diagnosis not present

## 2018-10-30 DIAGNOSIS — R55 Syncope and collapse: Secondary | ICD-10-CM | POA: Diagnosis present

## 2018-10-30 DIAGNOSIS — M1812 Unilateral primary osteoarthritis of first carpometacarpal joint, left hand: Secondary | ICD-10-CM | POA: Diagnosis not present

## 2018-10-30 DIAGNOSIS — N390 Urinary tract infection, site not specified: Secondary | ICD-10-CM | POA: Diagnosis present

## 2018-10-30 DIAGNOSIS — S199XXA Unspecified injury of neck, initial encounter: Secondary | ICD-10-CM | POA: Diagnosis not present

## 2018-10-31 DIAGNOSIS — I340 Nonrheumatic mitral (valve) insufficiency: Secondary | ICD-10-CM | POA: Diagnosis not present

## 2018-10-31 DIAGNOSIS — S0990XA Unspecified injury of head, initial encounter: Secondary | ICD-10-CM | POA: Diagnosis not present

## 2018-10-31 DIAGNOSIS — S199XXA Unspecified injury of neck, initial encounter: Secondary | ICD-10-CM | POA: Diagnosis not present

## 2018-10-31 DIAGNOSIS — N132 Hydronephrosis with renal and ureteral calculous obstruction: Secondary | ICD-10-CM | POA: Diagnosis not present

## 2018-10-31 DIAGNOSIS — N390 Urinary tract infection, site not specified: Secondary | ICD-10-CM | POA: Diagnosis not present

## 2018-10-31 DIAGNOSIS — A419 Sepsis, unspecified organism: Secondary | ICD-10-CM | POA: Diagnosis not present

## 2018-10-31 DIAGNOSIS — I350 Nonrheumatic aortic (valve) stenosis: Secondary | ICD-10-CM | POA: Diagnosis not present

## 2018-10-31 DIAGNOSIS — R6521 Severe sepsis with septic shock: Secondary | ICD-10-CM | POA: Diagnosis not present

## 2018-10-31 DIAGNOSIS — I361 Nonrheumatic tricuspid (valve) insufficiency: Secondary | ICD-10-CM

## 2018-11-01 DIAGNOSIS — R6521 Severe sepsis with septic shock: Secondary | ICD-10-CM | POA: Diagnosis not present

## 2018-11-01 DIAGNOSIS — N390 Urinary tract infection, site not specified: Secondary | ICD-10-CM | POA: Diagnosis not present

## 2018-11-01 DIAGNOSIS — N132 Hydronephrosis with renal and ureteral calculous obstruction: Secondary | ICD-10-CM | POA: Diagnosis not present

## 2018-11-01 DIAGNOSIS — A419 Sepsis, unspecified organism: Secondary | ICD-10-CM | POA: Diagnosis not present

## 2018-11-02 DIAGNOSIS — N132 Hydronephrosis with renal and ureteral calculous obstruction: Secondary | ICD-10-CM | POA: Diagnosis not present

## 2018-11-02 DIAGNOSIS — A419 Sepsis, unspecified organism: Secondary | ICD-10-CM | POA: Diagnosis not present

## 2018-11-02 DIAGNOSIS — R6521 Severe sepsis with septic shock: Secondary | ICD-10-CM | POA: Diagnosis not present

## 2018-11-02 DIAGNOSIS — N390 Urinary tract infection, site not specified: Secondary | ICD-10-CM | POA: Diagnosis not present

## 2018-11-03 DIAGNOSIS — R6521 Severe sepsis with septic shock: Secondary | ICD-10-CM | POA: Diagnosis not present

## 2018-11-03 DIAGNOSIS — N390 Urinary tract infection, site not specified: Secondary | ICD-10-CM | POA: Diagnosis not present

## 2018-11-03 DIAGNOSIS — N132 Hydronephrosis with renal and ureteral calculous obstruction: Secondary | ICD-10-CM | POA: Diagnosis not present

## 2018-11-03 DIAGNOSIS — A419 Sepsis, unspecified organism: Secondary | ICD-10-CM | POA: Diagnosis not present

## 2018-11-05 DIAGNOSIS — E538 Deficiency of other specified B group vitamins: Secondary | ICD-10-CM | POA: Diagnosis not present

## 2018-11-06 DIAGNOSIS — Z7901 Long term (current) use of anticoagulants: Secondary | ICD-10-CM | POA: Diagnosis not present

## 2018-11-06 DIAGNOSIS — N1330 Unspecified hydronephrosis: Secondary | ICD-10-CM | POA: Diagnosis not present

## 2018-11-06 DIAGNOSIS — I110 Hypertensive heart disease with heart failure: Secondary | ICD-10-CM | POA: Diagnosis not present

## 2018-11-06 DIAGNOSIS — Z9181 History of falling: Secondary | ICD-10-CM | POA: Diagnosis not present

## 2018-11-06 DIAGNOSIS — Z794 Long term (current) use of insulin: Secondary | ICD-10-CM | POA: Diagnosis not present

## 2018-11-06 DIAGNOSIS — Z7951 Long term (current) use of inhaled steroids: Secondary | ICD-10-CM | POA: Diagnosis not present

## 2018-11-06 DIAGNOSIS — N200 Calculus of kidney: Secondary | ICD-10-CM | POA: Diagnosis not present

## 2018-11-06 DIAGNOSIS — S022XXD Fracture of nasal bones, subsequent encounter for fracture with routine healing: Secondary | ICD-10-CM | POA: Diagnosis not present

## 2018-11-06 DIAGNOSIS — A419 Sepsis, unspecified organism: Secondary | ICD-10-CM | POA: Diagnosis not present

## 2018-11-06 DIAGNOSIS — E785 Hyperlipidemia, unspecified: Secondary | ICD-10-CM | POA: Diagnosis not present

## 2018-11-06 DIAGNOSIS — K219 Gastro-esophageal reflux disease without esophagitis: Secondary | ICD-10-CM | POA: Diagnosis not present

## 2018-11-06 DIAGNOSIS — I252 Old myocardial infarction: Secondary | ICD-10-CM | POA: Diagnosis not present

## 2018-11-06 DIAGNOSIS — N390 Urinary tract infection, site not specified: Secondary | ICD-10-CM | POA: Diagnosis not present

## 2018-11-06 DIAGNOSIS — J439 Emphysema, unspecified: Secondary | ICD-10-CM | POA: Diagnosis not present

## 2018-11-06 DIAGNOSIS — E039 Hypothyroidism, unspecified: Secondary | ICD-10-CM | POA: Diagnosis not present

## 2018-11-06 DIAGNOSIS — I5030 Unspecified diastolic (congestive) heart failure: Secondary | ICD-10-CM | POA: Diagnosis not present

## 2018-11-06 DIAGNOSIS — I2510 Atherosclerotic heart disease of native coronary artery without angina pectoris: Secondary | ICD-10-CM | POA: Diagnosis not present

## 2018-11-06 DIAGNOSIS — B965 Pseudomonas (aeruginosa) (mallei) (pseudomallei) as the cause of diseases classified elsewhere: Secondary | ICD-10-CM | POA: Diagnosis not present

## 2018-11-06 DIAGNOSIS — G8929 Other chronic pain: Secondary | ICD-10-CM | POA: Diagnosis not present

## 2018-11-06 DIAGNOSIS — Z9981 Dependence on supplemental oxygen: Secondary | ICD-10-CM | POA: Diagnosis not present

## 2018-11-06 DIAGNOSIS — D649 Anemia, unspecified: Secondary | ICD-10-CM | POA: Diagnosis not present

## 2018-11-06 DIAGNOSIS — I959 Hypotension, unspecified: Secondary | ICD-10-CM | POA: Diagnosis not present

## 2018-11-06 DIAGNOSIS — E119 Type 2 diabetes mellitus without complications: Secondary | ICD-10-CM | POA: Diagnosis not present

## 2018-11-07 DIAGNOSIS — S022XXD Fracture of nasal bones, subsequent encounter for fracture with routine healing: Secondary | ICD-10-CM | POA: Diagnosis not present

## 2018-11-07 DIAGNOSIS — B965 Pseudomonas (aeruginosa) (mallei) (pseudomallei) as the cause of diseases classified elsewhere: Secondary | ICD-10-CM | POA: Diagnosis not present

## 2018-11-07 DIAGNOSIS — Z794 Long term (current) use of insulin: Secondary | ICD-10-CM | POA: Diagnosis not present

## 2018-11-07 DIAGNOSIS — Z7951 Long term (current) use of inhaled steroids: Secondary | ICD-10-CM | POA: Diagnosis not present

## 2018-11-07 DIAGNOSIS — N390 Urinary tract infection, site not specified: Secondary | ICD-10-CM | POA: Diagnosis not present

## 2018-11-07 DIAGNOSIS — A419 Sepsis, unspecified organism: Secondary | ICD-10-CM | POA: Diagnosis not present

## 2018-11-09 DIAGNOSIS — G894 Chronic pain syndrome: Secondary | ICD-10-CM | POA: Diagnosis not present

## 2018-11-09 DIAGNOSIS — M47816 Spondylosis without myelopathy or radiculopathy, lumbar region: Secondary | ICD-10-CM | POA: Diagnosis not present

## 2018-11-09 DIAGNOSIS — Z1389 Encounter for screening for other disorder: Secondary | ICD-10-CM | POA: Diagnosis not present

## 2018-11-09 DIAGNOSIS — Z79891 Long term (current) use of opiate analgesic: Secondary | ICD-10-CM | POA: Diagnosis not present

## 2018-11-09 DIAGNOSIS — M5136 Other intervertebral disc degeneration, lumbar region: Secondary | ICD-10-CM | POA: Diagnosis not present

## 2018-11-10 DIAGNOSIS — Z7951 Long term (current) use of inhaled steroids: Secondary | ICD-10-CM | POA: Diagnosis not present

## 2018-11-10 DIAGNOSIS — A419 Sepsis, unspecified organism: Secondary | ICD-10-CM | POA: Diagnosis not present

## 2018-11-10 DIAGNOSIS — Z794 Long term (current) use of insulin: Secondary | ICD-10-CM | POA: Diagnosis not present

## 2018-11-10 DIAGNOSIS — S022XXD Fracture of nasal bones, subsequent encounter for fracture with routine healing: Secondary | ICD-10-CM | POA: Diagnosis not present

## 2018-11-10 DIAGNOSIS — N390 Urinary tract infection, site not specified: Secondary | ICD-10-CM | POA: Diagnosis not present

## 2018-11-10 DIAGNOSIS — B965 Pseudomonas (aeruginosa) (mallei) (pseudomallei) as the cause of diseases classified elsewhere: Secondary | ICD-10-CM | POA: Diagnosis not present

## 2018-11-11 DIAGNOSIS — N200 Calculus of kidney: Secondary | ICD-10-CM | POA: Diagnosis not present

## 2018-11-11 DIAGNOSIS — Z09 Encounter for follow-up examination after completed treatment for conditions other than malignant neoplasm: Secondary | ICD-10-CM | POA: Diagnosis not present

## 2018-11-11 DIAGNOSIS — N3281 Overactive bladder: Secondary | ICD-10-CM | POA: Diagnosis not present

## 2018-11-12 DIAGNOSIS — N390 Urinary tract infection, site not specified: Secondary | ICD-10-CM | POA: Diagnosis not present

## 2018-11-12 DIAGNOSIS — Z794 Long term (current) use of insulin: Secondary | ICD-10-CM | POA: Diagnosis not present

## 2018-11-12 DIAGNOSIS — S022XXD Fracture of nasal bones, subsequent encounter for fracture with routine healing: Secondary | ICD-10-CM | POA: Diagnosis not present

## 2018-11-12 DIAGNOSIS — B965 Pseudomonas (aeruginosa) (mallei) (pseudomallei) as the cause of diseases classified elsewhere: Secondary | ICD-10-CM | POA: Diagnosis not present

## 2018-11-12 DIAGNOSIS — A419 Sepsis, unspecified organism: Secondary | ICD-10-CM | POA: Diagnosis not present

## 2018-11-12 DIAGNOSIS — Z7951 Long term (current) use of inhaled steroids: Secondary | ICD-10-CM | POA: Diagnosis not present

## 2018-11-13 DIAGNOSIS — Z794 Long term (current) use of insulin: Secondary | ICD-10-CM | POA: Diagnosis not present

## 2018-11-13 DIAGNOSIS — N390 Urinary tract infection, site not specified: Secondary | ICD-10-CM | POA: Diagnosis not present

## 2018-11-13 DIAGNOSIS — A419 Sepsis, unspecified organism: Secondary | ICD-10-CM | POA: Diagnosis not present

## 2018-11-13 DIAGNOSIS — B965 Pseudomonas (aeruginosa) (mallei) (pseudomallei) as the cause of diseases classified elsewhere: Secondary | ICD-10-CM | POA: Diagnosis not present

## 2018-11-13 DIAGNOSIS — S022XXD Fracture of nasal bones, subsequent encounter for fracture with routine healing: Secondary | ICD-10-CM | POA: Diagnosis not present

## 2018-11-13 DIAGNOSIS — Z7951 Long term (current) use of inhaled steroids: Secondary | ICD-10-CM | POA: Diagnosis not present

## 2018-11-16 DIAGNOSIS — Z7951 Long term (current) use of inhaled steroids: Secondary | ICD-10-CM | POA: Diagnosis not present

## 2018-11-16 DIAGNOSIS — A419 Sepsis, unspecified organism: Secondary | ICD-10-CM | POA: Diagnosis not present

## 2018-11-16 DIAGNOSIS — B965 Pseudomonas (aeruginosa) (mallei) (pseudomallei) as the cause of diseases classified elsewhere: Secondary | ICD-10-CM | POA: Diagnosis not present

## 2018-11-16 DIAGNOSIS — Z794 Long term (current) use of insulin: Secondary | ICD-10-CM | POA: Diagnosis not present

## 2018-11-16 DIAGNOSIS — S022XXD Fracture of nasal bones, subsequent encounter for fracture with routine healing: Secondary | ICD-10-CM | POA: Diagnosis not present

## 2018-11-16 DIAGNOSIS — N390 Urinary tract infection, site not specified: Secondary | ICD-10-CM | POA: Diagnosis not present

## 2018-11-17 DIAGNOSIS — S6992XA Unspecified injury of left wrist, hand and finger(s), initial encounter: Secondary | ICD-10-CM | POA: Diagnosis not present

## 2018-11-17 DIAGNOSIS — S62327A Displaced fracture of shaft of fifth metacarpal bone, left hand, initial encounter for closed fracture: Secondary | ICD-10-CM | POA: Diagnosis not present

## 2018-11-17 DIAGNOSIS — M79642 Pain in left hand: Secondary | ICD-10-CM | POA: Diagnosis not present

## 2018-11-18 DIAGNOSIS — S62357A Nondisplaced fracture of shaft of fifth metacarpal bone, left hand, initial encounter for closed fracture: Secondary | ICD-10-CM | POA: Diagnosis not present

## 2018-11-20 DIAGNOSIS — N390 Urinary tract infection, site not specified: Secondary | ICD-10-CM | POA: Diagnosis not present

## 2018-11-20 DIAGNOSIS — B965 Pseudomonas (aeruginosa) (mallei) (pseudomallei) as the cause of diseases classified elsewhere: Secondary | ICD-10-CM | POA: Diagnosis not present

## 2018-11-20 DIAGNOSIS — S022XXD Fracture of nasal bones, subsequent encounter for fracture with routine healing: Secondary | ICD-10-CM | POA: Diagnosis not present

## 2018-11-20 DIAGNOSIS — Z794 Long term (current) use of insulin: Secondary | ICD-10-CM | POA: Diagnosis not present

## 2018-11-20 DIAGNOSIS — Z7951 Long term (current) use of inhaled steroids: Secondary | ICD-10-CM | POA: Diagnosis not present

## 2018-11-20 DIAGNOSIS — A419 Sepsis, unspecified organism: Secondary | ICD-10-CM | POA: Diagnosis not present

## 2018-11-23 ENCOUNTER — Ambulatory Visit: Payer: Medicare Other | Admitting: Cardiology

## 2018-11-23 DIAGNOSIS — R5383 Other fatigue: Secondary | ICD-10-CM | POA: Diagnosis not present

## 2018-11-23 DIAGNOSIS — Z23 Encounter for immunization: Secondary | ICD-10-CM | POA: Diagnosis not present

## 2018-11-23 DIAGNOSIS — J4520 Mild intermittent asthma, uncomplicated: Secondary | ICD-10-CM | POA: Diagnosis not present

## 2018-11-23 DIAGNOSIS — J310 Chronic rhinitis: Secondary | ICD-10-CM | POA: Diagnosis not present

## 2018-11-23 DIAGNOSIS — G4733 Obstructive sleep apnea (adult) (pediatric): Secondary | ICD-10-CM | POA: Diagnosis not present

## 2018-11-23 DIAGNOSIS — E559 Vitamin D deficiency, unspecified: Secondary | ICD-10-CM | POA: Diagnosis not present

## 2018-11-24 DIAGNOSIS — N390 Urinary tract infection, site not specified: Secondary | ICD-10-CM | POA: Diagnosis not present

## 2018-11-24 DIAGNOSIS — Z7951 Long term (current) use of inhaled steroids: Secondary | ICD-10-CM | POA: Diagnosis not present

## 2018-11-24 DIAGNOSIS — B965 Pseudomonas (aeruginosa) (mallei) (pseudomallei) as the cause of diseases classified elsewhere: Secondary | ICD-10-CM | POA: Diagnosis not present

## 2018-11-24 DIAGNOSIS — A419 Sepsis, unspecified organism: Secondary | ICD-10-CM | POA: Diagnosis not present

## 2018-11-24 DIAGNOSIS — Z794 Long term (current) use of insulin: Secondary | ICD-10-CM | POA: Diagnosis not present

## 2018-11-24 DIAGNOSIS — S022XXD Fracture of nasal bones, subsequent encounter for fracture with routine healing: Secondary | ICD-10-CM | POA: Diagnosis not present

## 2018-11-25 ENCOUNTER — Encounter: Payer: Self-pay | Admitting: Cardiology

## 2018-11-25 ENCOUNTER — Other Ambulatory Visit: Payer: Self-pay

## 2018-11-25 ENCOUNTER — Ambulatory Visit (INDEPENDENT_AMBULATORY_CARE_PROVIDER_SITE_OTHER): Payer: Medicare Other | Admitting: Cardiology

## 2018-11-25 VITALS — BP 90/70 | HR 71 | Ht 65.0 in | Wt 190.0 lb

## 2018-11-25 DIAGNOSIS — E119 Type 2 diabetes mellitus without complications: Secondary | ICD-10-CM

## 2018-11-25 DIAGNOSIS — I2510 Atherosclerotic heart disease of native coronary artery without angina pectoris: Secondary | ICD-10-CM

## 2018-11-25 DIAGNOSIS — I480 Paroxysmal atrial fibrillation: Secondary | ICD-10-CM

## 2018-11-25 NOTE — Patient Instructions (Signed)
Medication Instructions:  Your physician recommends that you continue on your current medications as directed. Please refer to the Current Medication list given to you today.  If you need a refill on your cardiac medications before your next appointment, please call your pharmacy.   Lab work: NONE If you have labs (blood work) drawn today and your tests are completely normal, you will receive your results only by: Marland Kitchen MyChart Message (if you have MyChart) OR . A paper copy in the mail If you have any lab test that is abnormal or we need to change your treatment, we will call you to review the results.  Testing/Procedures: You had an EKG performed today  Follow-Up: At [facility], you and your health needs are our priority.  As part of our continuing mission to provide you with exceptional heart care, we have created designated Provider Care Teams.  These Care Teams include your primary Cardiologist (physician) and Advanced Practice Providers (APPs -  Physician Assistants and Nurse Practitioners) who all work together to provide you with the care you need, when you need it. . You will need a follow up appointment in 1 months with Dr. Raliegh Ip

## 2018-11-25 NOTE — Progress Notes (Signed)
Cardiology Office Note:    Date:  11/25/2018   ID:  Shelia Sullivan, DOB [birth year], MRN [id number]  PCP:  [name], NP  Cardiologist:  [name], MD  Electrophysiologist:  None   Referring MD: [name], NP   Chief Complaint  Patient presents with  . Pre-op Exam    lithotripsy    History of Present Illness:    Shelia Sullivan is a 85 y.o. female with a hx of hypertension, diabetes type 2, CAD, COPD on oxygen with 2 L nasal cannula presents for a follow up.  The patient is here with no complaints but reports that she is going to be undergoing lithotripsy in a few weeks.  She denies any chest pain, shortness of breath, lightheadedness or dizziness.  She reports that she is able to do her chores at home and her energy level has not changed from her baseline.  Past Medical History:  Diagnosis Date  . Acute chest pain 06/08/2017   Last Assessment & Plan:  Consistent with angina pectoris brought on by atrial fibrillation with rapid ventricular response.  Nuclear scan intermediate risk for significant obstructive coronary artery disease.  Options discussed:  We will proceed with diagnostic cardiac catheterization but forego intervention on an ad Hoc basis as she does have history of a GI bleed has been told not to take aspiri  . Anemia   . Anxiety 06/08/2017  . Anxiety and depression   . Arthritis   . Asthma   . Asthma   . Atrophic vaginitis   . CAD (coronary artery disease)   . Cancer ([place])   . Chronic pain 06/08/2017  . Chronic respiratory failure ([place]) 06/08/2017  . CKD (chronic kidney disease)   . COPD (chronic obstructive pulmonary disease) ([place])   . Dyslipidemia 06/09/2017   Last Assessment & Plan:  Low HDL with LDL 76. Low threshold for continued statin therapy  . Esophageal reflux   . Fibrocystic breast changes of both breasts 06/27/2015  . History of GI bleed 06/08/2017   Last Assessment & Plan:  Stable at present with reasonable hemoglobin and hematocrit.   However, as noted above will defer consideration dual antiplatelet therapy at the present time due to need for  direct oral anticoagulant therapy given history of the atrial fibrillation pending review of the results from the diagnostic catheterization  . History of MI (myocardial infarction) 06/08/2017  . Hypercholesteremia   . Hypertension   . Hypothyroid   . Irritable bladder   . Kidney stone   . Morbid obesity ([place]) 06/27/2015  . Myocardial infarction ([place])   . Neuropathy   . Nocturia   . Osteoarthritis   . Peripheral vascular disease ([place]) 06/08/2017  . Rapid atrial fibrillation ([place]) 06/08/2017   Last Assessment & Plan:  Back in sinus rhythm at present but chads Vasc score is at least 4 based on age, gender, history of hypertension and diabetes.  Recommend anticoagulant therapy with a direct oral anticoagulant.  . Scars   . Stable angina pectoris ([place]) 06/08/2017   Added automatically from request for surgery [phone]  . Swelling   . Type 2 diabetes mellitus with stage 3 chronic kidney disease ([place])   . Type 2 diabetes mellitus, without long-term current use of insulin ([place]) 06/08/2017   Last Assessment & Plan:  Target A1c less than 7.0  . Vitamin D deficiency     Past Surgical History:  Procedure Laterality Date  . APPENDECTOMY  1950  .  ARTHROPLASTY Right 2002   Dr Reeves Forth  . Arthroscopy of shoulder    . BREAST SURGERY    . CARPAL TUNNEL RELEASE Left 2007  . CATARACT EXTRACTION Bilateral   . CHOLECYSTECTOMY  1980  . HERNIA REPAIR  1985  . LITHOTRIPSY  02/21/2017  . PARTIAL HYSTERECTOMY  1950  . REPLACEMENT TOTAL KNEE Left 02/03/2012  . TONSILLECTOMY  1940    Current Medications: Current Meds  Medication Sig  . ADVAIR DISKUS 250-50 MCG/DOSE AEPB Inhale 1 puff into the lungs 2 (two) times daily at 10 AM and 5 PM.   . albuterol (PROVENTIL HFA;VENTOLIN HFA) 108 (90 Base) MCG/ACT inhaler Inhale 1 puff into the lungs every 6 (six) hours as needed for wheezing or shortness of  breath.  . Calcium Carb-Cholecalciferol (CALCIUM 600+D3 PO) Take by mouth daily.  . carvedilol (COREG) 6.25 MG tablet Take 6.25 mg by mouth 2 (two) times daily.   . diclofenac sodium (VOLTAREN) 1 % GEL   . Dulaglutide (TRULICITY) 1.5 0000000 SOPN once a week.  . DULoxetine (CYMBALTA) 30 MG capsule Take 30 mg by mouth daily.  Marland Kitchen ELIQUIS 5 MG TABS tablet TAKE 1 TABLET TWICE A DAY  . Ergocalciferol (VITAMIN D2 PO) Take by mouth daily.  . fluticasone (FLONASE) 50 MCG/ACT nasal spray Place into the nose as needed.  . furosemide (LASIX) 20 MG tablet Take 20 mg by mouth daily.   Marland Kitchen gabapentin (NEURONTIN) 600 MG tablet Take 600 mg by mouth every 8 (eight) hours as needed.  Marland Kitchen HYDROmorphone (DILAUDID) 4 MG tablet TAKE 1 TABLET BY MOUTH EVERY 6 TO 8 HOURS AS NEEDED FOR CHRONIC PAIN (MAX 4 PER DAY)  . LEVEMIR FLEXTOUCH 100 UNIT/ML Pen Inject 50 Units into the skin daily.   Marland Kitchen levothyroxine (SYNTHROID) 50 MCG tablet Take 50 mcg by mouth daily.  Marland Kitchen lisinopril (PRINIVIL,ZESTRIL) 10 MG tablet Take 10 mg by mouth daily.  . Melatonin 5 MG TABS Take 10 mg by mouth at bedtime.  . metFORMIN (GLUCOPHAGE) 500 MG tablet 500 mg daily.  Marland Kitchen MOVANTIK 25 MG TABS tablet   . Multiple Vitamins-Minerals (PRESERVISION AREDS 2+MULTI VIT PO) Take by mouth.  Marland Kitchen omeprazole (PRILOSEC) 20 MG capsule Take 20 mg by mouth daily.   . pravastatin (PRAVACHOL) 80 MG tablet Take 80 mg by mouth at bedtime.   Marland Kitchen QUEtiapine (SEROQUEL) 200 MG tablet Take 200 mg by mouth daily.   . SURE COMFORT PEN NEEDLES 32G X 6 MM MISC      Allergies:   Codeine, Nsaids, and Tolmetin   Social History   Socioeconomic History  . Marital status: Widowed    Spouse name: Not on file  . Number of children: 4  . Years of education: Not on file  . Highest education level: Not on file  Occupational History  . Not on file  Social Needs  . Financial resource strain: Not on file  . Food insecurity    Worry: Not on file    Inability: Not on file  .  Transportation needs    Medical: Not on file    Non-medical: Not on file  Tobacco Use  . Smoking status: Former Smoker    Packs/day: 1.00    Years: 50.00    Pack years: 50.00    Quit date: 03/1998    Years since quitting: 20.6  . Smokeless tobacco: Never Used  Substance and Sexual Activity  . Alcohol use: No  . Drug use: No  . Sexual activity: Not on  file  Lifestyle  . Physical activity    Days per week: Not on file    Minutes per session: Not on file  . Stress: Not on file  Relationships  . Social Herbalist on phone: Not on file    Gets together: Not on file    Attends religious service: Not on file    Active member of club or organization: Not on file    Attends meetings of clubs or organizations: Not on file    Relationship status: Not on file  Other Topics Concern  . Not on file  Social History Narrative   Daughter and grandson live with patient.     Family History: The patient's family history includes Arthritis in her father; Breast cancer in her daughter; Colon cancer in her father; Diabetes in her sister; Heart disease in her father; Hyperlipidemia in her mother; Hypertension in her father and mother; Prostate cancer in her father; Stroke in her father.  ROS:   Review of Systems  Constitution: Negative for decreased appetite, fever and weight gain.  HENT: Negative for congestion, ear discharge, hoarse voice and sore throat.   Eyes: Negative for discharge, redness, vision loss in right eye and visual halos.  Cardiovascular: Negative for chest pain, dyspnea on exertion, leg swelling, orthopnea and palpitations.  Respiratory: Negative for cough, hemoptysis, shortness of breath and snoring.   Endocrine: Negative for heat intolerance and polyphagia.  Hematologic/Lymphatic: Negative for bleeding problem. Does not bruise/bleed easily.  Skin: Negative for flushing, nail changes, rash and suspicious lesions.  Musculoskeletal: Negative for arthritis, joint  pain, muscle cramps, myalgias, neck pain and stiffness.  Gastrointestinal: Negative for abdominal pain, bowel incontinence, diarrhea and excessive appetite.  Genitourinary: Negative for decreased libido, genital sores and incomplete emptying.  Neurological: Negative for brief paralysis, focal weakness, headaches and loss of balance.  Psychiatric/Behavioral: Negative for altered mental status, depression and suicidal ideas.  Allergic/Immunologic: Negative for HIV exposure and persistent infections.    EKGs/Labs/Other Studies Reviewed:    The following studies were reviewed today:   EKG:  The ekg ordered today demonstrates normal sinus rhythm, heart rate 71 bpm The EKG performed in February 2020.  Transthoracic echocardiogram report in on September 5,2020 reported mild concentric left ventricular hypertrophy.  There is normal global left ventricular contractility.  Left atrium is mildly dilated.  Mild aortic stenosis with no reported aortic regurgitation mild mitral vegetation.  Mild tricuspid vegetation.  Recent Labs: 04/08/2018: BUN 18; Creatinine, Ser 0.69; Hemoglobin 12.4; Platelets 183; Potassium 4.8; Sodium 146  Recent Lipid Panel    Component Value Date/Time   CHOL 151 09/02/2013 1300   TRIG 171 (H) 09/02/2013 1300   HDL 25 (L) 09/02/2013 1300   CHOLHDL 6.0 09/02/2013 1300   VLDL 34 09/02/2013 1300   LDLCALC 92 09/02/2013 1300    Physical Exam:    VS:  BP 90/70 (BP Location: Left Arm, Patient Position: Sitting, Cuff Size: Normal)   Pulse 71   Ht 5\' 5"  (1.651 m)   Wt 190 lb (86.2 kg)   SpO2 97%   BMI 31.62 kg/m     Wt Readings from Last 3 Encounters:  11/25/18 190 lb (86.2 kg)  04/08/18 195 lb 6.4 oz (88.6 kg)  01/06/18 205 lb 9.6 oz (93.3 kg)     GEN: Well nourished, well developed in no acute distress HEENT: Normal NECK: No JVD; No carotid bruits LYMPHATICS: No lymphadenopathy CARDIAC: S1S2 noted,RRR, no murmurs, rubs, gallops RESPIRATORY:  Clear  to  auscultation without rales, wheezing or rhonchi  ABDOMEN: Soft, non-tender, non-distended, +bowel sounds, no guarding. EXTREMITIES: No edema, No cyanosis, no clubbing MUSCULOSKELETAL:  No edema; No deformity  SKIN: Warm and dry NEUROLOGIC:  Alert and oriented x 3, non-focal PSYCHIATRIC:  Normal affect, good insight  ASSESSMENT:    1. Type 2 diabetes mellitus without complication, without long-term current use of insulin (HCC)   2. Paroxysmal atrial fibrillation ([place])   3. Coronary artery disease involving native coronary artery of native heart without angina pectoris    PLAN:    1. The patient does not have any unstable cardiac conditions.  Upon evaluation today, she can achieve 4 METs or greater without anginal symptoms.  According to [facility] and AHA guidelines, she requires no further cardiac workup prior to lithotripsy and should be at acceptable risk.  With this that she is on Eliquis 5 mg twice daily.  For her procedure she can hold Eliquis for 3 consecutive doses prior to her surgery. She should start her Eliquis as soon as possible at the discretion of the surgeon.  Aggressive hemodynamic monitoring during procedure is recommended and the patient should not stop her beta-blocker perioperatively.  In addition given her pulmonary condition with a chronic respiratory failure, I highly recommend she be evaluated by pulmonary prior to her procedure.  The patient is in agreement with the above plan. The patient left the office in stable condition.  The patient will follow up in 1 month.   Medication Adjustments/Labs and Tests Ordered: Current medicines are reviewed at length with the patient today.  Concerns regarding medicines are outlined above.  Orders Placed This Encounter  Procedures  . EKG 12-Lead   No orders of the defined types were placed in this encounter.   Patient Instructions  Medication Instructions:  Your physician recommends that you continue on your current medications  as directed. Please refer to the Current Medication list given to you today.  If you need a refill on your cardiac medications before your next appointment, please call your pharmacy.   Lab work: NONE If you have labs (blood work) drawn today and your tests are completely normal, you will receive your results only by: Marland Kitchen MyChart Message (if you have MyChart) OR . A paper copy in the mail If you have any lab test that is abnormal or we need to change your treatment, we will call you to review the results.  Testing/Procedures: You had an EKG performed today  Follow-Up: At [facility], you and your health needs are our priority.  As part of our continuing mission to provide you with exceptional heart care, we have created designated Provider Care Teams.  These Care Teams include your primary Cardiologist (physician) and Advanced Practice Providers (APPs -  Physician Assistants and Nurse Practitioners) who all work together to provide you with the care you need, when you need it. . You will need a follow up appointment in 1 months with Dr. Raliegh Ip         Adopting a Healthy Lifestyle.  Know what a healthy weight is for you (roughly BMI <25) and aim to maintain this   Aim for 7+ servings of fruits and vegetables daily   65-80+ fluid ounces of water or unsweet tea for healthy kidneys   Limit to max 1 drink of alcohol per day; avoid smoking/tobacco   Limit animal fats in diet for cholesterol and heart health - choose grass fed whenever available   Avoid highly processed  foods, and foods high in saturated/trans fats   Aim for low stress - take time to unwind and care for your mental health   Aim for 150 min of moderate intensity exercise weekly for heart health, and weights twice weekly for bone health   Aim for 7-9 hours of sleep daily   When it comes to diets, agreement about the perfect plan isnt easy to find, even among the experts. Experts at the [employer]  developed an idea known as the Healthy Eating Plate. Just imagine a plate divided into logical, healthy portions.   The emphasis is on diet quality:   Load up on vegetables and fruits - one-half of your plate: Aim for color and variety, and remember that potatoes dont count.   Go for whole grains - one-quarter of your plate: Whole wheat, barley, wheat berries, quinoa, oats, brown rice, and foods made with them. If you want pasta, go with whole wheat pasta.   Protein power - one-quarter of your plate: Fish, chicken, beans, and nuts are all healthy, versatile protein sources. Limit red meat.   The diet, however, does go beyond the plate, offering a few other suggestions.   Use healthy plant oils, such as olive, canola, soy, corn, sunflower and peanut. Check the labels, and avoid partially hydrogenated oil, which have unhealthy trans fats.   If youre thirsty, drink water. Coffee and tea are good in moderation, but skip sugary drinks and limit milk and dairy products to one or two daily servings.   The type of carbohydrate in the diet is more important than the amount. Some sources of carbohydrates, such as vegetables, fruits, whole grains, and beans-are healthier than others.   Finally, stay active  Signed, Berniece Salines, DO  11/25/2018 6:07 PM     [facility]

## 2018-11-26 DIAGNOSIS — A419 Sepsis, unspecified organism: Secondary | ICD-10-CM | POA: Diagnosis not present

## 2018-11-26 DIAGNOSIS — S022XXD Fracture of nasal bones, subsequent encounter for fracture with routine healing: Secondary | ICD-10-CM | POA: Diagnosis not present

## 2018-11-26 DIAGNOSIS — Z794 Long term (current) use of insulin: Secondary | ICD-10-CM | POA: Diagnosis not present

## 2018-11-26 DIAGNOSIS — Z7951 Long term (current) use of inhaled steroids: Secondary | ICD-10-CM | POA: Diagnosis not present

## 2018-11-26 DIAGNOSIS — B965 Pseudomonas (aeruginosa) (mallei) (pseudomallei) as the cause of diseases classified elsewhere: Secondary | ICD-10-CM | POA: Diagnosis not present

## 2018-11-26 DIAGNOSIS — N390 Urinary tract infection, site not specified: Secondary | ICD-10-CM | POA: Diagnosis not present

## 2018-12-01 ENCOUNTER — Telehealth: Payer: Self-pay | Admitting: Cardiology

## 2018-12-01 DIAGNOSIS — S022XXD Fracture of nasal bones, subsequent encounter for fracture with routine healing: Secondary | ICD-10-CM | POA: Diagnosis not present

## 2018-12-01 DIAGNOSIS — B965 Pseudomonas (aeruginosa) (mallei) (pseudomallei) as the cause of diseases classified elsewhere: Secondary | ICD-10-CM | POA: Diagnosis not present

## 2018-12-01 DIAGNOSIS — N390 Urinary tract infection, site not specified: Secondary | ICD-10-CM | POA: Diagnosis not present

## 2018-12-01 DIAGNOSIS — Z794 Long term (current) use of insulin: Secondary | ICD-10-CM | POA: Diagnosis not present

## 2018-12-01 DIAGNOSIS — A419 Sepsis, unspecified organism: Secondary | ICD-10-CM | POA: Diagnosis not present

## 2018-12-01 DIAGNOSIS — Z7951 Long term (current) use of inhaled steroids: Secondary | ICD-10-CM | POA: Diagnosis not present

## 2018-12-01 NOTE — Telephone Encounter (Signed)
Dr. Nila Nephew nurse called and was very abrupt about patients clearance form not being sent for her procedure on 10/13. Please can you make sure that this gets done asap per her request in order for her to biook surgery and get on the books.

## 2018-12-01 NOTE — Telephone Encounter (Signed)
Spoke with DR Nila Nephew nurse . Informed her that clearance has been fax'd and if not received to call me back at [phone].

## 2018-12-02 DIAGNOSIS — Z794 Long term (current) use of insulin: Secondary | ICD-10-CM | POA: Diagnosis not present

## 2018-12-02 DIAGNOSIS — S022XXD Fracture of nasal bones, subsequent encounter for fracture with routine healing: Secondary | ICD-10-CM | POA: Diagnosis not present

## 2018-12-02 DIAGNOSIS — N390 Urinary tract infection, site not specified: Secondary | ICD-10-CM | POA: Diagnosis not present

## 2018-12-02 DIAGNOSIS — Z7951 Long term (current) use of inhaled steroids: Secondary | ICD-10-CM | POA: Diagnosis not present

## 2018-12-02 DIAGNOSIS — B965 Pseudomonas (aeruginosa) (mallei) (pseudomallei) as the cause of diseases classified elsewhere: Secondary | ICD-10-CM | POA: Diagnosis not present

## 2018-12-02 DIAGNOSIS — A419 Sepsis, unspecified organism: Secondary | ICD-10-CM | POA: Diagnosis not present

## 2018-12-03 DIAGNOSIS — S022XXD Fracture of nasal bones, subsequent encounter for fracture with routine healing: Secondary | ICD-10-CM | POA: Diagnosis not present

## 2018-12-03 DIAGNOSIS — B965 Pseudomonas (aeruginosa) (mallei) (pseudomallei) as the cause of diseases classified elsewhere: Secondary | ICD-10-CM | POA: Diagnosis not present

## 2018-12-03 DIAGNOSIS — Z794 Long term (current) use of insulin: Secondary | ICD-10-CM | POA: Diagnosis not present

## 2018-12-03 DIAGNOSIS — Z7951 Long term (current) use of inhaled steroids: Secondary | ICD-10-CM | POA: Diagnosis not present

## 2018-12-03 DIAGNOSIS — A419 Sepsis, unspecified organism: Secondary | ICD-10-CM | POA: Diagnosis not present

## 2018-12-03 DIAGNOSIS — N390 Urinary tract infection, site not specified: Secondary | ICD-10-CM | POA: Diagnosis not present

## 2018-12-06 DIAGNOSIS — S022XXD Fracture of nasal bones, subsequent encounter for fracture with routine healing: Secondary | ICD-10-CM | POA: Diagnosis not present

## 2018-12-06 DIAGNOSIS — G8929 Other chronic pain: Secondary | ICD-10-CM | POA: Diagnosis not present

## 2018-12-06 DIAGNOSIS — Z9981 Dependence on supplemental oxygen: Secondary | ICD-10-CM | POA: Diagnosis not present

## 2018-12-06 DIAGNOSIS — J439 Emphysema, unspecified: Secondary | ICD-10-CM | POA: Diagnosis not present

## 2018-12-06 DIAGNOSIS — E785 Hyperlipidemia, unspecified: Secondary | ICD-10-CM | POA: Diagnosis not present

## 2018-12-06 DIAGNOSIS — Z9181 History of falling: Secondary | ICD-10-CM | POA: Diagnosis not present

## 2018-12-06 DIAGNOSIS — I959 Hypotension, unspecified: Secondary | ICD-10-CM | POA: Diagnosis not present

## 2018-12-06 DIAGNOSIS — N390 Urinary tract infection, site not specified: Secondary | ICD-10-CM | POA: Diagnosis not present

## 2018-12-06 DIAGNOSIS — I2510 Atherosclerotic heart disease of native coronary artery without angina pectoris: Secondary | ICD-10-CM | POA: Diagnosis not present

## 2018-12-06 DIAGNOSIS — N1330 Unspecified hydronephrosis: Secondary | ICD-10-CM | POA: Diagnosis not present

## 2018-12-06 DIAGNOSIS — A419 Sepsis, unspecified organism: Secondary | ICD-10-CM | POA: Diagnosis not present

## 2018-12-06 DIAGNOSIS — Z794 Long term (current) use of insulin: Secondary | ICD-10-CM | POA: Diagnosis not present

## 2018-12-06 DIAGNOSIS — I5030 Unspecified diastolic (congestive) heart failure: Secondary | ICD-10-CM | POA: Diagnosis not present

## 2018-12-06 DIAGNOSIS — K219 Gastro-esophageal reflux disease without esophagitis: Secondary | ICD-10-CM | POA: Diagnosis not present

## 2018-12-06 DIAGNOSIS — N200 Calculus of kidney: Secondary | ICD-10-CM | POA: Diagnosis not present

## 2018-12-06 DIAGNOSIS — B965 Pseudomonas (aeruginosa) (mallei) (pseudomallei) as the cause of diseases classified elsewhere: Secondary | ICD-10-CM | POA: Diagnosis not present

## 2018-12-06 DIAGNOSIS — Z7951 Long term (current) use of inhaled steroids: Secondary | ICD-10-CM | POA: Diagnosis not present

## 2018-12-06 DIAGNOSIS — I252 Old myocardial infarction: Secondary | ICD-10-CM | POA: Diagnosis not present

## 2018-12-06 DIAGNOSIS — E119 Type 2 diabetes mellitus without complications: Secondary | ICD-10-CM | POA: Diagnosis not present

## 2018-12-06 DIAGNOSIS — E039 Hypothyroidism, unspecified: Secondary | ICD-10-CM | POA: Diagnosis not present

## 2018-12-06 DIAGNOSIS — I110 Hypertensive heart disease with heart failure: Secondary | ICD-10-CM | POA: Diagnosis not present

## 2018-12-06 DIAGNOSIS — D649 Anemia, unspecified: Secondary | ICD-10-CM | POA: Diagnosis not present

## 2018-12-06 DIAGNOSIS — Z7901 Long term (current) use of anticoagulants: Secondary | ICD-10-CM | POA: Diagnosis not present

## 2018-12-07 DIAGNOSIS — E538 Deficiency of other specified B group vitamins: Secondary | ICD-10-CM | POA: Diagnosis not present

## 2018-12-07 DIAGNOSIS — E7800 Pure hypercholesterolemia, unspecified: Secondary | ICD-10-CM | POA: Diagnosis not present

## 2018-12-07 DIAGNOSIS — E039 Hypothyroidism, unspecified: Secondary | ICD-10-CM | POA: Diagnosis not present

## 2018-12-07 DIAGNOSIS — E1122 Type 2 diabetes mellitus with diabetic chronic kidney disease: Secondary | ICD-10-CM | POA: Diagnosis not present

## 2018-12-07 DIAGNOSIS — Z862 Personal history of diseases of the blood and blood-forming organs and certain disorders involving the immune mechanism: Secondary | ICD-10-CM | POA: Diagnosis not present

## 2018-12-08 DIAGNOSIS — I2510 Atherosclerotic heart disease of native coronary artery without angina pectoris: Secondary | ICD-10-CM | POA: Diagnosis not present

## 2018-12-08 DIAGNOSIS — N183 Chronic kidney disease, stage 3 unspecified: Secondary | ICD-10-CM | POA: Diagnosis not present

## 2018-12-08 DIAGNOSIS — Z79899 Other long term (current) drug therapy: Secondary | ICD-10-CM | POA: Diagnosis not present

## 2018-12-08 DIAGNOSIS — G629 Polyneuropathy, unspecified: Secondary | ICD-10-CM | POA: Diagnosis not present

## 2018-12-08 DIAGNOSIS — E039 Hypothyroidism, unspecified: Secondary | ICD-10-CM | POA: Diagnosis not present

## 2018-12-08 DIAGNOSIS — E1151 Type 2 diabetes mellitus with diabetic peripheral angiopathy without gangrene: Secondary | ICD-10-CM | POA: Diagnosis not present

## 2018-12-08 DIAGNOSIS — E669 Obesity, unspecified: Secondary | ICD-10-CM | POA: Diagnosis not present

## 2018-12-08 DIAGNOSIS — K219 Gastro-esophageal reflux disease without esophagitis: Secondary | ICD-10-CM | POA: Diagnosis not present

## 2018-12-08 DIAGNOSIS — N201 Calculus of ureter: Secondary | ICD-10-CM | POA: Diagnosis not present

## 2018-12-08 DIAGNOSIS — G4733 Obstructive sleep apnea (adult) (pediatric): Secondary | ICD-10-CM | POA: Diagnosis not present

## 2018-12-08 DIAGNOSIS — Z794 Long term (current) use of insulin: Secondary | ICD-10-CM | POA: Diagnosis not present

## 2018-12-08 DIAGNOSIS — J449 Chronic obstructive pulmonary disease, unspecified: Secondary | ICD-10-CM | POA: Diagnosis not present

## 2018-12-08 DIAGNOSIS — E785 Hyperlipidemia, unspecified: Secondary | ICD-10-CM | POA: Diagnosis not present

## 2018-12-08 DIAGNOSIS — Z7984 Long term (current) use of oral hypoglycemic drugs: Secondary | ICD-10-CM | POA: Diagnosis not present

## 2018-12-08 DIAGNOSIS — R310 Gross hematuria: Secondary | ICD-10-CM | POA: Diagnosis not present

## 2018-12-08 DIAGNOSIS — K5909 Other constipation: Secondary | ICD-10-CM | POA: Diagnosis not present

## 2018-12-08 DIAGNOSIS — E1122 Type 2 diabetes mellitus with diabetic chronic kidney disease: Secondary | ICD-10-CM | POA: Diagnosis not present

## 2018-12-08 DIAGNOSIS — I480 Paroxysmal atrial fibrillation: Secondary | ICD-10-CM | POA: Diagnosis not present

## 2018-12-08 DIAGNOSIS — G8929 Other chronic pain: Secondary | ICD-10-CM | POA: Diagnosis not present

## 2018-12-08 DIAGNOSIS — Z7901 Long term (current) use of anticoagulants: Secondary | ICD-10-CM | POA: Diagnosis not present

## 2018-12-08 DIAGNOSIS — M1990 Unspecified osteoarthritis, unspecified site: Secondary | ICD-10-CM | POA: Diagnosis not present

## 2018-12-09 DIAGNOSIS — S62357D Nondisplaced fracture of shaft of fifth metacarpal bone, left hand, subsequent encounter for fracture with routine healing: Secondary | ICD-10-CM | POA: Diagnosis not present

## 2018-12-11 DIAGNOSIS — Z79891 Long term (current) use of opiate analgesic: Secondary | ICD-10-CM | POA: Diagnosis not present

## 2018-12-11 DIAGNOSIS — G894 Chronic pain syndrome: Secondary | ICD-10-CM | POA: Diagnosis not present

## 2018-12-11 DIAGNOSIS — M5136 Other intervertebral disc degeneration, lumbar region: Secondary | ICD-10-CM | POA: Diagnosis not present

## 2018-12-11 DIAGNOSIS — Z1389 Encounter for screening for other disorder: Secondary | ICD-10-CM | POA: Diagnosis not present

## 2018-12-11 DIAGNOSIS — M47816 Spondylosis without myelopathy or radiculopathy, lumbar region: Secondary | ICD-10-CM | POA: Diagnosis not present

## 2018-12-15 DIAGNOSIS — N200 Calculus of kidney: Secondary | ICD-10-CM | POA: Diagnosis not present

## 2018-12-15 DIAGNOSIS — D649 Anemia, unspecified: Secondary | ICD-10-CM | POA: Diagnosis not present

## 2018-12-15 DIAGNOSIS — E875 Hyperkalemia: Secondary | ICD-10-CM | POA: Diagnosis not present

## 2018-12-22 DIAGNOSIS — E559 Vitamin D deficiency, unspecified: Secondary | ICD-10-CM | POA: Diagnosis not present

## 2018-12-22 DIAGNOSIS — R5383 Other fatigue: Secondary | ICD-10-CM | POA: Diagnosis not present

## 2018-12-22 DIAGNOSIS — G4733 Obstructive sleep apnea (adult) (pediatric): Secondary | ICD-10-CM | POA: Diagnosis not present

## 2018-12-22 DIAGNOSIS — J310 Chronic rhinitis: Secondary | ICD-10-CM | POA: Diagnosis not present

## 2018-12-22 DIAGNOSIS — J4520 Mild intermittent asthma, uncomplicated: Secondary | ICD-10-CM | POA: Diagnosis not present

## 2018-12-25 DIAGNOSIS — Z7951 Long term (current) use of inhaled steroids: Secondary | ICD-10-CM | POA: Diagnosis not present

## 2018-12-25 DIAGNOSIS — Z794 Long term (current) use of insulin: Secondary | ICD-10-CM | POA: Diagnosis not present

## 2018-12-25 DIAGNOSIS — S022XXD Fracture of nasal bones, subsequent encounter for fracture with routine healing: Secondary | ICD-10-CM | POA: Diagnosis not present

## 2018-12-25 DIAGNOSIS — B965 Pseudomonas (aeruginosa) (mallei) (pseudomallei) as the cause of diseases classified elsewhere: Secondary | ICD-10-CM | POA: Diagnosis not present

## 2018-12-25 DIAGNOSIS — N390 Urinary tract infection, site not specified: Secondary | ICD-10-CM | POA: Diagnosis not present

## 2018-12-25 DIAGNOSIS — A419 Sepsis, unspecified organism: Secondary | ICD-10-CM | POA: Diagnosis not present

## 2019-01-07 DIAGNOSIS — M5136 Other intervertebral disc degeneration, lumbar region: Secondary | ICD-10-CM | POA: Diagnosis not present

## 2019-01-07 DIAGNOSIS — Z1389 Encounter for screening for other disorder: Secondary | ICD-10-CM | POA: Diagnosis not present

## 2019-01-07 DIAGNOSIS — Z79891 Long term (current) use of opiate analgesic: Secondary | ICD-10-CM | POA: Diagnosis not present

## 2019-01-07 DIAGNOSIS — G894 Chronic pain syndrome: Secondary | ICD-10-CM | POA: Diagnosis not present

## 2019-01-07 DIAGNOSIS — M47816 Spondylosis without myelopathy or radiculopathy, lumbar region: Secondary | ICD-10-CM | POA: Diagnosis not present

## 2019-01-08 DIAGNOSIS — E538 Deficiency of other specified B group vitamins: Secondary | ICD-10-CM | POA: Diagnosis not present

## 2019-01-12 ENCOUNTER — Ambulatory Visit: Payer: Medicare Other | Admitting: Cardiology

## 2019-01-12 DIAGNOSIS — N390 Urinary tract infection, site not specified: Secondary | ICD-10-CM | POA: Diagnosis not present

## 2019-01-12 DIAGNOSIS — N200 Calculus of kidney: Secondary | ICD-10-CM | POA: Diagnosis not present

## 2019-01-12 DIAGNOSIS — N3289 Other specified disorders of bladder: Secondary | ICD-10-CM | POA: Diagnosis not present

## 2019-01-13 ENCOUNTER — Ambulatory Visit: Payer: Medicare Other | Admitting: Sports Medicine

## 2019-01-25 DIAGNOSIS — S62357D Nondisplaced fracture of shaft of fifth metacarpal bone, left hand, subsequent encounter for fracture with routine healing: Secondary | ICD-10-CM | POA: Diagnosis not present

## 2019-02-02 ENCOUNTER — Ambulatory Visit (INDEPENDENT_AMBULATORY_CARE_PROVIDER_SITE_OTHER): Payer: Medicare Other | Admitting: Cardiology

## 2019-02-02 ENCOUNTER — Other Ambulatory Visit: Payer: Self-pay

## 2019-02-02 ENCOUNTER — Encounter: Payer: Self-pay | Admitting: Cardiology

## 2019-02-02 VITALS — BP 122/60 | HR 63 | Ht 65.0 in | Wt 188.0 lb

## 2019-02-02 DIAGNOSIS — E785 Hyperlipidemia, unspecified: Secondary | ICD-10-CM | POA: Diagnosis not present

## 2019-02-02 DIAGNOSIS — I480 Paroxysmal atrial fibrillation: Secondary | ICD-10-CM

## 2019-02-02 DIAGNOSIS — Z8719 Personal history of other diseases of the digestive system: Secondary | ICD-10-CM

## 2019-02-02 DIAGNOSIS — E119 Type 2 diabetes mellitus without complications: Secondary | ICD-10-CM

## 2019-02-02 NOTE — Patient Instructions (Signed)
Medication Instructions:  Your physician recommends that you continue on your current medications as directed. Please refer to the Current Medication list given to you today.  *If you need a refill on your cardiac medications before your next appointment, please call your pharmacy*  Lab Work: None.  If you have labs (blood work) drawn today and your tests are completely normal, you will receive your results only by: . MyChart Message (if you have MyChart) OR . A paper copy in the mail If you have any lab test that is abnormal or we need to change your treatment, we will call you to review the results.  Testing/Procedures: Your physician has requested that you have an echocardiogram. Echocardiography is a painless test that uses sound waves to create images of your heart. It provides your doctor with information about the size and shape of your heart and how well your heart's chambers and valves are working. This procedure takes approximately one hour. There are no restrictions for this procedure.    Follow-Up: At [facility], you and your health needs are our priority.  As part of our continuing mission to provide you with exceptional heart care, we have created designated Provider Care Teams.  These Care Teams include your primary Cardiologist (physician) and Advanced Practice Providers (APPs -  Physician Assistants and Nurse Practitioners) who all work together to provide you with the care you need, when you need it.  Your next appointment:   6 months  The format for your next appointment:   In Person  Provider:   Robert Krasowski, MD  Other Instructions   Echocardiogram An echocardiogram is a procedure that uses painless sound waves (ultrasound) to produce an image of the heart. Images from an echocardiogram can provide important information about:  Signs of coronary artery disease (CAD).  Aneurysm detection. An aneurysm is a weak or damaged part of an artery wall that  bulges out from the normal force of blood pumping through the body.  Heart size and shape. Changes in the size or shape of the heart can be associated with certain conditions, including heart failure, aneurysm, and CAD.  Heart muscle function.  Heart valve function.  Signs of a past heart attack.  Fluid buildup around the heart.  Thickening of the heart muscle.  A tumor or infectious growth around the heart valves. Tell a health care provider about:  Any allergies you have.  All medicines you are taking, including vitamins, herbs, eye drops, creams, and over-the-counter medicines.  Any blood disorders you have.  Any surgeries you have had.  Any medical conditions you have.  Whether you are pregnant or may be pregnant. What are the risks? Generally, this is a safe procedure. However, problems may occur, including:  Allergic reaction to dye (contrast) that may be used during the procedure. What happens before the procedure? No specific preparation is needed. You may eat and drink normally. What happens during the procedure?   An IV tube may be inserted into one of your veins.  You may receive contrast through this tube. A contrast is an injection that improves the quality of the pictures from your heart.  A gel will be applied to your chest.  A wand-like tool (transducer) will be moved over your chest. The gel will help to transmit the sound waves from the transducer.  The sound waves will harmlessly bounce off of your heart to allow the heart images to be captured in real-time motion. The images will be recorded   on a computer. The procedure may vary among health care providers and hospitals. What happens after the procedure?  You may return to your normal, everyday life, including diet, activities, and medicines, unless your health care provider tells you not to do that. Summary  An echocardiogram is a procedure that uses painless sound waves (ultrasound) to produce  an image of the heart.  Images from an echocardiogram can provide important information about the size and shape of your heart, heart muscle function, heart valve function, and fluid buildup around your heart.  You do not need to do anything to prepare before this procedure. You may eat and drink normally.  After the echocardiogram is completed, you may return to your normal, everyday life, unless your health care provider tells you not to do that. This information is not intended to replace advice given to you by your health care provider. Make sure you discuss any questions you have with your health care provider. Document Released: 02/09/2000 Document Revised: 06/04/2018 Document Reviewed: 03/16/2016 Elsevier Patient Education  2020 [employer]   

## 2019-02-02 NOTE — Addendum Note (Signed)
Addended by: Ashok Norris on: 02/02/2019 02:24 PM   Modules accepted: Orders

## 2019-02-02 NOTE — Progress Notes (Signed)
Cardiology Office Note:    Date:  02/02/2019   ID:  Shelia Sullivan, DOB 1932/02/04, MRN [id number]  PCP:  Shelia Iba, NP  Cardiologist:  Shelia Campus, MD    Referring MD: Shelia Iba, NP   Chief Complaint  Patient presents with  . Follow-up  Doing well  History of Present Illness:    Shelia Sullivan is a 85 y.o. female with past medical history significant proximal mitral fibrillation, COPD, dyslipidemia, chronic pains, CAD.  Comes today to my office for follow-up overall doing well.  Denies having any chest pain, tightness, pressure, burning in the chest.  She did have lithotripsy done within last few months and did quite well with it.  Denies having any hematuria right now.  Past Medical History:  Diagnosis Date  . Acute chest pain 06/08/2017   Last Assessment & Plan:  Consistent with angina pectoris brought on by atrial fibrillation with rapid ventricular response.  Nuclear scan intermediate risk for significant obstructive coronary artery disease.  Options discussed:  We will proceed with diagnostic cardiac catheterization but forego intervention on an ad Hoc basis as she does have history of a GI bleed has been told not to take aspiri  . Anemia   . Anxiety 06/08/2017  . Anxiety and depression   . Arthritis   . Asthma   . Asthma   . Atrophic vaginitis   . CAD (coronary artery disease)   . Cancer ([place])   . Chronic pain 06/08/2017  . Chronic respiratory failure ([place]) 06/08/2017  . CKD (chronic kidney disease)   . COPD (chronic obstructive pulmonary disease) ([place])   . Dyslipidemia 06/09/2017   Last Assessment & Plan:  Low HDL with LDL 76. Low threshold for continued statin therapy  . Esophageal reflux   . Fibrocystic breast changes of both breasts 06/27/2015  . History of GI bleed 06/08/2017   Last Assessment & Plan:  Stable at present with reasonable hemoglobin and hematocrit.  However, as noted above will defer consideration dual antiplatelet therapy at the present  time due to need for  direct oral anticoagulant therapy given history of the atrial fibrillation pending review of the results from the diagnostic catheterization  . History of MI (myocardial infarction) 06/08/2017  . Hypercholesteremia   . Hypertension   . Hypothyroid   . Irritable bladder   . Kidney stone   . Morbid obesity ([place]) 06/27/2015  . Myocardial infarction ([place])   . Neuropathy   . Nocturia   . Osteoarthritis   . Peripheral vascular disease ([place]) 06/08/2017  . Rapid atrial fibrillation ([place]) 06/08/2017   Last Assessment & Plan:  Back in sinus rhythm at present but chads Vasc score is at least 4 based on age, gender, history of hypertension and diabetes.  Recommend anticoagulant therapy with a direct oral anticoagulant.  . Scars   . Stable angina pectoris ([place]) 06/08/2017   Added automatically from request for surgery [phone]  . Swelling   . Type 2 diabetes mellitus with stage 3 chronic kidney disease ([place])   . Type 2 diabetes mellitus, without long-term current use of insulin ([place]) 06/08/2017   Last Assessment & Plan:  Target A1c less than 7.0  . Vitamin D deficiency     Past Surgical History:  Procedure Laterality Date  . APPENDECTOMY  1950  . ARTHROPLASTY Right 2002   Dr Reeves Forth  . Arthroscopy of shoulder    . BREAST SURGERY    . CARPAL TUNNEL RELEASE Left 2007  .  CATARACT EXTRACTION Bilateral   . CHOLECYSTECTOMY  1980  . HERNIA REPAIR  1985  . LITHOTRIPSY  02/21/2017  . PARTIAL HYSTERECTOMY  1950  . REPLACEMENT TOTAL KNEE Left 02/03/2012  . TONSILLECTOMY  1940    Current Medications: Current Meds  Medication Sig  . ADVAIR DISKUS 250-50 MCG/DOSE AEPB Inhale 1 puff into the lungs 2 (two) times daily at 10 AM and 5 PM.   . albuterol (PROVENTIL HFA;VENTOLIN HFA) 108 (90 Base) MCG/ACT inhaler Inhale 1 puff into the lungs every 6 (six) hours as needed for wheezing or shortness of breath.  . Calcium Carb-Cholecalciferol (CALCIUM 600+D3 PO) Take by mouth daily.  .  carvedilol (COREG) 6.25 MG tablet Take 6.25 mg by mouth 2 (two) times daily.   . diclofenac sodium (VOLTAREN) 1 % GEL   . Dulaglutide (TRULICITY) 1.5 0000000 SOPN once a week.  . DULoxetine (CYMBALTA) 30 MG capsule Take 30 mg by mouth daily.  Marland Kitchen ELIQUIS 5 MG TABS tablet TAKE 1 TABLET TWICE A DAY  . Ergocalciferol (VITAMIN D2 PO) Take by mouth daily.  . fluticasone (FLONASE) 50 MCG/ACT nasal spray Place into the nose as needed.  . furosemide (LASIX) 20 MG tablet Take 20 mg by mouth daily.   Marland Kitchen gabapentin (NEURONTIN) 600 MG tablet Take 600 mg by mouth every 8 (eight) hours as needed.  Marland Kitchen HYDROmorphone (DILAUDID) 4 MG tablet TAKE 1 TABLET BY MOUTH EVERY 6 TO 8 HOURS AS NEEDED FOR CHRONIC PAIN (MAX 4 PER DAY)  . LEVEMIR FLEXTOUCH 100 UNIT/ML Pen Inject 50 Units into the skin daily.   Marland Kitchen levothyroxine (SYNTHROID) 50 MCG tablet Take 50 mcg by mouth daily.  Marland Kitchen lisinopril (PRINIVIL,ZESTRIL) 10 MG tablet Take 10 mg by mouth daily.  . Melatonin 5 MG TABS Take 10 mg by mouth at bedtime.  . metFORMIN (GLUCOPHAGE) 500 MG tablet 500 mg daily.  Marland Kitchen MOVANTIK 25 MG TABS tablet   . Multiple Vitamins-Minerals (PRESERVISION AREDS 2+MULTI VIT PO) Take by mouth.  Marland Kitchen omeprazole (PRILOSEC) 20 MG capsule Take 20 mg by mouth daily.   . pravastatin (PRAVACHOL) 80 MG tablet Take 80 mg by mouth at bedtime.   Marland Kitchen QUEtiapine (SEROQUEL) 200 MG tablet Take 200 mg by mouth daily.   . SURE COMFORT PEN NEEDLES 32G X 6 MM MISC      Allergies:   Codeine, Nsaids, and Tolmetin   Social History   Socioeconomic History  . Marital status: Widowed    Spouse name: Not on file  . Number of children: 4  . Years of education: Not on file  . Highest education level: Not on file  Occupational History  . Not on file  Social Needs  . Financial resource strain: Not on file  . Food insecurity    Worry: Not on file    Inability: Not on file  . Transportation needs    Medical: Not on file    Non-medical: Not on file  Tobacco Use  .  Smoking status: Former Smoker    Packs/day: 1.00    Years: 50.00    Pack years: 50.00    Quit date: 03/1998    Years since quitting: 20.8  . Smokeless tobacco: Never Used  Substance and Sexual Activity  . Alcohol use: No  . Drug use: No  . Sexual activity: Not on file  Lifestyle  . Physical activity    Days per week: Not on file    Minutes per session: Not on file  . Stress: Not  on file  Relationships  . Social Herbalist on phone: Not on file    Gets together: Not on file    Attends religious service: Not on file    Active member of club or organization: Not on file    Attends meetings of clubs or organizations: Not on file    Relationship status: Not on file  Other Topics Concern  . Not on file  Social History Narrative   Daughter and grandson live with patient.     Family History: The patient's family history includes Arthritis in her father; Breast cancer in her daughter; Colon cancer in her father; Diabetes in her sister; Heart disease in her father; Hyperlipidemia in her mother; Hypertension in her father and mother; Prostate cancer in her father; Stroke in her father. ROS:   Please see the history of present illness.    All 14 point review of systems negative except as described per history of present illness  EKGs/Labs/Other Studies Reviewed:      Recent Labs: 04/08/2018: BUN 18; Creatinine, Ser 0.69; Hemoglobin 12.4; Platelets 183; Potassium 4.8; Sodium 146  Recent Lipid Panel    Component Value Date/Time   CHOL 151 09/02/2013 1300   TRIG 171 (H) 09/02/2013 1300   HDL 25 (L) 09/02/2013 1300   CHOLHDL 6.0 09/02/2013 1300   VLDL 34 09/02/2013 1300   LDLCALC 92 09/02/2013 1300    Physical Exam:    VS:  BP 122/60   Pulse 63   Ht 5\' 5"  (1.651 m)   Wt 188 lb (85.3 kg)   SpO2 (!) 88%   BMI 31.28 kg/m     Wt Readings from Last 3 Encounters:  02/02/19 188 lb (85.3 kg)  11/25/18 190 lb (86.2 kg)  04/08/18 195 lb 6.4 oz (88.6 kg)     GEN:   Well nourished, well developed in no acute distress HEENT: Normal NECK: No JVD; No carotid bruits LYMPHATICS: No lymphadenopathy CARDIAC: RRR, systolic murmur best heard at left border of the sternum 1-2 over 6, no rubs, no gallops RESPIRATORY:  Clear to auscultation without rales, wheezing or rhonchi  ABDOMEN: Soft, non-tender, non-distended MUSCULOSKELETAL:  No edema; No deformity  SKIN: Warm and dry LOWER EXTREMITIES: no swelling NEUROLOGIC:  Alert and oriented x 3 PSYCHIATRIC:  Normal affect   ASSESSMENT:    1. Paroxysmal atrial fibrillation (HCC)   2. Type 2 diabetes mellitus without complication, without long-term current use of insulin ([place])   3. History of GI bleed   4. Dyslipidemia    PLAN:    In order of problems listed above:  1. Paroxysmal atrial fibrillation anticoagulated with Eliquis which I will continue.  She is still on 5 mg of Eliquis twice daily.  She is 85 years old but she weighs more than 60 kg, and also her creatinine is less than 1.  Continue with present dose.  Denies having any episode of palpitations. 2. Type 2 diabetes.  Followed by internal medicine team last hemoglobin A1c 5.5.  This is from September of this year. 3. History of GI bleed does not have at this problem anymore. Dyslipidemia: Last LDL I see from summertime is 84 with HDL 30.  She is on Pravachol 80 and thus the only medication she can tolerate.  We will continue 5.  Systolic heart murmur.  Echocardiogram will be done to assess the lesion was supposed to do in February because of coronavirus it did not happen.   Medication Adjustments/Labs and  Tests Ordered: Current medicines are reviewed at length with the patient today.  Concerns regarding medicines are outlined above.  No orders of the defined types were placed in this encounter.  Medication changes: No orders of the defined types were placed in this encounter.   Signed, Park Liter, MD, [facility] 02/02/2019 2:07 PM    [place]

## 2019-02-08 DIAGNOSIS — Z1389 Encounter for screening for other disorder: Secondary | ICD-10-CM | POA: Diagnosis not present

## 2019-02-08 DIAGNOSIS — Z79891 Long term (current) use of opiate analgesic: Secondary | ICD-10-CM | POA: Diagnosis not present

## 2019-02-08 DIAGNOSIS — M47816 Spondylosis without myelopathy or radiculopathy, lumbar region: Secondary | ICD-10-CM | POA: Diagnosis not present

## 2019-02-08 DIAGNOSIS — M5136 Other intervertebral disc degeneration, lumbar region: Secondary | ICD-10-CM | POA: Diagnosis not present

## 2019-02-08 DIAGNOSIS — G894 Chronic pain syndrome: Secondary | ICD-10-CM | POA: Diagnosis not present

## 2019-02-08 DIAGNOSIS — E538 Deficiency of other specified B group vitamins: Secondary | ICD-10-CM | POA: Diagnosis not present

## 2019-02-10 ENCOUNTER — Other Ambulatory Visit: Payer: Self-pay

## 2019-02-10 ENCOUNTER — Ambulatory Visit (INDEPENDENT_AMBULATORY_CARE_PROVIDER_SITE_OTHER): Payer: Medicare Other | Admitting: Sports Medicine

## 2019-02-10 ENCOUNTER — Encounter: Payer: Self-pay | Admitting: Sports Medicine

## 2019-02-10 DIAGNOSIS — I739 Peripheral vascular disease, unspecified: Secondary | ICD-10-CM

## 2019-02-10 DIAGNOSIS — M79674 Pain in right toe(s): Secondary | ICD-10-CM | POA: Diagnosis not present

## 2019-02-10 DIAGNOSIS — B351 Tinea unguium: Secondary | ICD-10-CM | POA: Diagnosis not present

## 2019-02-10 DIAGNOSIS — L84 Corns and callosities: Secondary | ICD-10-CM

## 2019-02-10 DIAGNOSIS — M2040 Other hammer toe(s) (acquired), unspecified foot: Secondary | ICD-10-CM

## 2019-02-10 DIAGNOSIS — E1140 Type 2 diabetes mellitus with diabetic neuropathy, unspecified: Secondary | ICD-10-CM

## 2019-02-10 DIAGNOSIS — M79675 Pain in left toe(s): Secondary | ICD-10-CM

## 2019-02-10 NOTE — Progress Notes (Signed)
Subjective: Shelia Sullivan is a 85 y.o. female patient with history of diabetes who presents to office today complaining of long, painful nails and callus while ambulating in shoes; unable to trim. Patient states that the glucose reading was 115, few days ago, Last saw Dr Leroy Sea 1 month  ago and last A1c less than 7.  Reports that she had a kidney stone in September and had to miss that nail trim appointment. Patient denies any other new issues at this time.  Patient Active Problem List   Diagnosis Date Noted  . Hematuria 01/06/2018  . Paroxysmal atrial fibrillation ([place]) 09/30/2017  . Dyslipidemia 06/09/2017  . Type 2 diabetes mellitus, without long-term current use of insulin ([place]) 06/08/2017  . Stable angina pectoris ([place]) 06/08/2017  . Rapid atrial fibrillation ([place]) 06/08/2017  . Peripheral vascular disease ([place]) 06/08/2017  . History of GI bleed 06/08/2017  . History of MI (myocardial infarction) 06/08/2017  . Kidney stone 06/08/2017  . Chronic respiratory failure ([place]) 06/08/2017  . Chronic pain 06/08/2017  . Asthma 06/08/2017  . Anxiety 06/08/2017  . Acute chest pain 06/08/2017  . Morbid obesity ([place]) 06/27/2015  . Fibrocystic breast changes of both breasts 06/27/2015   Current Outpatient Medications on File Prior to Visit  Medication Sig Dispense Refill  . ADVAIR DISKUS 250-50 MCG/DOSE AEPB Inhale 1 puff into the lungs 2 (two) times daily at 10 AM and 5 PM.     . albuterol (PROVENTIL HFA;VENTOLIN HFA) 108 (90 Base) MCG/ACT inhaler Inhale 1 puff into the lungs every 6 (six) hours as needed for wheezing or shortness of breath.    . Calcium Carb-Cholecalciferol (CALCIUM 600+D3 PO) Take by mouth daily.    . carvedilol (COREG) 6.25 MG tablet Take 6.25 mg by mouth 2 (two) times daily.     . diclofenac sodium (VOLTAREN) 1 % GEL     . Dulaglutide (TRULICITY) 1.5 0000000 SOPN once a week.    . DULoxetine (CYMBALTA) 30 MG capsule Take 30 mg by mouth daily.    Marland Kitchen ELIQUIS 5 MG TABS  tablet TAKE 1 TABLET TWICE A DAY 180 tablet 3  . Ergocalciferol (VITAMIN D2 PO) Take by mouth daily.    . fluticasone (FLONASE) 50 MCG/ACT nasal spray Place into the nose as needed.    . furosemide (LASIX) 20 MG tablet Take 20 mg by mouth daily.     Marland Kitchen gabapentin (NEURONTIN) 600 MG tablet Take 600 mg by mouth every 8 (eight) hours as needed.    Marland Kitchen HYDROmorphone (DILAUDID) 4 MG tablet TAKE 1 TABLET BY MOUTH EVERY 6 TO 8 HOURS AS NEEDED FOR CHRONIC PAIN (MAX 4 PER DAY)  0  . LEVEMIR FLEXTOUCH 100 UNIT/ML Pen Inject 50 Units into the skin daily.     Marland Kitchen levothyroxine (SYNTHROID) 50 MCG tablet Take 50 mcg by mouth daily.    Marland Kitchen lisinopril (PRINIVIL,ZESTRIL) 10 MG tablet Take 10 mg by mouth daily.    . Melatonin 5 MG TABS Take 10 mg by mouth at bedtime.    . metFORMIN (GLUCOPHAGE) 500 MG tablet 500 mg daily.    Marland Kitchen MOVANTIK 25 MG TABS tablet     . Multiple Vitamins-Minerals (PRESERVISION AREDS 2+MULTI VIT PO) Take by mouth.    Marland Kitchen omeprazole (PRILOSEC) 20 MG capsule Take 20 mg by mouth daily.     . pravastatin (PRAVACHOL) 80 MG tablet Take 80 mg by mouth at bedtime.     Marland Kitchen QUEtiapine (SEROQUEL) 200 MG tablet Take 200 mg by mouth  daily.     Haig Prophet COMFORT PEN NEEDLES 32G X 6 MM MISC      No current facility-administered medications on file prior to visit.   Allergies  Allergen Reactions  . Codeine   . Nsaids Other (See Comments)    GI Bleeding  . Tolmetin Other (See Comments)    GI Bleeding    No results found for this or any previous visit (from the past 2160 hour(s)).  Objective: General: Patient is awake, alert, and oriented x 3 and in no acute distress.  Integument: Skin is dry and supple bilateral. Nails are tender, long, thickened and dystrophic with subungual debris, consistent with onychomycosis, 2-5 bilateral.  There is no hallux nails bilateral from previous removal. No signs of infection. + callus at tips of 3rd toes bilateral. Remaining integument unremarkable.  Vasculature:  Dorsalis  Pedis pulse 1/4 bilateral. Posterior Tibial pulse  0/4 bilateral. Capillary fill time <5 sec 1-5 bilateral. Scant hair growth to the level of the digits.Temperature gradient mildly decreased bilateral. + varicosities present bilateral. Trace edema present bilateral, L>R chronic.   Neurology: The patient has absent sensation measured with a 5.07/10g Semmes Weinstein Monofilament at all pedal sites bilateral . Vibratory sensation diminished bilateral with tuning fork. No Babinski sign present bilateral.   Musculoskeletal: Asymptomatic hammertoe and fat pad atrophy with planus pedal deformities noted bilateral. Muscular strength 5/5 in all lower extremity muscular groups bilateral without pain on range of motion however stiffness likely arthritis present. No tenderness with calf compression bilateral.  Assessment and Plan: Problem List Items Addressed This Visit    None    Visit Diagnoses    Pain due to onychomycosis of toenails of both feet    -  Primary   Callus of foot       Hammer toe, unspecified laterality       Type 2 diabetes, controlled, with neuropathy (HCC)       PVD (peripheral vascular disease) ([place])         -Examined patient. -Re-Discussed and educated patient on diabetic foot care, especially with  regards to the vascular, neurological and musculoskeletal systems.  -Mechanically debrided callus x 2 using rotary bur at no charge without incident -Mechanically debrided  all nails 2-5 bilateral using sterile nail nipper and filed with dremel without incident  -Patient to return  in 3 months for at risk foot care -Patient advised to call the office if any problems or questions arise in the meantime.  Landis Martins, DPM

## 2019-03-02 DIAGNOSIS — R0789 Other chest pain: Secondary | ICD-10-CM | POA: Diagnosis not present

## 2019-03-02 DIAGNOSIS — E039 Hypothyroidism, unspecified: Secondary | ICD-10-CM | POA: Diagnosis not present

## 2019-03-02 DIAGNOSIS — I249 Acute ischemic heart disease, unspecified: Secondary | ICD-10-CM | POA: Diagnosis not present

## 2019-03-02 DIAGNOSIS — R079 Chest pain, unspecified: Secondary | ICD-10-CM | POA: Diagnosis not present

## 2019-03-02 DIAGNOSIS — Z03818 Encounter for observation for suspected exposure to other biological agents ruled out: Secondary | ICD-10-CM | POA: Diagnosis not present

## 2019-03-02 DIAGNOSIS — E785 Hyperlipidemia, unspecified: Secondary | ICD-10-CM | POA: Diagnosis not present

## 2019-03-02 DIAGNOSIS — E119 Type 2 diabetes mellitus without complications: Secondary | ICD-10-CM | POA: Diagnosis not present

## 2019-03-03 DIAGNOSIS — R9439 Abnormal result of other cardiovascular function study: Secondary | ICD-10-CM | POA: Diagnosis not present

## 2019-03-03 DIAGNOSIS — R079 Chest pain, unspecified: Secondary | ICD-10-CM | POA: Diagnosis not present

## 2019-03-03 DIAGNOSIS — I480 Paroxysmal atrial fibrillation: Secondary | ICD-10-CM | POA: Diagnosis not present

## 2019-03-03 DIAGNOSIS — E039 Hypothyroidism, unspecified: Secondary | ICD-10-CM | POA: Diagnosis not present

## 2019-03-03 DIAGNOSIS — R0789 Other chest pain: Secondary | ICD-10-CM | POA: Diagnosis not present

## 2019-03-03 DIAGNOSIS — E785 Hyperlipidemia, unspecified: Secondary | ICD-10-CM | POA: Diagnosis not present

## 2019-03-03 DIAGNOSIS — E119 Type 2 diabetes mellitus without complications: Secondary | ICD-10-CM | POA: Diagnosis not present

## 2019-03-03 DIAGNOSIS — J441 Chronic obstructive pulmonary disease with (acute) exacerbation: Secondary | ICD-10-CM | POA: Diagnosis not present

## 2019-03-04 DIAGNOSIS — E039 Hypothyroidism, unspecified: Secondary | ICD-10-CM | POA: Diagnosis not present

## 2019-03-04 DIAGNOSIS — I480 Paroxysmal atrial fibrillation: Secondary | ICD-10-CM | POA: Diagnosis not present

## 2019-03-04 DIAGNOSIS — R9439 Abnormal result of other cardiovascular function study: Secondary | ICD-10-CM | POA: Diagnosis not present

## 2019-03-04 DIAGNOSIS — E785 Hyperlipidemia, unspecified: Secondary | ICD-10-CM | POA: Diagnosis not present

## 2019-03-04 DIAGNOSIS — E119 Type 2 diabetes mellitus without complications: Secondary | ICD-10-CM | POA: Diagnosis not present

## 2019-03-04 DIAGNOSIS — J441 Chronic obstructive pulmonary disease with (acute) exacerbation: Secondary | ICD-10-CM | POA: Diagnosis not present

## 2019-03-04 DIAGNOSIS — R079 Chest pain, unspecified: Secondary | ICD-10-CM | POA: Diagnosis not present

## 2019-03-04 DIAGNOSIS — R0789 Other chest pain: Secondary | ICD-10-CM | POA: Diagnosis not present

## 2019-03-05 DIAGNOSIS — I249 Acute ischemic heart disease, unspecified: Secondary | ICD-10-CM | POA: Diagnosis not present

## 2019-03-05 DIAGNOSIS — F418 Other specified anxiety disorders: Secondary | ICD-10-CM | POA: Diagnosis not present

## 2019-03-05 DIAGNOSIS — I252 Old myocardial infarction: Secondary | ICD-10-CM | POA: Diagnosis not present

## 2019-03-05 DIAGNOSIS — Z87891 Personal history of nicotine dependence: Secondary | ICD-10-CM | POA: Diagnosis not present

## 2019-03-05 DIAGNOSIS — I480 Paroxysmal atrial fibrillation: Secondary | ICD-10-CM | POA: Diagnosis not present

## 2019-03-05 DIAGNOSIS — E039 Hypothyroidism, unspecified: Secondary | ICD-10-CM | POA: Diagnosis not present

## 2019-03-05 DIAGNOSIS — Z794 Long term (current) use of insulin: Secondary | ICD-10-CM | POA: Diagnosis not present

## 2019-03-05 DIAGNOSIS — Z7951 Long term (current) use of inhaled steroids: Secondary | ICD-10-CM | POA: Diagnosis not present

## 2019-03-05 DIAGNOSIS — E7800 Pure hypercholesterolemia, unspecified: Secondary | ICD-10-CM | POA: Diagnosis not present

## 2019-03-05 DIAGNOSIS — I2510 Atherosclerotic heart disease of native coronary artery without angina pectoris: Secondary | ICD-10-CM | POA: Diagnosis not present

## 2019-03-05 DIAGNOSIS — Z7901 Long term (current) use of anticoagulants: Secondary | ICD-10-CM | POA: Diagnosis not present

## 2019-03-05 DIAGNOSIS — K219 Gastro-esophageal reflux disease without esophagitis: Secondary | ICD-10-CM | POA: Diagnosis not present

## 2019-03-05 DIAGNOSIS — N183 Chronic kidney disease, stage 3 unspecified: Secondary | ICD-10-CM | POA: Diagnosis not present

## 2019-03-05 DIAGNOSIS — E1122 Type 2 diabetes mellitus with diabetic chronic kidney disease: Secondary | ICD-10-CM | POA: Diagnosis not present

## 2019-03-05 DIAGNOSIS — E1151 Type 2 diabetes mellitus with diabetic peripheral angiopathy without gangrene: Secondary | ICD-10-CM | POA: Diagnosis not present

## 2019-03-05 DIAGNOSIS — E1165 Type 2 diabetes mellitus with hyperglycemia: Secondary | ICD-10-CM | POA: Diagnosis not present

## 2019-03-05 DIAGNOSIS — Z9981 Dependence on supplemental oxygen: Secondary | ICD-10-CM | POA: Diagnosis not present

## 2019-03-05 DIAGNOSIS — J439 Emphysema, unspecified: Secondary | ICD-10-CM | POA: Diagnosis not present

## 2019-03-05 DIAGNOSIS — Z9181 History of falling: Secondary | ICD-10-CM | POA: Diagnosis not present

## 2019-03-05 DIAGNOSIS — I129 Hypertensive chronic kidney disease with stage 1 through stage 4 chronic kidney disease, or unspecified chronic kidney disease: Secondary | ICD-10-CM | POA: Diagnosis not present

## 2019-03-08 DIAGNOSIS — J439 Emphysema, unspecified: Secondary | ICD-10-CM | POA: Diagnosis not present

## 2019-03-08 DIAGNOSIS — I129 Hypertensive chronic kidney disease with stage 1 through stage 4 chronic kidney disease, or unspecified chronic kidney disease: Secondary | ICD-10-CM | POA: Diagnosis not present

## 2019-03-08 DIAGNOSIS — N183 Chronic kidney disease, stage 3 unspecified: Secondary | ICD-10-CM | POA: Diagnosis not present

## 2019-03-08 DIAGNOSIS — F418 Other specified anxiety disorders: Secondary | ICD-10-CM | POA: Diagnosis not present

## 2019-03-08 DIAGNOSIS — I249 Acute ischemic heart disease, unspecified: Secondary | ICD-10-CM | POA: Diagnosis not present

## 2019-03-08 DIAGNOSIS — E1151 Type 2 diabetes mellitus with diabetic peripheral angiopathy without gangrene: Secondary | ICD-10-CM | POA: Diagnosis not present

## 2019-03-10 DIAGNOSIS — E039 Hypothyroidism, unspecified: Secondary | ICD-10-CM | POA: Diagnosis not present

## 2019-03-10 DIAGNOSIS — R079 Chest pain, unspecified: Secondary | ICD-10-CM | POA: Diagnosis not present

## 2019-03-11 DIAGNOSIS — F418 Other specified anxiety disorders: Secondary | ICD-10-CM | POA: Diagnosis not present

## 2019-03-11 DIAGNOSIS — N183 Chronic kidney disease, stage 3 unspecified: Secondary | ICD-10-CM | POA: Diagnosis not present

## 2019-03-11 DIAGNOSIS — E1151 Type 2 diabetes mellitus with diabetic peripheral angiopathy without gangrene: Secondary | ICD-10-CM | POA: Diagnosis not present

## 2019-03-11 DIAGNOSIS — I249 Acute ischemic heart disease, unspecified: Secondary | ICD-10-CM | POA: Diagnosis not present

## 2019-03-11 DIAGNOSIS — J439 Emphysema, unspecified: Secondary | ICD-10-CM | POA: Diagnosis not present

## 2019-03-11 DIAGNOSIS — I129 Hypertensive chronic kidney disease with stage 1 through stage 4 chronic kidney disease, or unspecified chronic kidney disease: Secondary | ICD-10-CM | POA: Diagnosis not present

## 2019-03-12 DIAGNOSIS — N183 Chronic kidney disease, stage 3 unspecified: Secondary | ICD-10-CM | POA: Diagnosis not present

## 2019-03-12 DIAGNOSIS — J439 Emphysema, unspecified: Secondary | ICD-10-CM | POA: Diagnosis not present

## 2019-03-12 DIAGNOSIS — E1151 Type 2 diabetes mellitus with diabetic peripheral angiopathy without gangrene: Secondary | ICD-10-CM | POA: Diagnosis not present

## 2019-03-12 DIAGNOSIS — I129 Hypertensive chronic kidney disease with stage 1 through stage 4 chronic kidney disease, or unspecified chronic kidney disease: Secondary | ICD-10-CM | POA: Diagnosis not present

## 2019-03-12 DIAGNOSIS — I249 Acute ischemic heart disease, unspecified: Secondary | ICD-10-CM | POA: Diagnosis not present

## 2019-03-12 DIAGNOSIS — F418 Other specified anxiety disorders: Secondary | ICD-10-CM | POA: Diagnosis not present

## 2019-03-15 DIAGNOSIS — E538 Deficiency of other specified B group vitamins: Secondary | ICD-10-CM | POA: Diagnosis not present

## 2019-03-17 DIAGNOSIS — J439 Emphysema, unspecified: Secondary | ICD-10-CM | POA: Diagnosis not present

## 2019-03-17 DIAGNOSIS — E1151 Type 2 diabetes mellitus with diabetic peripheral angiopathy without gangrene: Secondary | ICD-10-CM | POA: Diagnosis not present

## 2019-03-17 DIAGNOSIS — N183 Chronic kidney disease, stage 3 unspecified: Secondary | ICD-10-CM | POA: Diagnosis not present

## 2019-03-17 DIAGNOSIS — I249 Acute ischemic heart disease, unspecified: Secondary | ICD-10-CM | POA: Diagnosis not present

## 2019-03-17 DIAGNOSIS — I129 Hypertensive chronic kidney disease with stage 1 through stage 4 chronic kidney disease, or unspecified chronic kidney disease: Secondary | ICD-10-CM | POA: Diagnosis not present

## 2019-03-17 DIAGNOSIS — F418 Other specified anxiety disorders: Secondary | ICD-10-CM | POA: Diagnosis not present

## 2019-03-18 DIAGNOSIS — I249 Acute ischemic heart disease, unspecified: Secondary | ICD-10-CM | POA: Diagnosis not present

## 2019-03-18 DIAGNOSIS — I129 Hypertensive chronic kidney disease with stage 1 through stage 4 chronic kidney disease, or unspecified chronic kidney disease: Secondary | ICD-10-CM | POA: Diagnosis not present

## 2019-03-18 DIAGNOSIS — F418 Other specified anxiety disorders: Secondary | ICD-10-CM | POA: Diagnosis not present

## 2019-03-18 DIAGNOSIS — N183 Chronic kidney disease, stage 3 unspecified: Secondary | ICD-10-CM | POA: Diagnosis not present

## 2019-03-18 DIAGNOSIS — E1151 Type 2 diabetes mellitus with diabetic peripheral angiopathy without gangrene: Secondary | ICD-10-CM | POA: Diagnosis not present

## 2019-03-18 DIAGNOSIS — J439 Emphysema, unspecified: Secondary | ICD-10-CM | POA: Diagnosis not present

## 2019-03-19 ENCOUNTER — Ambulatory Visit (INDEPENDENT_AMBULATORY_CARE_PROVIDER_SITE_OTHER): Payer: Medicare Other | Admitting: Cardiology

## 2019-03-19 ENCOUNTER — Other Ambulatory Visit: Payer: Self-pay

## 2019-03-19 ENCOUNTER — Encounter: Payer: Self-pay | Admitting: Cardiology

## 2019-03-19 VITALS — BP 160/62 | HR 67 | Ht 65.0 in | Wt 184.0 lb

## 2019-03-19 DIAGNOSIS — E785 Hyperlipidemia, unspecified: Secondary | ICD-10-CM | POA: Diagnosis not present

## 2019-03-19 DIAGNOSIS — I480 Paroxysmal atrial fibrillation: Secondary | ICD-10-CM | POA: Diagnosis not present

## 2019-03-19 DIAGNOSIS — E119 Type 2 diabetes mellitus without complications: Secondary | ICD-10-CM | POA: Diagnosis not present

## 2019-03-19 DIAGNOSIS — I208 Other forms of angina pectoris: Secondary | ICD-10-CM

## 2019-03-19 MED ORDER — ISOSORBIDE MONONITRATE ER 30 MG PO TB24: 30.0000 mg | ORAL_TABLET | Freq: Every day | ORAL | 1 refills | Status: DC

## 2019-03-19 NOTE — Patient Instructions (Signed)
Medication Instructions:  Start Isosorbide 30mg  daily *If you need a refill on your cardiac medications before your next appointment, please call your pharmacy*  Lab Work: none If you have labs (blood work) drawn today and your tests are completely normal, you will receive your results only by: Marland Kitchen MyChart Message (if you have MyChart) OR . A paper copy in the mail If you have any lab test that is abnormal or we need to change your treatment, we will call you to review the results.  Testing/Procedures: none  Follow-Up: At [facility], you and your health needs are our priority.  As part of our continuing mission to provide you with exceptional heart care, we have created designated Provider Care Teams.  These Care Teams include your primary Cardiologist (physician) and Advanced Practice Providers (APPs -  Physician Assistants and Nurse Practitioners) who all work together to provide you with the care you need, when you need it.  Your next appointment:   1 month(s)  The format for your next appointment:   Either In Person or Virtual  Provider:   [name], MD  Other Instructions  Stay Well

## 2019-03-19 NOTE — Progress Notes (Signed)
Cardiology Office Note:    Date:  03/19/2019   ID:  Shelia Sullivan, DOB 1931-06-07, MRN [id number]  PCP:  [name], NP  Cardiologist:  [name], MD    Referring MD: [name], NP   Chief Complaint  Patient presents with  . Hospitalization Follow-up    RH    History of Present Illness:    Shelia Sullivan is a 86 y.o. female with past medical history significant proximal atrial fibrillation, chronic pains, COPD, recent admission to [place] because of atypical chest pain.  Stress test has been done which showed ischemia involving inferior wall.  She elected medical therapy she comes today to follow-up.  Since I have seen her last time in the hospital she got 3 episode of chest pain promptly relieved by nitroglycerin.  She is very sedentary she does not do much she is able to walk around a little bit.  She comes today to my office with her daughter.  Past Medical History:  Diagnosis Date  . Acute chest pain 06/08/2017   Last Assessment & Plan:  Consistent with angina pectoris brought on by atrial fibrillation with rapid ventricular response.  Nuclear scan intermediate risk for significant obstructive coronary artery disease.  Options discussed:  We will proceed with diagnostic cardiac catheterization but forego intervention on an ad Hoc basis as she does have history of a GI bleed has been told not to take aspiri  . Anemia   . Anxiety 06/08/2017  . Anxiety and depression   . Arthritis   . Asthma   . Asthma   . Atrophic vaginitis   . CAD (coronary artery disease)   . Cancer ([place])   . Chronic pain 06/08/2017  . Chronic respiratory failure ([place]) 06/08/2017  . CKD (chronic kidney disease)   . COPD (chronic obstructive pulmonary disease) ([place])   . Dyslipidemia 06/09/2017   Last Assessment & Plan:  Low HDL with LDL 76. Low threshold for continued statin therapy  . Esophageal reflux   . Fibrocystic breast changes of both breasts 06/27/2015  . History of GI bleed  06/08/2017   Last Assessment & Plan:  Stable at present with reasonable hemoglobin and hematocrit.  However, as noted above will defer consideration dual antiplatelet therapy at the present time due to need for  direct oral anticoagulant therapy given history of the atrial fibrillation pending review of the results from the diagnostic catheterization  . History of MI (myocardial infarction) 06/08/2017  . Hypercholesteremia   . Hypertension   . Hypothyroid   . Irritable bladder   . Kidney stone   . Morbid obesity ([place]) 06/27/2015  . Myocardial infarction ([place])   . Neuropathy   . Nocturia   . Osteoarthritis   . Peripheral vascular disease ([place]) 06/08/2017  . Rapid atrial fibrillation ([place]) 06/08/2017   Last Assessment & Plan:  Back in sinus rhythm at present but chads Vasc score is at least 4 based on age, gender, history of hypertension and diabetes.  Recommend anticoagulant therapy with a direct oral anticoagulant.  . Scars   . Stable angina pectoris ([place]) 06/08/2017   Added automatically from request for surgery [phone]  . Swelling   . Type 2 diabetes mellitus with stage 3 chronic kidney disease ([place])   . Type 2 diabetes mellitus, without long-term current use of insulin ([place]) 06/08/2017   Last Assessment & Plan:  Target A1c less than 7.0  . Vitamin D deficiency     Past Surgical  History:  Procedure Laterality Date  . APPENDECTOMY  1950  . ARTHROPLASTY Right 2002   Dr Reeves Forth  . Arthroscopy of shoulder    . BREAST SURGERY    . CARPAL TUNNEL RELEASE Left 2007  . CATARACT EXTRACTION Bilateral   . CHOLECYSTECTOMY  1980  . HERNIA REPAIR  1985  . LITHOTRIPSY  02/21/2017  . PARTIAL HYSTERECTOMY  1950  . REPLACEMENT TOTAL KNEE Left 02/03/2012  . TONSILLECTOMY  1940    Current Medications: Current Meds  Medication Sig  . ADVAIR DISKUS 250-50 MCG/DOSE AEPB Inhale 1 puff into the lungs 2 (two) times daily at 10 AM and 5 PM.   . albuterol (PROVENTIL HFA;VENTOLIN HFA) 108 (90 Base) MCG/ACT  inhaler Inhale 1 puff into the lungs every 6 (six) hours as needed for wheezing or shortness of breath.  . Calcium Carb-Cholecalciferol (CALCIUM 600+D3 PO) Take by mouth daily.  . carvedilol (COREG) 6.25 MG tablet Take 6.25 mg by mouth 2 (two) times daily.   . diclofenac sodium (VOLTAREN) 1 % GEL   . Dulaglutide (TRULICITY) 1.5 0000000 SOPN once a week.  . DULoxetine (CYMBALTA) 30 MG capsule Take 30 mg by mouth daily.  Marland Kitchen ELIQUIS 5 MG TABS tablet TAKE 1 TABLET TWICE A DAY  . Ergocalciferol (VITAMIN D2 PO) Take by mouth daily.  . fluticasone (FLONASE) 50 MCG/ACT nasal spray Place into the nose as needed.  . furosemide (LASIX) 20 MG tablet Take 20 mg by mouth daily.   Marland Kitchen gabapentin (NEURONTIN) 600 MG tablet Take 600 mg by mouth every 8 (eight) hours as needed.  Marland Kitchen HYDROmorphone (DILAUDID) 4 MG tablet TAKE 1 TABLET BY MOUTH EVERY 6 TO 8 HOURS AS NEEDED FOR CHRONIC PAIN (MAX 4 PER DAY)  . LEVEMIR FLEXTOUCH 100 UNIT/ML Pen Inject 50 Units into the skin daily.   Marland Kitchen levothyroxine (SYNTHROID) 50 MCG tablet Take 50 mcg by mouth daily.  Marland Kitchen lisinopril (PRINIVIL,ZESTRIL) 10 MG tablet Take 10 mg by mouth daily.  . Melatonin 5 MG TABS Take 10 mg by mouth at bedtime.  . metFORMIN (GLUCOPHAGE) 500 MG tablet 500 mg daily.  Marland Kitchen MOVANTIK 25 MG TABS tablet   . Multiple Vitamins-Minerals (PRESERVISION AREDS 2+MULTI VIT PO) Take by mouth.  . nitroGLYCERIN (NITROSTAT) 0.4 MG SL tablet Place 0.4 mg under the tongue every 5 (five) minutes as needed for chest pain.  Marland Kitchen omeprazole (PRILOSEC) 20 MG capsule Take 20 mg by mouth daily.   . pravastatin (PRAVACHOL) 80 MG tablet Take 80 mg by mouth at bedtime.   Marland Kitchen QUEtiapine (SEROQUEL) 200 MG tablet Take 200 mg by mouth daily.   . ranolazine (RANEXA) 500 MG 12 hr tablet Take 500 mg by mouth 2 (two) times daily.  . SURE COMFORT PEN NEEDLES 32G X 6 MM MISC      Allergies:   Codeine, Nsaids, and Tolmetin   Social History   Socioeconomic History  . Marital status: Widowed     Spouse name: Not on file  . Number of children: 4  . Years of education: Not on file  . Highest education level: Not on file  Occupational History  . Not on file  Tobacco Use  . Smoking status: Former Smoker    Packs/day: 1.00    Years: 50.00    Pack years: 50.00    Quit date: 03/1998    Years since quitting: 20.9  . Smokeless tobacco: Never Used  Substance and Sexual Activity  . Alcohol use: No  . Drug use: No  .  Sexual activity: Not on file  Other Topics Concern  . Not on file  Social History Narrative   Daughter and grandson live with patient.   Social Determinants of Health   Financial Resource Strain:   . Difficulty of Paying Living Expenses: Not on file  Food Insecurity:   . Worried About Charity fundraiser in the Last Year: Not on file  . Ran Out of Food in the Last Year: Not on file  Transportation Needs:   . Lack of Transportation (Medical): Not on file  . Lack of Transportation (Non-Medical): Not on file  Physical Activity:   . Days of Exercise per Week: Not on file  . Minutes of Exercise per Session: Not on file  Stress:   . Feeling of Stress : Not on file  Social Connections:   . Frequency of Communication with Friends and Family: Not on file  . Frequency of Social Gatherings with Friends and Family: Not on file  . Attends Religious Services: Not on file  . Active Member of Clubs or Organizations: Not on file  . Attends Archivist Meetings: Not on file  . Marital Status: Not on file     Family History: The patient's family history includes Arthritis in her father; Breast cancer in her daughter; Colon cancer in her father; Diabetes in her sister; Heart disease in her father; Hyperlipidemia in her mother; Hypertension in her father and mother; Prostate cancer in her father; Stroke in her father. ROS:   Please see the history of present illness.    All 14 point review of systems negative except as described per history of present  illness  EKGs/Labs/Other Studies Reviewed:      Recent Labs: 04/08/2018: BUN 18; Creatinine, Ser 0.69; Hemoglobin 12.4; Platelets 183; Potassium 4.8; Sodium 146  Recent Lipid Panel    Component Value Date/Time   CHOL 151 09/02/2013 1300   TRIG 171 (H) 09/02/2013 1300   HDL 25 (L) 09/02/2013 1300   CHOLHDL 6.0 09/02/2013 1300   VLDL 34 09/02/2013 1300   LDLCALC 92 09/02/2013 1300    Physical Exam:    VS:  BP (!) 160/62 (BP Location: Left Arm, Patient Position: Sitting, Cuff Size: Normal)   Pulse 67   Ht 5\' 5"  (1.651 m)   Wt 184 lb (83.5 kg)   SpO2 97%   BMI 30.62 kg/m     Wt Readings from Last 3 Encounters:  03/19/19 184 lb (83.5 kg)  02/02/19 188 lb (85.3 kg)  11/25/18 190 lb (86.2 kg)     GEN:  Well nourished, well developed in no acute distress HEENT: Normal NECK: No JVD; No carotid bruits LYMPHATICS: No lymphadenopathy CARDIAC: RRR, no murmurs, no rubs, no gallops RESPIRATORY:  Clear to auscultation without rales, wheezing or rhonchi  ABDOMEN: Soft, non-tender, non-distended MUSCULOSKELETAL:  No edema; No deformity  SKIN: Warm and dry LOWER EXTREMITIES: no swelling NEUROLOGIC:  Alert and oriented x 3 PSYCHIATRIC:  Normal affect   ASSESSMENT:    1. Stable angina pectoris (HCC)   2. Paroxysmal atrial fibrillation ([place])   3. Type 2 diabetes mellitus without complication, without long-term current use of insulin ([place])   4. Dyslipidemia    PLAN:    In order of problems listed above:  1. Stable angina pectoris.  I will put her on Imdur 30 mg daily on top of ranolazine that she is already taking.  I stressed importance of taking nitroglycerin for chest pain.  Also told her to  go look for helping the emergency room if pain is not relieved by nitroglycerin.  I had a long discussion with her as well as with her daughter about management of this situation both of them prefer medical therapy. 2. Paroxysmal atrial fibrillation maintaining sinus rhythm.  Anticoagulated  which I will continue.  She is on aspirin type of anticoagulation now until we try to cool down for coronary artery disease. Type 2 diabetes followed by internal medicine team. Dyslipidemia: On pravastatin 80 mg which I will continue.  Last fasting lipid profile have is from summer which showed LDL of 84.  I did review hospital record for this visit   Medication Adjustments/Labs and Tests Ordered: Current medicines are reviewed at length with the patient today.  Concerns regarding medicines are outlined above.  No orders of the defined types were placed in this encounter.  Medication changes: No orders of the defined types were placed in this encounter.   Signed, Park Liter, MD, [facility] 03/19/2019 10:13 AM    [place]

## 2019-03-24 DIAGNOSIS — E1151 Type 2 diabetes mellitus with diabetic peripheral angiopathy without gangrene: Secondary | ICD-10-CM | POA: Diagnosis not present

## 2019-03-24 DIAGNOSIS — I249 Acute ischemic heart disease, unspecified: Secondary | ICD-10-CM | POA: Diagnosis not present

## 2019-03-24 DIAGNOSIS — N183 Chronic kidney disease, stage 3 unspecified: Secondary | ICD-10-CM | POA: Diagnosis not present

## 2019-03-24 DIAGNOSIS — F418 Other specified anxiety disorders: Secondary | ICD-10-CM | POA: Diagnosis not present

## 2019-03-24 DIAGNOSIS — J439 Emphysema, unspecified: Secondary | ICD-10-CM | POA: Diagnosis not present

## 2019-03-24 DIAGNOSIS — I129 Hypertensive chronic kidney disease with stage 1 through stage 4 chronic kidney disease, or unspecified chronic kidney disease: Secondary | ICD-10-CM | POA: Diagnosis not present

## 2019-03-31 ENCOUNTER — Ambulatory Visit (INDEPENDENT_AMBULATORY_CARE_PROVIDER_SITE_OTHER): Payer: Medicare Other

## 2019-03-31 ENCOUNTER — Other Ambulatory Visit: Payer: Self-pay

## 2019-03-31 DIAGNOSIS — E1151 Type 2 diabetes mellitus with diabetic peripheral angiopathy without gangrene: Secondary | ICD-10-CM | POA: Diagnosis not present

## 2019-03-31 DIAGNOSIS — I480 Paroxysmal atrial fibrillation: Secondary | ICD-10-CM

## 2019-03-31 DIAGNOSIS — I129 Hypertensive chronic kidney disease with stage 1 through stage 4 chronic kidney disease, or unspecified chronic kidney disease: Secondary | ICD-10-CM | POA: Diagnosis not present

## 2019-03-31 DIAGNOSIS — I249 Acute ischemic heart disease, unspecified: Secondary | ICD-10-CM | POA: Diagnosis not present

## 2019-03-31 DIAGNOSIS — F418 Other specified anxiety disorders: Secondary | ICD-10-CM | POA: Diagnosis not present

## 2019-03-31 DIAGNOSIS — N183 Chronic kidney disease, stage 3 unspecified: Secondary | ICD-10-CM | POA: Diagnosis not present

## 2019-03-31 DIAGNOSIS — J439 Emphysema, unspecified: Secondary | ICD-10-CM | POA: Diagnosis not present

## 2019-03-31 NOTE — Progress Notes (Signed)
Complete echocardiogram has been performed.  Jimmy Kierrah Kilbride RDCS, RVT 

## 2019-04-01 DIAGNOSIS — E1151 Type 2 diabetes mellitus with diabetic peripheral angiopathy without gangrene: Secondary | ICD-10-CM | POA: Diagnosis not present

## 2019-04-01 DIAGNOSIS — J439 Emphysema, unspecified: Secondary | ICD-10-CM | POA: Diagnosis not present

## 2019-04-01 DIAGNOSIS — I249 Acute ischemic heart disease, unspecified: Secondary | ICD-10-CM | POA: Diagnosis not present

## 2019-04-01 DIAGNOSIS — I129 Hypertensive chronic kidney disease with stage 1 through stage 4 chronic kidney disease, or unspecified chronic kidney disease: Secondary | ICD-10-CM | POA: Diagnosis not present

## 2019-04-01 DIAGNOSIS — F418 Other specified anxiety disorders: Secondary | ICD-10-CM | POA: Diagnosis not present

## 2019-04-01 DIAGNOSIS — N183 Chronic kidney disease, stage 3 unspecified: Secondary | ICD-10-CM | POA: Diagnosis not present

## 2019-04-04 DIAGNOSIS — N183 Chronic kidney disease, stage 3 unspecified: Secondary | ICD-10-CM | POA: Diagnosis not present

## 2019-04-04 DIAGNOSIS — I480 Paroxysmal atrial fibrillation: Secondary | ICD-10-CM | POA: Diagnosis not present

## 2019-04-04 DIAGNOSIS — Z9981 Dependence on supplemental oxygen: Secondary | ICD-10-CM | POA: Diagnosis not present

## 2019-04-04 DIAGNOSIS — E039 Hypothyroidism, unspecified: Secondary | ICD-10-CM | POA: Diagnosis not present

## 2019-04-04 DIAGNOSIS — E1151 Type 2 diabetes mellitus with diabetic peripheral angiopathy without gangrene: Secondary | ICD-10-CM | POA: Diagnosis not present

## 2019-04-04 DIAGNOSIS — E1165 Type 2 diabetes mellitus with hyperglycemia: Secondary | ICD-10-CM | POA: Diagnosis not present

## 2019-04-04 DIAGNOSIS — Z9181 History of falling: Secondary | ICD-10-CM | POA: Diagnosis not present

## 2019-04-04 DIAGNOSIS — Z87891 Personal history of nicotine dependence: Secondary | ICD-10-CM | POA: Diagnosis not present

## 2019-04-04 DIAGNOSIS — I249 Acute ischemic heart disease, unspecified: Secondary | ICD-10-CM | POA: Diagnosis not present

## 2019-04-04 DIAGNOSIS — I129 Hypertensive chronic kidney disease with stage 1 through stage 4 chronic kidney disease, or unspecified chronic kidney disease: Secondary | ICD-10-CM | POA: Diagnosis not present

## 2019-04-04 DIAGNOSIS — E1122 Type 2 diabetes mellitus with diabetic chronic kidney disease: Secondary | ICD-10-CM | POA: Diagnosis not present

## 2019-04-04 DIAGNOSIS — J439 Emphysema, unspecified: Secondary | ICD-10-CM | POA: Diagnosis not present

## 2019-04-04 DIAGNOSIS — I252 Old myocardial infarction: Secondary | ICD-10-CM | POA: Diagnosis not present

## 2019-04-04 DIAGNOSIS — Z7951 Long term (current) use of inhaled steroids: Secondary | ICD-10-CM | POA: Diagnosis not present

## 2019-04-04 DIAGNOSIS — E7800 Pure hypercholesterolemia, unspecified: Secondary | ICD-10-CM | POA: Diagnosis not present

## 2019-04-04 DIAGNOSIS — Z7901 Long term (current) use of anticoagulants: Secondary | ICD-10-CM | POA: Diagnosis not present

## 2019-04-04 DIAGNOSIS — K219 Gastro-esophageal reflux disease without esophagitis: Secondary | ICD-10-CM | POA: Diagnosis not present

## 2019-04-04 DIAGNOSIS — Z794 Long term (current) use of insulin: Secondary | ICD-10-CM | POA: Diagnosis not present

## 2019-04-04 DIAGNOSIS — F418 Other specified anxiety disorders: Secondary | ICD-10-CM | POA: Diagnosis not present

## 2019-04-04 DIAGNOSIS — I2510 Atherosclerotic heart disease of native coronary artery without angina pectoris: Secondary | ICD-10-CM | POA: Diagnosis not present

## 2019-04-05 DIAGNOSIS — G4733 Obstructive sleep apnea (adult) (pediatric): Secondary | ICD-10-CM | POA: Diagnosis not present

## 2019-04-05 DIAGNOSIS — N183 Chronic kidney disease, stage 3 unspecified: Secondary | ICD-10-CM | POA: Diagnosis not present

## 2019-04-05 DIAGNOSIS — J310 Chronic rhinitis: Secondary | ICD-10-CM | POA: Diagnosis not present

## 2019-04-05 DIAGNOSIS — J439 Emphysema, unspecified: Secondary | ICD-10-CM | POA: Diagnosis not present

## 2019-04-05 DIAGNOSIS — I129 Hypertensive chronic kidney disease with stage 1 through stage 4 chronic kidney disease, or unspecified chronic kidney disease: Secondary | ICD-10-CM | POA: Diagnosis not present

## 2019-04-05 DIAGNOSIS — J4520 Mild intermittent asthma, uncomplicated: Secondary | ICD-10-CM | POA: Diagnosis not present

## 2019-04-05 DIAGNOSIS — I249 Acute ischemic heart disease, unspecified: Secondary | ICD-10-CM | POA: Diagnosis not present

## 2019-04-05 DIAGNOSIS — E559 Vitamin D deficiency, unspecified: Secondary | ICD-10-CM | POA: Diagnosis not present

## 2019-04-05 DIAGNOSIS — F418 Other specified anxiety disorders: Secondary | ICD-10-CM | POA: Diagnosis not present

## 2019-04-05 DIAGNOSIS — R5383 Other fatigue: Secondary | ICD-10-CM | POA: Diagnosis not present

## 2019-04-05 DIAGNOSIS — E1151 Type 2 diabetes mellitus with diabetic peripheral angiopathy without gangrene: Secondary | ICD-10-CM | POA: Diagnosis not present

## 2019-04-06 ENCOUNTER — Telehealth: Payer: Self-pay | Admitting: Cardiology

## 2019-04-06 NOTE — Telephone Encounter (Signed)
Echo report faxed to Dr Chodri'd office .

## 2019-04-06 NOTE — Telephone Encounter (Signed)
Monica from Dr. Maxie Barb office calling to request the patient's echo results. Fax: [phone] Phone: [phone]

## 2019-04-07 DIAGNOSIS — I129 Hypertensive chronic kidney disease with stage 1 through stage 4 chronic kidney disease, or unspecified chronic kidney disease: Secondary | ICD-10-CM | POA: Diagnosis not present

## 2019-04-07 DIAGNOSIS — N183 Chronic kidney disease, stage 3 unspecified: Secondary | ICD-10-CM | POA: Diagnosis not present

## 2019-04-07 DIAGNOSIS — J439 Emphysema, unspecified: Secondary | ICD-10-CM | POA: Diagnosis not present

## 2019-04-07 DIAGNOSIS — E1151 Type 2 diabetes mellitus with diabetic peripheral angiopathy without gangrene: Secondary | ICD-10-CM | POA: Diagnosis not present

## 2019-04-07 DIAGNOSIS — F418 Other specified anxiety disorders: Secondary | ICD-10-CM | POA: Diagnosis not present

## 2019-04-07 DIAGNOSIS — I249 Acute ischemic heart disease, unspecified: Secondary | ICD-10-CM | POA: Diagnosis not present

## 2019-04-12 ENCOUNTER — Telehealth: Payer: Self-pay | Admitting: Cardiology

## 2019-04-12 MED ORDER — RANOLAZINE ER 500 MG PO TB12: 500.0000 mg | ORAL_TABLET | Freq: Two times a day (BID) | ORAL | 3 refills | Status: DC

## 2019-04-12 MED ORDER — RANOLAZINE ER 500 MG PO TB12: 500.0000 mg | ORAL_TABLET | Freq: Two times a day (BID) | ORAL | 0 refills | Status: DC

## 2019-04-12 NOTE — Telephone Encounter (Signed)
*  STAT* If patient is at the pharmacy, call can be transferred to refill team.   1. Which medications need to be refilled? (please list name of each medication and dose if known)  Need new prescription for Ranexa until her mail order comes  2. Which pharmacy/location (including street and city if local pharmacy) is medication to be sent to? [facility] [place]  3. Do they need a 30 day or 90 day supply? #60 s     *STAT* If patient is at the pharmacy, call can be transferred to refill team.   1. Which medications need to be refilled? (please list name of each medication and dose if known) new prescription for Ranexa   2. Which pharmacy/location (including street and city if local pharmacy) is medication to be sent to? [facility]  3. Do they need a 30 day or 90 day supply?90 days and refills

## 2019-04-12 NOTE — Telephone Encounter (Signed)
Ranexa refilled. 30 day supply sent to [facility] in [place], [place] and [facility]

## 2019-04-14 DIAGNOSIS — I249 Acute ischemic heart disease, unspecified: Secondary | ICD-10-CM | POA: Diagnosis not present

## 2019-04-14 DIAGNOSIS — F418 Other specified anxiety disorders: Secondary | ICD-10-CM | POA: Diagnosis not present

## 2019-04-14 DIAGNOSIS — N183 Chronic kidney disease, stage 3 unspecified: Secondary | ICD-10-CM | POA: Diagnosis not present

## 2019-04-14 DIAGNOSIS — E1151 Type 2 diabetes mellitus with diabetic peripheral angiopathy without gangrene: Secondary | ICD-10-CM | POA: Diagnosis not present

## 2019-04-14 DIAGNOSIS — J439 Emphysema, unspecified: Secondary | ICD-10-CM | POA: Diagnosis not present

## 2019-04-14 DIAGNOSIS — I129 Hypertensive chronic kidney disease with stage 1 through stage 4 chronic kidney disease, or unspecified chronic kidney disease: Secondary | ICD-10-CM | POA: Diagnosis not present

## 2019-04-19 DIAGNOSIS — M5136 Other intervertebral disc degeneration, lumbar region: Secondary | ICD-10-CM | POA: Diagnosis not present

## 2019-04-19 DIAGNOSIS — M47816 Spondylosis without myelopathy or radiculopathy, lumbar region: Secondary | ICD-10-CM | POA: Diagnosis not present

## 2019-04-19 DIAGNOSIS — M25551 Pain in right hip: Secondary | ICD-10-CM | POA: Diagnosis not present

## 2019-04-19 DIAGNOSIS — G894 Chronic pain syndrome: Secondary | ICD-10-CM | POA: Diagnosis not present

## 2019-04-19 DIAGNOSIS — Z79891 Long term (current) use of opiate analgesic: Secondary | ICD-10-CM | POA: Diagnosis not present

## 2019-04-19 DIAGNOSIS — Z1389 Encounter for screening for other disorder: Secondary | ICD-10-CM | POA: Diagnosis not present

## 2019-04-19 DIAGNOSIS — M1611 Unilateral primary osteoarthritis, right hip: Secondary | ICD-10-CM | POA: Diagnosis not present

## 2019-04-19 DIAGNOSIS — E538 Deficiency of other specified B group vitamins: Secondary | ICD-10-CM | POA: Diagnosis not present

## 2019-04-20 DIAGNOSIS — I129 Hypertensive chronic kidney disease with stage 1 through stage 4 chronic kidney disease, or unspecified chronic kidney disease: Secondary | ICD-10-CM | POA: Diagnosis not present

## 2019-04-20 DIAGNOSIS — J439 Emphysema, unspecified: Secondary | ICD-10-CM | POA: Diagnosis not present

## 2019-04-20 DIAGNOSIS — F418 Other specified anxiety disorders: Secondary | ICD-10-CM | POA: Diagnosis not present

## 2019-04-20 DIAGNOSIS — I249 Acute ischemic heart disease, unspecified: Secondary | ICD-10-CM | POA: Diagnosis not present

## 2019-04-20 DIAGNOSIS — N183 Chronic kidney disease, stage 3 unspecified: Secondary | ICD-10-CM | POA: Diagnosis not present

## 2019-04-20 DIAGNOSIS — E1151 Type 2 diabetes mellitus with diabetic peripheral angiopathy without gangrene: Secondary | ICD-10-CM | POA: Diagnosis not present

## 2019-04-21 DIAGNOSIS — I129 Hypertensive chronic kidney disease with stage 1 through stage 4 chronic kidney disease, or unspecified chronic kidney disease: Secondary | ICD-10-CM | POA: Diagnosis not present

## 2019-04-21 DIAGNOSIS — N183 Chronic kidney disease, stage 3 unspecified: Secondary | ICD-10-CM | POA: Diagnosis not present

## 2019-04-21 DIAGNOSIS — I249 Acute ischemic heart disease, unspecified: Secondary | ICD-10-CM | POA: Diagnosis not present

## 2019-04-21 DIAGNOSIS — J439 Emphysema, unspecified: Secondary | ICD-10-CM | POA: Diagnosis not present

## 2019-04-21 DIAGNOSIS — E1151 Type 2 diabetes mellitus with diabetic peripheral angiopathy without gangrene: Secondary | ICD-10-CM | POA: Diagnosis not present

## 2019-04-21 DIAGNOSIS — F418 Other specified anxiety disorders: Secondary | ICD-10-CM | POA: Diagnosis not present

## 2019-04-26 ENCOUNTER — Ambulatory Visit (INDEPENDENT_AMBULATORY_CARE_PROVIDER_SITE_OTHER): Payer: Medicare Other | Admitting: Cardiology

## 2019-04-26 ENCOUNTER — Encounter: Payer: Self-pay | Admitting: Cardiology

## 2019-04-26 ENCOUNTER — Other Ambulatory Visit: Payer: Self-pay

## 2019-04-26 VITALS — BP 138/76 | HR 72 | Temp 97.3°F | Ht 65.0 in | Wt 183.0 lb

## 2019-04-26 DIAGNOSIS — E785 Hyperlipidemia, unspecified: Secondary | ICD-10-CM | POA: Diagnosis not present

## 2019-04-26 DIAGNOSIS — I208 Other forms of angina pectoris: Secondary | ICD-10-CM | POA: Diagnosis not present

## 2019-04-26 DIAGNOSIS — I480 Paroxysmal atrial fibrillation: Secondary | ICD-10-CM | POA: Diagnosis not present

## 2019-04-26 NOTE — Progress Notes (Signed)
Cardiology Office Note:    Date:  04/26/2019   ID:  Shelia Sullivan, DOB Jan 19, 1932, MRN [id number]  PCP:  [name], NP  Cardiologist:  [name], MD    Referring MD: [name], NP   No chief complaint on file. Doing well  History of Present Illness:    Shelia Sullivan is a 86 y.o. female with past medical history significant for paroxysmal atrial fibrillation, essential hypertension, dyslipidemia, angina pectoris.  Recent stress test showed ischemia vomiting feeling well but she favor medical therapy.  Since I seen her last time she had one episode of chest pain took nitroglycerin with relief.  We had again long discussion about this I told her that one of the way) to cardiac catheterization, she told me again that she prefer medical therapy.  We will continue.  Past Medical History:  Diagnosis Date  . Acute chest pain 06/08/2017   Last Assessment & Plan:  Consistent with angina pectoris brought on by atrial fibrillation with rapid ventricular response.  Nuclear scan intermediate risk for significant obstructive coronary artery disease.  Options discussed:  We will proceed with diagnostic cardiac catheterization but forego intervention on an ad Hoc basis as she does have history of a GI bleed has been told not to take aspiri  . Anemia   . Anxiety 06/08/2017  . Anxiety and depression   . Arthritis   . Asthma   . Asthma   . Atrophic vaginitis   . CAD (coronary artery disease)   . Cancer ([place])   . Chronic pain 06/08/2017  . Chronic respiratory failure ([place]) 06/08/2017  . CKD (chronic kidney disease)   . COPD (chronic obstructive pulmonary disease) ([place])   . Dyslipidemia 06/09/2017   Last Assessment & Plan:  Low HDL with LDL 76. Low threshold for continued statin therapy  . Esophageal reflux   . Fibrocystic breast changes of both breasts 06/27/2015  . History of GI bleed 06/08/2017   Last Assessment & Plan:  Stable at present with reasonable hemoglobin and hematocrit.   However, as noted above will defer consideration dual antiplatelet therapy at the present time due to need for  direct oral anticoagulant therapy given history of the atrial fibrillation pending review of the results from the diagnostic catheterization  . History of MI (myocardial infarction) 06/08/2017  . Hypercholesteremia   . Hypertension   . Hypothyroid   . Irritable bladder   . Kidney stone   . Morbid obesity ([place]) 06/27/2015  . Myocardial infarction ([place])   . Neuropathy   . Nocturia   . Osteoarthritis   . Peripheral vascular disease ([place]) 06/08/2017  . Rapid atrial fibrillation ([place]) 06/08/2017   Last Assessment & Plan:  Back in sinus rhythm at present but chads Vasc score is at least 4 based on age, gender, history of hypertension and diabetes.  Recommend anticoagulant therapy with a direct oral anticoagulant.  . Scars   . Stable angina pectoris ([place]) 06/08/2017   Added automatically from request for surgery [phone]  . Swelling   . Type 2 diabetes mellitus with stage 3 chronic kidney disease ([place])   . Type 2 diabetes mellitus, without long-term current use of insulin ([place]) 06/08/2017   Last Assessment & Plan:  Target A1c less than 7.0  . Vitamin D deficiency     Past Surgical History:  Procedure Laterality Date  . APPENDECTOMY  1950  . ARTHROPLASTY Right 2002   Dr Reeves Forth  . Arthroscopy of shoulder    .  BREAST SURGERY    . CARPAL TUNNEL RELEASE Left 2007  . CATARACT EXTRACTION Bilateral   . CHOLECYSTECTOMY  1980  . HERNIA REPAIR  1985  . LITHOTRIPSY  02/21/2017  . PARTIAL HYSTERECTOMY  1950  . REPLACEMENT TOTAL KNEE Left 02/03/2012  . TONSILLECTOMY  1940    Current Medications: Current Meds  Medication Sig  . ADVAIR DISKUS 250-50 MCG/DOSE AEPB Inhale 1 puff into the lungs 2 (two) times daily at 10 AM and 5 PM.   . albuterol (PROVENTIL HFA;VENTOLIN HFA) 108 (90 Base) MCG/ACT inhaler Inhale 1 puff into the lungs every 6 (six) hours as needed for wheezing or shortness of  breath.  . Calcium Carb-Cholecalciferol (CALCIUM 600+D3 PO) Take by mouth daily.  . carvedilol (COREG) 6.25 MG tablet Take 6.25 mg by mouth 2 (two) times daily.   . diclofenac sodium (VOLTAREN) 1 % GEL   . Dulaglutide (TRULICITY) 1.5 0000000 SOPN once a week.  . DULoxetine (CYMBALTA) 30 MG capsule Take 30 mg by mouth daily.  Marland Kitchen ELIQUIS 5 MG TABS tablet TAKE 1 TABLET TWICE A DAY  . Ergocalciferol (VITAMIN D2 PO) Take by mouth daily.  . fluticasone (FLONASE) 50 MCG/ACT nasal spray Place into the nose as needed.  . furosemide (LASIX) 20 MG tablet Take 20 mg by mouth daily.   Marland Kitchen gabapentin (NEURONTIN) 600 MG tablet Take 600 mg by mouth every 8 (eight) hours as needed.  Marland Kitchen HYDROmorphone (DILAUDID) 4 MG tablet TAKE 1 TABLET BY MOUTH EVERY 6 TO 8 HOURS AS NEEDED FOR CHRONIC PAIN (MAX 4 PER DAY)  . isosorbide mononitrate (IMDUR) 30 MG 24 hr tablet Take 1 tablet (30 mg total) by mouth daily.  Marland Kitchen LEVEMIR FLEXTOUCH 100 UNIT/ML Pen Inject 50 Units into the skin daily.   Marland Kitchen levothyroxine (SYNTHROID) 50 MCG tablet Take 50 mcg by mouth daily.  Marland Kitchen lisinopril (PRINIVIL,ZESTRIL) 10 MG tablet Take 10 mg by mouth daily.  . Melatonin 5 MG TABS Take 10 mg by mouth at bedtime.  . metFORMIN (GLUCOPHAGE) 500 MG tablet 500 mg daily.  Marland Kitchen MOVANTIK 25 MG TABS tablet   . Multiple Vitamins-Minerals (PRESERVISION AREDS 2+MULTI VIT PO) Take by mouth.  . nitroGLYCERIN (NITROSTAT) 0.4 MG SL tablet Place 0.4 mg under the tongue every 5 (five) minutes as needed for chest pain.  Marland Kitchen omeprazole (PRILOSEC) 20 MG capsule Take 20 mg by mouth daily.   . pravastatin (PRAVACHOL) 80 MG tablet Take 80 mg by mouth at bedtime.   Marland Kitchen QUEtiapine (SEROQUEL) 200 MG tablet Take 200 mg by mouth daily.   . ranolazine (RANEXA) 500 MG 12 hr tablet Take 1 tablet (500 mg total) by mouth 2 (two) times daily.  . SURE COMFORT PEN NEEDLES 32G X 6 MM MISC      Allergies:   Codeine, Nsaids, and Tolmetin   Social History   Socioeconomic History  . Marital  status: Widowed    Spouse name: Not on file  . Number of children: 4  . Years of education: Not on file  . Highest education level: Not on file  Occupational History  . Not on file  Tobacco Use  . Smoking status: Former Smoker    Packs/day: 1.00    Years: 50.00    Pack years: 50.00    Quit date: 03/1998    Years since quitting: 21.0  . Smokeless tobacco: Never Used  Substance and Sexual Activity  . Alcohol use: No  . Drug use: No  . Sexual activity: Not Currently  Other Topics Concern  . Not on file  Social History Narrative   Daughter and grandson live with patient.   Social Determinants of Health   Financial Resource Strain:   . Difficulty of Paying Living Expenses: Not on file  Food Insecurity:   . Worried About Charity fundraiser in the Last Year: Not on file  . Ran Out of Food in the Last Year: Not on file  Transportation Needs:   . Lack of Transportation (Medical): Not on file  . Lack of Transportation (Non-Medical): Not on file  Physical Activity:   . Days of Exercise per Week: Not on file  . Minutes of Exercise per Session: Not on file  Stress:   . Feeling of Stress : Not on file  Social Connections:   . Frequency of Communication with Friends and Family: Not on file  . Frequency of Social Gatherings with Friends and Family: Not on file  . Attends Religious Services: Not on file  . Active Member of Clubs or Organizations: Not on file  . Attends Archivist Meetings: Not on file  . Marital Status: Not on file     Family History: The patient's family history includes Arthritis in her father; Breast cancer in her daughter; Colon cancer in her father; Diabetes in her sister; Heart disease in her father; Hyperlipidemia in her mother; Hypertension in her father and mother; Prostate cancer in her father; Stroke in her father. ROS:   Please see the history of present illness.    All 14 point review of systems negative except as described per history of  present illness  EKGs/Labs/Other Studies Reviewed:      Recent Labs: No results found for requested labs within last 8760 hours.  Recent Lipid Panel    Component Value Date/Time   CHOL 151 09/02/2013 1300   TRIG 171 (H) 09/02/2013 1300   HDL 25 (L) 09/02/2013 1300   CHOLHDL 6.0 09/02/2013 1300   VLDL 34 09/02/2013 1300   LDLCALC 92 09/02/2013 1300    Physical Exam:    VS:  BP 138/76   Pulse 72   Temp (!) 97.3 F (36.3 C)   Ht 5\' 5"  (1.651 m)   Wt 183 lb (83 kg)   SpO2 98%   BMI 30.45 kg/m     Wt Readings from Last 3 Encounters:  04/26/19 183 lb (83 kg)  03/19/19 184 lb (83.5 kg)  02/02/19 188 lb (85.3 kg)     GEN:  Well nourished, well developed in no acute distress HEENT: Normal NECK: No JVD; No carotid bruits LYMPHATICS: No lymphadenopathy CARDIAC: RRR, no murmurs, no rubs, no gallops RESPIRATORY:  Clear to auscultation without rales, wheezing or rhonchi  ABDOMEN: Soft, non-tender, non-distended MUSCULOSKELETAL:  No edema; No deformity  SKIN: Warm and dry LOWER EXTREMITIES: no swelling NEUROLOGIC:  Alert and oriented x 3 PSYCHIATRIC:  Normal affect   ASSESSMENT:    1. Stable angina pectoris (HCC)   2. Paroxysmal atrial fibrillation ([place])   3. Dyslipidemia    PLAN:    In order of problems listed above:  1. Stable angina pectoris, she prefers medical therapy which I will continue. 2. Paroxysmal atrial fibrillation anticoagulated denies having recent palpitation we will continue present management. 3. Dyslipidemia: She is on pravastatin 80 mg which I will continue.   Medication Adjustments/Labs and Tests Ordered: Current medicines are reviewed at length with the patient today.  Concerns regarding medicines are outlined above.  No orders of the  defined types were placed in this encounter.  Medication changes: No orders of the defined types were placed in this encounter.   Signed, Park Liter, MD, [facility] 04/26/2019 12:22 PM    [facility]

## 2019-04-26 NOTE — Patient Instructions (Signed)

## 2019-04-27 DIAGNOSIS — N183 Chronic kidney disease, stage 3 unspecified: Secondary | ICD-10-CM | POA: Diagnosis not present

## 2019-04-27 DIAGNOSIS — E1151 Type 2 diabetes mellitus with diabetic peripheral angiopathy without gangrene: Secondary | ICD-10-CM | POA: Diagnosis not present

## 2019-04-27 DIAGNOSIS — F418 Other specified anxiety disorders: Secondary | ICD-10-CM | POA: Diagnosis not present

## 2019-04-27 DIAGNOSIS — I249 Acute ischemic heart disease, unspecified: Secondary | ICD-10-CM | POA: Diagnosis not present

## 2019-04-27 DIAGNOSIS — I129 Hypertensive chronic kidney disease with stage 1 through stage 4 chronic kidney disease, or unspecified chronic kidney disease: Secondary | ICD-10-CM | POA: Diagnosis not present

## 2019-04-27 DIAGNOSIS — J439 Emphysema, unspecified: Secondary | ICD-10-CM | POA: Diagnosis not present

## 2019-04-28 DIAGNOSIS — I129 Hypertensive chronic kidney disease with stage 1 through stage 4 chronic kidney disease, or unspecified chronic kidney disease: Secondary | ICD-10-CM | POA: Diagnosis not present

## 2019-04-28 DIAGNOSIS — F418 Other specified anxiety disorders: Secondary | ICD-10-CM | POA: Diagnosis not present

## 2019-04-28 DIAGNOSIS — E1151 Type 2 diabetes mellitus with diabetic peripheral angiopathy without gangrene: Secondary | ICD-10-CM | POA: Diagnosis not present

## 2019-04-28 DIAGNOSIS — J439 Emphysema, unspecified: Secondary | ICD-10-CM | POA: Diagnosis not present

## 2019-04-28 DIAGNOSIS — N183 Chronic kidney disease, stage 3 unspecified: Secondary | ICD-10-CM | POA: Diagnosis not present

## 2019-04-28 DIAGNOSIS — I249 Acute ischemic heart disease, unspecified: Secondary | ICD-10-CM | POA: Diagnosis not present

## 2019-05-04 DIAGNOSIS — G4733 Obstructive sleep apnea (adult) (pediatric): Secondary | ICD-10-CM | POA: Diagnosis not present

## 2019-05-04 DIAGNOSIS — R5383 Other fatigue: Secondary | ICD-10-CM | POA: Diagnosis not present

## 2019-05-04 DIAGNOSIS — J310 Chronic rhinitis: Secondary | ICD-10-CM | POA: Diagnosis not present

## 2019-05-04 DIAGNOSIS — J4520 Mild intermittent asthma, uncomplicated: Secondary | ICD-10-CM | POA: Diagnosis not present

## 2019-05-04 DIAGNOSIS — E559 Vitamin D deficiency, unspecified: Secondary | ICD-10-CM | POA: Diagnosis not present

## 2019-05-12 ENCOUNTER — Ambulatory Visit: Payer: Medicare Other | Admitting: Sports Medicine

## 2019-05-12 DIAGNOSIS — G894 Chronic pain syndrome: Secondary | ICD-10-CM | POA: Diagnosis not present

## 2019-05-12 DIAGNOSIS — M1611 Unilateral primary osteoarthritis, right hip: Secondary | ICD-10-CM | POA: Diagnosis not present

## 2019-05-18 DIAGNOSIS — E538 Deficiency of other specified B group vitamins: Secondary | ICD-10-CM | POA: Diagnosis not present

## 2019-05-25 DIAGNOSIS — Z79899 Other long term (current) drug therapy: Secondary | ICD-10-CM | POA: Diagnosis not present

## 2019-05-25 DIAGNOSIS — N200 Calculus of kidney: Secondary | ICD-10-CM | POA: Diagnosis not present

## 2019-05-25 DIAGNOSIS — N390 Urinary tract infection, site not specified: Secondary | ICD-10-CM | POA: Diagnosis not present

## 2019-05-25 DIAGNOSIS — N3289 Other specified disorders of bladder: Secondary | ICD-10-CM | POA: Diagnosis not present

## 2019-06-01 DIAGNOSIS — E039 Hypothyroidism, unspecified: Secondary | ICD-10-CM | POA: Diagnosis not present

## 2019-06-01 DIAGNOSIS — N183 Chronic kidney disease, stage 3 unspecified: Secondary | ICD-10-CM | POA: Diagnosis not present

## 2019-06-01 DIAGNOSIS — E1122 Type 2 diabetes mellitus with diabetic chronic kidney disease: Secondary | ICD-10-CM | POA: Diagnosis not present

## 2019-06-01 DIAGNOSIS — Z452 Encounter for adjustment and management of vascular access device: Secondary | ICD-10-CM | POA: Diagnosis not present

## 2019-06-01 DIAGNOSIS — Z7951 Long term (current) use of inhaled steroids: Secondary | ICD-10-CM | POA: Diagnosis not present

## 2019-06-01 DIAGNOSIS — I2510 Atherosclerotic heart disease of native coronary artery without angina pectoris: Secondary | ICD-10-CM | POA: Diagnosis not present

## 2019-06-01 DIAGNOSIS — I480 Paroxysmal atrial fibrillation: Secondary | ICD-10-CM | POA: Diagnosis not present

## 2019-06-01 DIAGNOSIS — I249 Acute ischemic heart disease, unspecified: Secondary | ICD-10-CM | POA: Diagnosis not present

## 2019-06-01 DIAGNOSIS — N3289 Other specified disorders of bladder: Secondary | ICD-10-CM | POA: Diagnosis not present

## 2019-06-01 DIAGNOSIS — I129 Hypertensive chronic kidney disease with stage 1 through stage 4 chronic kidney disease, or unspecified chronic kidney disease: Secondary | ICD-10-CM | POA: Diagnosis not present

## 2019-06-01 DIAGNOSIS — E1151 Type 2 diabetes mellitus with diabetic peripheral angiopathy without gangrene: Secondary | ICD-10-CM | POA: Diagnosis not present

## 2019-06-01 DIAGNOSIS — Z7984 Long term (current) use of oral hypoglycemic drugs: Secondary | ICD-10-CM | POA: Diagnosis not present

## 2019-06-01 DIAGNOSIS — E1165 Type 2 diabetes mellitus with hyperglycemia: Secondary | ICD-10-CM | POA: Diagnosis not present

## 2019-06-01 DIAGNOSIS — G4730 Sleep apnea, unspecified: Secondary | ICD-10-CM | POA: Diagnosis not present

## 2019-06-01 DIAGNOSIS — N200 Calculus of kidney: Secondary | ICD-10-CM | POA: Diagnosis not present

## 2019-06-01 DIAGNOSIS — F418 Other specified anxiety disorders: Secondary | ICD-10-CM | POA: Diagnosis not present

## 2019-06-01 DIAGNOSIS — Z79891 Long term (current) use of opiate analgesic: Secondary | ICD-10-CM | POA: Diagnosis not present

## 2019-06-01 DIAGNOSIS — Z7902 Long term (current) use of antithrombotics/antiplatelets: Secondary | ICD-10-CM | POA: Diagnosis not present

## 2019-06-01 DIAGNOSIS — J439 Emphysema, unspecified: Secondary | ICD-10-CM | POA: Diagnosis not present

## 2019-06-01 DIAGNOSIS — N390 Urinary tract infection, site not specified: Secondary | ICD-10-CM | POA: Diagnosis not present

## 2019-06-01 DIAGNOSIS — Z79899 Other long term (current) drug therapy: Secondary | ICD-10-CM | POA: Diagnosis not present

## 2019-06-01 DIAGNOSIS — K219 Gastro-esophageal reflux disease without esophagitis: Secondary | ICD-10-CM | POA: Diagnosis not present

## 2019-06-01 DIAGNOSIS — E7800 Pure hypercholesterolemia, unspecified: Secondary | ICD-10-CM | POA: Diagnosis not present

## 2019-06-01 DIAGNOSIS — I252 Old myocardial infarction: Secondary | ICD-10-CM | POA: Diagnosis not present

## 2019-06-01 DIAGNOSIS — Z794 Long term (current) use of insulin: Secondary | ICD-10-CM | POA: Diagnosis not present

## 2019-06-02 DIAGNOSIS — E1151 Type 2 diabetes mellitus with diabetic peripheral angiopathy without gangrene: Secondary | ICD-10-CM | POA: Diagnosis not present

## 2019-06-02 DIAGNOSIS — N390 Urinary tract infection, site not specified: Secondary | ICD-10-CM | POA: Diagnosis not present

## 2019-06-02 DIAGNOSIS — Z452 Encounter for adjustment and management of vascular access device: Secondary | ICD-10-CM | POA: Diagnosis not present

## 2019-06-02 DIAGNOSIS — F418 Other specified anxiety disorders: Secondary | ICD-10-CM | POA: Diagnosis not present

## 2019-06-02 DIAGNOSIS — I249 Acute ischemic heart disease, unspecified: Secondary | ICD-10-CM | POA: Diagnosis not present

## 2019-06-02 DIAGNOSIS — I129 Hypertensive chronic kidney disease with stage 1 through stage 4 chronic kidney disease, or unspecified chronic kidney disease: Secondary | ICD-10-CM | POA: Diagnosis not present

## 2019-06-03 DIAGNOSIS — F418 Other specified anxiety disorders: Secondary | ICD-10-CM | POA: Diagnosis not present

## 2019-06-03 DIAGNOSIS — E1151 Type 2 diabetes mellitus with diabetic peripheral angiopathy without gangrene: Secondary | ICD-10-CM | POA: Diagnosis not present

## 2019-06-03 DIAGNOSIS — I129 Hypertensive chronic kidney disease with stage 1 through stage 4 chronic kidney disease, or unspecified chronic kidney disease: Secondary | ICD-10-CM | POA: Diagnosis not present

## 2019-06-03 DIAGNOSIS — N390 Urinary tract infection, site not specified: Secondary | ICD-10-CM | POA: Diagnosis not present

## 2019-06-03 DIAGNOSIS — I249 Acute ischemic heart disease, unspecified: Secondary | ICD-10-CM | POA: Diagnosis not present

## 2019-06-03 DIAGNOSIS — Z452 Encounter for adjustment and management of vascular access device: Secondary | ICD-10-CM | POA: Diagnosis not present

## 2019-06-04 DIAGNOSIS — I249 Acute ischemic heart disease, unspecified: Secondary | ICD-10-CM | POA: Diagnosis not present

## 2019-06-04 DIAGNOSIS — F418 Other specified anxiety disorders: Secondary | ICD-10-CM | POA: Diagnosis not present

## 2019-06-04 DIAGNOSIS — N390 Urinary tract infection, site not specified: Secondary | ICD-10-CM | POA: Diagnosis not present

## 2019-06-04 DIAGNOSIS — I129 Hypertensive chronic kidney disease with stage 1 through stage 4 chronic kidney disease, or unspecified chronic kidney disease: Secondary | ICD-10-CM | POA: Diagnosis not present

## 2019-06-04 DIAGNOSIS — Z452 Encounter for adjustment and management of vascular access device: Secondary | ICD-10-CM | POA: Diagnosis not present

## 2019-06-04 DIAGNOSIS — E1151 Type 2 diabetes mellitus with diabetic peripheral angiopathy without gangrene: Secondary | ICD-10-CM | POA: Diagnosis not present

## 2019-06-10 DIAGNOSIS — F418 Other specified anxiety disorders: Secondary | ICD-10-CM | POA: Diagnosis not present

## 2019-06-10 DIAGNOSIS — E1151 Type 2 diabetes mellitus with diabetic peripheral angiopathy without gangrene: Secondary | ICD-10-CM | POA: Diagnosis not present

## 2019-06-10 DIAGNOSIS — N390 Urinary tract infection, site not specified: Secondary | ICD-10-CM | POA: Diagnosis not present

## 2019-06-10 DIAGNOSIS — I249 Acute ischemic heart disease, unspecified: Secondary | ICD-10-CM | POA: Diagnosis not present

## 2019-06-10 DIAGNOSIS — I129 Hypertensive chronic kidney disease with stage 1 through stage 4 chronic kidney disease, or unspecified chronic kidney disease: Secondary | ICD-10-CM | POA: Diagnosis not present

## 2019-06-10 DIAGNOSIS — Z452 Encounter for adjustment and management of vascular access device: Secondary | ICD-10-CM | POA: Diagnosis not present

## 2019-06-15 DIAGNOSIS — M1611 Unilateral primary osteoarthritis, right hip: Secondary | ICD-10-CM | POA: Diagnosis not present

## 2019-06-15 DIAGNOSIS — F418 Other specified anxiety disorders: Secondary | ICD-10-CM | POA: Diagnosis not present

## 2019-06-15 DIAGNOSIS — Z79891 Long term (current) use of opiate analgesic: Secondary | ICD-10-CM | POA: Diagnosis not present

## 2019-06-15 DIAGNOSIS — I129 Hypertensive chronic kidney disease with stage 1 through stage 4 chronic kidney disease, or unspecified chronic kidney disease: Secondary | ICD-10-CM | POA: Diagnosis not present

## 2019-06-15 DIAGNOSIS — I249 Acute ischemic heart disease, unspecified: Secondary | ICD-10-CM | POA: Diagnosis not present

## 2019-06-15 DIAGNOSIS — M47816 Spondylosis without myelopathy or radiculopathy, lumbar region: Secondary | ICD-10-CM | POA: Diagnosis not present

## 2019-06-15 DIAGNOSIS — G894 Chronic pain syndrome: Secondary | ICD-10-CM | POA: Diagnosis not present

## 2019-06-15 DIAGNOSIS — Z452 Encounter for adjustment and management of vascular access device: Secondary | ICD-10-CM | POA: Diagnosis not present

## 2019-06-15 DIAGNOSIS — M5136 Other intervertebral disc degeneration, lumbar region: Secondary | ICD-10-CM | POA: Diagnosis not present

## 2019-06-15 DIAGNOSIS — Z1389 Encounter for screening for other disorder: Secondary | ICD-10-CM | POA: Diagnosis not present

## 2019-06-15 DIAGNOSIS — M25551 Pain in right hip: Secondary | ICD-10-CM | POA: Diagnosis not present

## 2019-06-15 DIAGNOSIS — N390 Urinary tract infection, site not specified: Secondary | ICD-10-CM | POA: Diagnosis not present

## 2019-06-15 DIAGNOSIS — E1151 Type 2 diabetes mellitus with diabetic peripheral angiopathy without gangrene: Secondary | ICD-10-CM | POA: Diagnosis not present

## 2019-06-16 ENCOUNTER — Ambulatory Visit (INDEPENDENT_AMBULATORY_CARE_PROVIDER_SITE_OTHER): Payer: Medicare Other | Admitting: Sports Medicine

## 2019-06-16 ENCOUNTER — Encounter: Payer: Self-pay | Admitting: Sports Medicine

## 2019-06-16 ENCOUNTER — Other Ambulatory Visit: Payer: Self-pay

## 2019-06-16 DIAGNOSIS — B351 Tinea unguium: Secondary | ICD-10-CM

## 2019-06-16 DIAGNOSIS — M2040 Other hammer toe(s) (acquired), unspecified foot: Secondary | ICD-10-CM

## 2019-06-16 DIAGNOSIS — L84 Corns and callosities: Secondary | ICD-10-CM

## 2019-06-16 DIAGNOSIS — I739 Peripheral vascular disease, unspecified: Secondary | ICD-10-CM | POA: Diagnosis not present

## 2019-06-16 DIAGNOSIS — E1140 Type 2 diabetes mellitus with diabetic neuropathy, unspecified: Secondary | ICD-10-CM | POA: Diagnosis not present

## 2019-06-16 DIAGNOSIS — M79675 Pain in left toe(s): Secondary | ICD-10-CM | POA: Diagnosis not present

## 2019-06-16 DIAGNOSIS — M79674 Pain in right toe(s): Secondary | ICD-10-CM | POA: Diagnosis not present

## 2019-06-16 NOTE — Progress Notes (Signed)
Subjective: Shelia Sullivan is a 86 y.o. female patient with history of diabetes who presents to office today complaining of long, painful nails and callus while ambulating in shoes; unable to trim. Patient states that the glucose reading was 160, few days ago, Last saw Dr Leroy Sea 6 weeks ago and last A1c 5.  Reports no changes with medication or any new issues at this time.  Patient Active Problem List   Diagnosis Date Noted  . Hematuria 01/06/2018  . Paroxysmal atrial fibrillation ([place]) 09/30/2017  . Dyslipidemia 06/09/2017  . Type 2 diabetes mellitus, without long-term current use of insulin ([place]) 06/08/2017  . Stable angina pectoris ([place]) 06/08/2017  . Rapid atrial fibrillation ([place]) 06/08/2017  . Peripheral vascular disease ([place]) 06/08/2017  . History of GI bleed 06/08/2017  . History of MI (myocardial infarction) 06/08/2017  . Kidney stone 06/08/2017  . Chronic respiratory failure ([place]) 06/08/2017  . Chronic pain 06/08/2017  . Asthma 06/08/2017  . Anxiety 06/08/2017  . Acute chest pain 06/08/2017  . Morbid obesity ([place]) 06/27/2015  . Fibrocystic breast changes of both breasts 06/27/2015   Current Outpatient Medications on File Prior to Visit  Medication Sig Dispense Refill  . ADVAIR DISKUS 250-50 MCG/DOSE AEPB Inhale 1 puff into the lungs 2 (two) times daily at 10 AM and 5 PM.     . albuterol (PROVENTIL HFA;VENTOLIN HFA) 108 (90 Base) MCG/ACT inhaler Inhale 1 puff into the lungs every 6 (six) hours as needed for wheezing or shortness of breath.    . Calcium Carb-Cholecalciferol (CALCIUM 600+D3 PO) Take by mouth daily.    . carvedilol (COREG) 6.25 MG tablet Take 6.25 mg by mouth 2 (two) times daily.     . diclofenac sodium (VOLTAREN) 1 % GEL     . Dulaglutide (TRULICITY) 1.5 0000000 SOPN once a week.    . DULoxetine (CYMBALTA) 30 MG capsule Take 30 mg by mouth daily.    Marland Kitchen ELIQUIS 5 MG TABS tablet TAKE 1 TABLET TWICE A DAY 180 tablet 3  . Ergocalciferol (VITAMIN D2 PO) Take by  mouth daily.    . fluticasone (FLONASE) 50 MCG/ACT nasal spray Place into the nose as needed.    . furosemide (LASIX) 20 MG tablet Take 20 mg by mouth daily.     Marland Kitchen gabapentin (NEURONTIN) 600 MG tablet Take 600 mg by mouth every 8 (eight) hours as needed.    Marland Kitchen HYDROmorphone (DILAUDID) 4 MG tablet TAKE 1 TABLET BY MOUTH EVERY 6 TO 8 HOURS AS NEEDED FOR CHRONIC PAIN (MAX 4 PER DAY)  0  . isosorbide mononitrate (IMDUR) 30 MG 24 hr tablet Take 1 tablet (30 mg total) by mouth daily. 30 tablet 1  . LEVEMIR FLEXTOUCH 100 UNIT/ML Pen Inject 50 Units into the skin daily.     Marland Kitchen levothyroxine (SYNTHROID) 50 MCG tablet Take 50 mcg by mouth daily.    Marland Kitchen lisinopril (PRINIVIL,ZESTRIL) 10 MG tablet Take 10 mg by mouth daily.    . Melatonin 5 MG TABS Take 10 mg by mouth at bedtime.    . metFORMIN (GLUCOPHAGE) 500 MG tablet 500 mg daily.    Marland Kitchen MOVANTIK 25 MG TABS tablet     . Multiple Vitamins-Minerals (PRESERVISION AREDS 2+MULTI VIT PO) Take by mouth.    . nitrofurantoin (MACRODANTIN) 50 MG capsule Take 50 mg by mouth daily.    . nitroGLYCERIN (NITROSTAT) 0.4 MG SL tablet Place 0.4 mg under the tongue every 5 (five) minutes as needed for chest pain.    Marland Kitchen  omeprazole (PRILOSEC) 20 MG capsule Take 20 mg by mouth daily.     . pravastatin (PRAVACHOL) 80 MG tablet Take 80 mg by mouth at bedtime.     Marland Kitchen QUEtiapine (SEROQUEL) 200 MG tablet Take 200 mg by mouth daily.     . ranolazine (RANEXA) 500 MG 12 hr tablet Take 1 tablet (500 mg total) by mouth 2 (two) times daily. 180 tablet 3  . SURE COMFORT PEN NEEDLES 32G X 6 MM MISC      No current facility-administered medications on file prior to visit.   Allergies  Allergen Reactions  . Codeine   . Nsaids Other (See Comments)    GI Bleeding  . Tolmetin Other (See Comments)    GI Bleeding    No results found for this or any previous visit (from the past 2160 hour(s)).  Objective: General: Patient is awake, alert, and oriented x 3 and in no acute  distress.  Integument: Skin is dry and supple bilateral. Nails are tender, long, thickened and dystrophic with subungual debris, consistent with onychomycosis, 2-5 bilateral.  There is no hallux nails bilateral from previous removal. No signs of infection. + callus at tips of 3rd toes bilateral. Remaining integument unremarkable.  Vasculature:  Dorsalis Pedis pulse 1/4 bilateral. Posterior Tibial pulse  0/4 bilateral. Capillary fill time <5 sec 1-5 bilateral. Scant hair growth to the level of the digits.Temperature gradient mildly decreased bilateral. + varicosities present bilateral. Trace edema present bilateral, L>R chronic.   Neurology: The patient has absent sensation measured with a 5.07/10g Semmes Weinstein Monofilament at all pedal sites bilateral . Vibratory sensation diminished bilateral with tuning fork. No Babinski sign present bilateral.   Musculoskeletal: Asymptomatic hammertoe and fat pad atrophy with planus pedal deformities noted bilateral. Muscular strength 5/5 in all lower extremity muscular groups bilateral without pain on range of motion however stiffness likely arthritis present. No tenderness with calf compression bilateral.  Assessment and Plan: Problem List Items Addressed This Visit    None    Visit Diagnoses    Pain due to onychomycosis of toenails of both feet    -  Primary   Relevant Medications   nitrofurantoin (MACRODANTIN) 50 MG capsule   Callus of foot       Hammer toe, unspecified laterality       Type 2 diabetes, controlled, with neuropathy (HCC)       PVD (peripheral vascular disease) ([place])         -Examined patient. -Re-Discussed and educated patient on diabetic foot care, especially with  regards to the vascular, neurological and musculoskeletal systems.  -Mechanically debrided callus x 2 using rotary bur at no charge without incident -Mechanically debrided  all nails 2-5 bilateral using sterile nail nipper and filed with dremel without incident   -Patient to return  in 2-1/2-3 months for at risk foot care -Patient advised to call the office if any problems or questions arise in the meantime.  Landis Martins, DPM

## 2019-06-18 DIAGNOSIS — E538 Deficiency of other specified B group vitamins: Secondary | ICD-10-CM | POA: Diagnosis not present

## 2019-06-22 DIAGNOSIS — N390 Urinary tract infection, site not specified: Secondary | ICD-10-CM | POA: Diagnosis not present

## 2019-06-22 DIAGNOSIS — N3289 Other specified disorders of bladder: Secondary | ICD-10-CM | POA: Diagnosis not present

## 2019-06-22 DIAGNOSIS — N200 Calculus of kidney: Secondary | ICD-10-CM | POA: Diagnosis not present

## 2019-06-24 ENCOUNTER — Other Ambulatory Visit: Payer: Self-pay | Admitting: Cardiology

## 2019-06-24 NOTE — Telephone Encounter (Signed)
LOV with Dr. Agustin Cree was 04/26/2019

## 2019-06-28 DIAGNOSIS — F418 Other specified anxiety disorders: Secondary | ICD-10-CM | POA: Diagnosis not present

## 2019-06-28 DIAGNOSIS — Z452 Encounter for adjustment and management of vascular access device: Secondary | ICD-10-CM | POA: Diagnosis not present

## 2019-06-28 DIAGNOSIS — E1151 Type 2 diabetes mellitus with diabetic peripheral angiopathy without gangrene: Secondary | ICD-10-CM | POA: Diagnosis not present

## 2019-06-28 DIAGNOSIS — I249 Acute ischemic heart disease, unspecified: Secondary | ICD-10-CM | POA: Diagnosis not present

## 2019-06-28 DIAGNOSIS — N390 Urinary tract infection, site not specified: Secondary | ICD-10-CM | POA: Diagnosis not present

## 2019-06-28 DIAGNOSIS — I129 Hypertensive chronic kidney disease with stage 1 through stage 4 chronic kidney disease, or unspecified chronic kidney disease: Secondary | ICD-10-CM | POA: Diagnosis not present

## 2019-07-01 DIAGNOSIS — Z79899 Other long term (current) drug therapy: Secondary | ICD-10-CM | POA: Diagnosis not present

## 2019-07-01 DIAGNOSIS — I129 Hypertensive chronic kidney disease with stage 1 through stage 4 chronic kidney disease, or unspecified chronic kidney disease: Secondary | ICD-10-CM | POA: Diagnosis not present

## 2019-07-01 DIAGNOSIS — Z794 Long term (current) use of insulin: Secondary | ICD-10-CM | POA: Diagnosis not present

## 2019-07-01 DIAGNOSIS — Z452 Encounter for adjustment and management of vascular access device: Secondary | ICD-10-CM | POA: Diagnosis not present

## 2019-07-01 DIAGNOSIS — L853 Xerosis cutis: Secondary | ICD-10-CM | POA: Diagnosis not present

## 2019-07-01 DIAGNOSIS — E039 Hypothyroidism, unspecified: Secondary | ICD-10-CM | POA: Diagnosis not present

## 2019-07-01 DIAGNOSIS — K219 Gastro-esophageal reflux disease without esophagitis: Secondary | ICD-10-CM | POA: Diagnosis not present

## 2019-07-01 DIAGNOSIS — G4730 Sleep apnea, unspecified: Secondary | ICD-10-CM | POA: Diagnosis not present

## 2019-07-01 DIAGNOSIS — Z79891 Long term (current) use of opiate analgesic: Secondary | ICD-10-CM | POA: Diagnosis not present

## 2019-07-01 DIAGNOSIS — E1151 Type 2 diabetes mellitus with diabetic peripheral angiopathy without gangrene: Secondary | ICD-10-CM | POA: Diagnosis not present

## 2019-07-01 DIAGNOSIS — N3289 Other specified disorders of bladder: Secondary | ICD-10-CM | POA: Diagnosis not present

## 2019-07-01 DIAGNOSIS — F418 Other specified anxiety disorders: Secondary | ICD-10-CM | POA: Diagnosis not present

## 2019-07-01 DIAGNOSIS — Z7951 Long term (current) use of inhaled steroids: Secondary | ICD-10-CM | POA: Diagnosis not present

## 2019-07-01 DIAGNOSIS — Z7902 Long term (current) use of antithrombotics/antiplatelets: Secondary | ICD-10-CM | POA: Diagnosis not present

## 2019-07-01 DIAGNOSIS — I252 Old myocardial infarction: Secondary | ICD-10-CM | POA: Diagnosis not present

## 2019-07-01 DIAGNOSIS — E1122 Type 2 diabetes mellitus with diabetic chronic kidney disease: Secondary | ICD-10-CM | POA: Diagnosis not present

## 2019-07-01 DIAGNOSIS — I480 Paroxysmal atrial fibrillation: Secondary | ICD-10-CM | POA: Diagnosis not present

## 2019-07-01 DIAGNOSIS — E7800 Pure hypercholesterolemia, unspecified: Secondary | ICD-10-CM | POA: Diagnosis not present

## 2019-07-01 DIAGNOSIS — E1165 Type 2 diabetes mellitus with hyperglycemia: Secondary | ICD-10-CM | POA: Diagnosis not present

## 2019-07-01 DIAGNOSIS — I2510 Atherosclerotic heart disease of native coronary artery without angina pectoris: Secondary | ICD-10-CM | POA: Diagnosis not present

## 2019-07-01 DIAGNOSIS — R21 Rash and other nonspecific skin eruption: Secondary | ICD-10-CM | POA: Diagnosis not present

## 2019-07-01 DIAGNOSIS — N200 Calculus of kidney: Secondary | ICD-10-CM | POA: Diagnosis not present

## 2019-07-01 DIAGNOSIS — J439 Emphysema, unspecified: Secondary | ICD-10-CM | POA: Diagnosis not present

## 2019-07-01 DIAGNOSIS — N390 Urinary tract infection, site not specified: Secondary | ICD-10-CM | POA: Diagnosis not present

## 2019-07-01 DIAGNOSIS — I249 Acute ischemic heart disease, unspecified: Secondary | ICD-10-CM | POA: Diagnosis not present

## 2019-07-01 DIAGNOSIS — Z7984 Long term (current) use of oral hypoglycemic drugs: Secondary | ICD-10-CM | POA: Diagnosis not present

## 2019-07-01 DIAGNOSIS — N183 Chronic kidney disease, stage 3 unspecified: Secondary | ICD-10-CM | POA: Diagnosis not present

## 2019-07-02 DIAGNOSIS — G4733 Obstructive sleep apnea (adult) (pediatric): Secondary | ICD-10-CM | POA: Diagnosis not present

## 2019-07-02 DIAGNOSIS — N200 Calculus of kidney: Secondary | ICD-10-CM | POA: Diagnosis not present

## 2019-07-06 DIAGNOSIS — J310 Chronic rhinitis: Secondary | ICD-10-CM | POA: Diagnosis not present

## 2019-07-06 DIAGNOSIS — R5383 Other fatigue: Secondary | ICD-10-CM | POA: Diagnosis not present

## 2019-07-06 DIAGNOSIS — J4520 Mild intermittent asthma, uncomplicated: Secondary | ICD-10-CM | POA: Diagnosis not present

## 2019-07-06 DIAGNOSIS — G4733 Obstructive sleep apnea (adult) (pediatric): Secondary | ICD-10-CM | POA: Diagnosis not present

## 2019-07-06 DIAGNOSIS — E559 Vitamin D deficiency, unspecified: Secondary | ICD-10-CM | POA: Diagnosis not present

## 2019-07-13 DIAGNOSIS — N390 Urinary tract infection, site not specified: Secondary | ICD-10-CM | POA: Diagnosis not present

## 2019-07-13 DIAGNOSIS — Z452 Encounter for adjustment and management of vascular access device: Secondary | ICD-10-CM | POA: Diagnosis not present

## 2019-07-13 DIAGNOSIS — E1151 Type 2 diabetes mellitus with diabetic peripheral angiopathy without gangrene: Secondary | ICD-10-CM | POA: Diagnosis not present

## 2019-07-13 DIAGNOSIS — I129 Hypertensive chronic kidney disease with stage 1 through stage 4 chronic kidney disease, or unspecified chronic kidney disease: Secondary | ICD-10-CM | POA: Diagnosis not present

## 2019-07-13 DIAGNOSIS — F418 Other specified anxiety disorders: Secondary | ICD-10-CM | POA: Diagnosis not present

## 2019-07-13 DIAGNOSIS — I249 Acute ischemic heart disease, unspecified: Secondary | ICD-10-CM | POA: Diagnosis not present

## 2019-07-16 DIAGNOSIS — M47816 Spondylosis without myelopathy or radiculopathy, lumbar region: Secondary | ICD-10-CM | POA: Diagnosis not present

## 2019-07-16 DIAGNOSIS — Z1389 Encounter for screening for other disorder: Secondary | ICD-10-CM | POA: Diagnosis not present

## 2019-07-16 DIAGNOSIS — Z79891 Long term (current) use of opiate analgesic: Secondary | ICD-10-CM | POA: Diagnosis not present

## 2019-07-16 DIAGNOSIS — G894 Chronic pain syndrome: Secondary | ICD-10-CM | POA: Diagnosis not present

## 2019-07-16 DIAGNOSIS — M25551 Pain in right hip: Secondary | ICD-10-CM | POA: Diagnosis not present

## 2019-07-16 DIAGNOSIS — M5136 Other intervertebral disc degeneration, lumbar region: Secondary | ICD-10-CM | POA: Diagnosis not present

## 2019-07-16 DIAGNOSIS — M1611 Unilateral primary osteoarthritis, right hip: Secondary | ICD-10-CM | POA: Diagnosis not present

## 2019-07-19 DIAGNOSIS — Z7951 Long term (current) use of inhaled steroids: Secondary | ICD-10-CM | POA: Diagnosis not present

## 2019-07-19 DIAGNOSIS — Z7901 Long term (current) use of anticoagulants: Secondary | ICD-10-CM | POA: Diagnosis not present

## 2019-07-19 DIAGNOSIS — M1612 Unilateral primary osteoarthritis, left hip: Secondary | ICD-10-CM | POA: Diagnosis not present

## 2019-07-19 DIAGNOSIS — J449 Chronic obstructive pulmonary disease, unspecified: Secondary | ICD-10-CM | POA: Diagnosis not present

## 2019-07-19 DIAGNOSIS — M85852 Other specified disorders of bone density and structure, left thigh: Secondary | ICD-10-CM | POA: Diagnosis not present

## 2019-07-19 DIAGNOSIS — R519 Headache, unspecified: Secondary | ICD-10-CM | POA: Diagnosis not present

## 2019-07-19 DIAGNOSIS — Z794 Long term (current) use of insulin: Secondary | ICD-10-CM | POA: Diagnosis not present

## 2019-07-19 DIAGNOSIS — N390 Urinary tract infection, site not specified: Secondary | ICD-10-CM | POA: Diagnosis not present

## 2019-07-19 DIAGNOSIS — Z87891 Personal history of nicotine dependence: Secondary | ICD-10-CM | POA: Diagnosis not present

## 2019-07-19 DIAGNOSIS — S7002XA Contusion of left hip, initial encounter: Secondary | ICD-10-CM | POA: Diagnosis not present

## 2019-07-19 DIAGNOSIS — J9610 Chronic respiratory failure, unspecified whether with hypoxia or hypercapnia: Secondary | ICD-10-CM | POA: Diagnosis not present

## 2019-07-19 DIAGNOSIS — S0990XA Unspecified injury of head, initial encounter: Secondary | ICD-10-CM | POA: Diagnosis not present

## 2019-07-19 DIAGNOSIS — M25551 Pain in right hip: Secondary | ICD-10-CM | POA: Diagnosis not present

## 2019-07-19 DIAGNOSIS — I252 Old myocardial infarction: Secondary | ICD-10-CM | POA: Diagnosis not present

## 2019-07-19 DIAGNOSIS — R42 Dizziness and giddiness: Secondary | ICD-10-CM | POA: Diagnosis not present

## 2019-07-19 DIAGNOSIS — Z9981 Dependence on supplemental oxygen: Secondary | ICD-10-CM | POA: Diagnosis not present

## 2019-07-19 DIAGNOSIS — I959 Hypotension, unspecified: Secondary | ICD-10-CM | POA: Diagnosis not present

## 2019-07-19 DIAGNOSIS — E860 Dehydration: Secondary | ICD-10-CM | POA: Diagnosis not present

## 2019-07-19 DIAGNOSIS — E1151 Type 2 diabetes mellitus with diabetic peripheral angiopathy without gangrene: Secondary | ICD-10-CM | POA: Diagnosis not present

## 2019-07-19 DIAGNOSIS — M79605 Pain in left leg: Secondary | ICD-10-CM | POA: Diagnosis not present

## 2019-07-19 DIAGNOSIS — D6489 Other specified anemias: Secondary | ICD-10-CM | POA: Diagnosis not present

## 2019-07-19 DIAGNOSIS — G8929 Other chronic pain: Secondary | ICD-10-CM | POA: Diagnosis not present

## 2019-07-19 DIAGNOSIS — S79911A Unspecified injury of right hip, initial encounter: Secondary | ICD-10-CM | POA: Diagnosis not present

## 2019-07-19 DIAGNOSIS — I10 Essential (primary) hypertension: Secondary | ICD-10-CM | POA: Diagnosis not present

## 2019-07-19 DIAGNOSIS — Z79899 Other long term (current) drug therapy: Secondary | ICD-10-CM | POA: Diagnosis not present

## 2019-07-19 DIAGNOSIS — K219 Gastro-esophageal reflux disease without esophagitis: Secondary | ICD-10-CM | POA: Diagnosis not present

## 2019-07-19 DIAGNOSIS — F419 Anxiety disorder, unspecified: Secondary | ICD-10-CM | POA: Diagnosis not present

## 2019-07-21 ENCOUNTER — Other Ambulatory Visit: Payer: Self-pay

## 2019-07-27 ENCOUNTER — Encounter: Payer: Self-pay | Admitting: Cardiology

## 2019-07-27 ENCOUNTER — Ambulatory Visit (INDEPENDENT_AMBULATORY_CARE_PROVIDER_SITE_OTHER): Payer: Medicare Other | Admitting: Cardiology

## 2019-07-27 ENCOUNTER — Other Ambulatory Visit: Payer: Self-pay

## 2019-07-27 VITALS — BP 110/62 | HR 78 | Ht 65.0 in | Wt 172.4 lb

## 2019-07-27 DIAGNOSIS — R296 Repeated falls: Secondary | ICD-10-CM

## 2019-07-27 DIAGNOSIS — I208 Other forms of angina pectoris: Secondary | ICD-10-CM

## 2019-07-27 DIAGNOSIS — I480 Paroxysmal atrial fibrillation: Secondary | ICD-10-CM

## 2019-07-27 DIAGNOSIS — E785 Hyperlipidemia, unspecified: Secondary | ICD-10-CM

## 2019-07-27 HISTORY — DX: Repeated falls: R29.6

## 2019-07-27 NOTE — Patient Instructions (Signed)

## 2019-07-27 NOTE — Progress Notes (Signed)
Cardiology Office Note:    Date:  07/27/2019   ID:  Shelia Sullivan, DOB Jul 15, 1931, MRN [id number]  PCP:  [name], NP  Cardiologist:  [name], MD    Referring MD: [name], NP   Chief Complaint  Patient presents with  . Follow-up    3 MO FU   I fell down twice  History of Present Illness:    Shelia Sullivan is a 86 y.o. female with past medical history significant of paroxysmal atrial fibrillation, essential hypertension, dyslipidemia, angina pectoris.  She did have a stress test done in 2019 showing some small area of ischemia, however, she prefers medical therapy.  She comes today to my office for follow-up she described the fact that she fell down twice since I seen her last time first time in May 23 second time on May 31.  She simply tripped over her feet slide under her and that is how she fell down.  She did not passed out.  She did not significantly injure herself however she went to the emergency room.  Denies having any chest pain tightness squeezing pressure burning chest no palpitations no dizziness.  She was find to be in atrial fibrillation while in the hospital.  This is according to her son who is with her in the room.  Now her side heart sounds regular heart rate is 78.  Past Medical History:  Diagnosis Date  . Acute chest pain 06/08/2017   Last Assessment & Plan:  Consistent with angina pectoris brought on by atrial fibrillation with rapid ventricular response.  Nuclear scan intermediate risk for significant obstructive coronary artery disease.  Options discussed:  We will proceed with diagnostic cardiac catheterization but forego intervention on an ad Hoc basis as she does have history of a GI bleed has been told not to take aspiri  . Anemia   . Anxiety 06/08/2017  . Anxiety and depression   . Arthritis   . Asthma   . Asthma   . Atrophic vaginitis   . CAD (coronary artery disease)   . Cancer ([place])   . Chronic pain 06/08/2017  . Chronic  respiratory failure ([place]) 06/08/2017  . CKD (chronic kidney disease)   . COPD (chronic obstructive pulmonary disease) ([place])   . Dyslipidemia 06/09/2017   Last Assessment & Plan:  Low HDL with LDL 76. Low threshold for continued statin therapy  . Esophageal reflux   . Fibrocystic breast changes of both breasts 06/27/2015  . History of GI bleed 06/08/2017   Last Assessment & Plan:  Stable at present with reasonable hemoglobin and hematocrit.  However, as noted above will defer consideration dual antiplatelet therapy at the present time due to need for  direct oral anticoagulant therapy given history of the atrial fibrillation pending review of the results from the diagnostic catheterization  . History of MI (myocardial infarction) 06/08/2017  . Hypercholesteremia   . Hypertension   . Hypothyroid   . Irritable bladder   . Kidney stone   . Morbid obesity ([place]) 06/27/2015  . Myocardial infarction ([place])   . Neuropathy   . Nocturia   . Osteoarthritis   . Peripheral vascular disease ([place]) 06/08/2017  . Rapid atrial fibrillation ([place]) 06/08/2017   Last Assessment & Plan:  Back in sinus rhythm at present but chads Vasc score is at least 4 based on age, gender, history of hypertension and diabetes.  Recommend anticoagulant therapy with a direct oral anticoagulant.  . Scars   .  Stable angina pectoris ([place]) 06/08/2017   Added automatically from request for surgery [phone]  . Swelling   . Type 2 diabetes mellitus with stage 3 chronic kidney disease ([place])   . Type 2 diabetes mellitus, without long-term current use of insulin ([place]) 06/08/2017   Last Assessment & Plan:  Target A1c less than 7.0  . Vitamin D deficiency     Past Surgical History:  Procedure Laterality Date  . APPENDECTOMY  1950  . ARTHROPLASTY Right 2002   Dr Reeves Forth  . Arthroscopy of shoulder    . BREAST SURGERY    . CARPAL TUNNEL RELEASE Left 2007  . CATARACT EXTRACTION Bilateral   . CHOLECYSTECTOMY  1980  . HERNIA REPAIR  1985  .  LITHOTRIPSY  02/21/2017  . PARTIAL HYSTERECTOMY  1950  . REPLACEMENT TOTAL KNEE Left 02/03/2012  . TONSILLECTOMY  1940    Current Medications: Current Meds  Medication Sig  . ADVAIR DISKUS 250-50 MCG/DOSE AEPB Inhale 1 puff into the lungs 2 (two) times daily at 10 AM and 5 PM.   . albuterol (PROVENTIL HFA;VENTOLIN HFA) 108 (90 Base) MCG/ACT inhaler Inhale 1 puff into the lungs every 6 (six) hours as needed for wheezing or shortness of breath.  . B-D INSULIN SYRINGE 29G X 1/2" 0.5 ML MISC USE 1 SYRINGE SUBCUTANEOUSLY ONCE EVERY MONTH  . Calcium Carb-Cholecalciferol (CALCIUM 600+D3 PO) Take by mouth daily.  . carvedilol (COREG) 6.25 MG tablet Take 6.25 mg by mouth 2 (two) times daily.   . cephALEXin (KEFLEX) 500 MG capsule Take 500 mg by mouth 3 (three) times daily.  . cyanocobalamin (,VITAMIN B-12,) 1000 MCG/ML injection Inject 1,000 mcg into the muscle every 30 (thirty) days.  . diclofenac sodium (VOLTAREN) 1 % GEL   . Dulaglutide (TRULICITY) 1.5 0000000 SOPN once a week.  . DULoxetine (CYMBALTA) 30 MG capsule Take 30 mg by mouth daily.  Marland Kitchen ELIQUIS 5 MG TABS tablet TAKE 1 TABLET TWICE A DAY  . Ergocalciferol (VITAMIN D2 PO) Take by mouth daily.  . famotidine (PEPCID) 20 MG tablet Take 20 mg by mouth 2 (two) times daily.  . fluticasone (FLONASE) 50 MCG/ACT nasal spray Place into the nose as needed.  . furosemide (LASIX) 20 MG tablet Take 20 mg by mouth daily.   Marland Kitchen gabapentin (NEURONTIN) 600 MG tablet Take 600 mg by mouth every 8 (eight) hours as needed.  Marland Kitchen HYDROmorphone (DILAUDID) 4 MG tablet TAKE 1 TABLET BY MOUTH EVERY 6 TO 8 HOURS AS NEEDED FOR CHRONIC PAIN (MAX 4 PER DAY)  . isosorbide mononitrate (IMDUR) 30 MG 24 hr tablet Take 1 tablet (30 mg total) by mouth daily.  Marland Kitchen LEVEMIR FLEXTOUCH 100 UNIT/ML Pen Inject 50 Units into the skin daily.   Marland Kitchen levothyroxine (SYNTHROID) 50 MCG tablet Take 50 mcg by mouth daily.  Marland Kitchen lisinopril (PRINIVIL,ZESTRIL) 10 MG tablet Take 10 mg by mouth daily.  .  Melatonin 5 MG TABS Take 10 mg by mouth at bedtime.  . metFORMIN (GLUCOPHAGE) 500 MG tablet 500 mg daily.  Marland Kitchen MOVANTIK 25 MG TABS tablet   . Multiple Vitamins-Minerals (PRESERVISION AREDS 2+MULTI VIT PO) Take by mouth.  . nitrofurantoin (MACRODANTIN) 50 MG capsule Take 50 mg by mouth daily.  . nitroGLYCERIN (NITROSTAT) 0.4 MG SL tablet Place 0.4 mg under the tongue every 5 (five) minutes as needed for chest pain.  Marland Kitchen omeprazole (PRILOSEC) 20 MG capsule Take 20 mg by mouth daily.   . pravastatin (PRAVACHOL) 80 MG tablet Take 80 mg by  mouth at bedtime.   Marland Kitchen QUEtiapine (SEROQUEL) 200 MG tablet Take 200 mg by mouth daily.   . ranolazine (RANEXA) 500 MG 12 hr tablet Take 1 tablet (500 mg total) by mouth 2 (two) times daily.  . SURE COMFORT PEN NEEDLES 32G X 6 MM MISC      Allergies:   Codeine, Nsaids, and Tolmetin   Social History   Socioeconomic History  . Marital status: Widowed    Spouse name: Not on file  . Number of children: 4  . Years of education: Not on file  . Highest education level: Not on file  Occupational History  . Not on file  Tobacco Use  . Smoking status: Former Smoker    Packs/day: 1.00    Years: 50.00    Pack years: 50.00    Quit date: 03/1998    Years since quitting: 21.3  . Smokeless tobacco: Never Used  Substance and Sexual Activity  . Alcohol use: No  . Drug use: No  . Sexual activity: Not Currently  Other Topics Concern  . Not on file  Social History Narrative   Daughter and grandson live with patient.   Social Determinants of Health   Financial Resource Strain:   . Difficulty of Paying Living Expenses:   Food Insecurity:   . Worried About Charity fundraiser in the Last Year:   . Arboriculturist in the Last Year:   Transportation Needs:   . Film/video editor (Medical):   Marland Kitchen Lack of Transportation (Non-Medical):   Physical Activity:   . Days of Exercise per Week:   . Minutes of Exercise per Session:   Stress:   . Feeling of Stress :     Social Connections:   . Frequency of Communication with Friends and Family:   . Frequency of Social Gatherings with Friends and Family:   . Attends Religious Services:   . Active Member of Clubs or Organizations:   . Attends Archivist Meetings:   Marland Kitchen Marital Status:      Family History: The patient's family history includes Arthritis in her father; Breast cancer in her daughter; Colon cancer in her father; Diabetes in her sister; Heart disease in her father; Hyperlipidemia in her mother; Hypertension in her father and mother; Prostate cancer in her father; Stroke in her father. ROS:   Please see the history of present illness.    All 14 point review of systems negative except as described per history of present illness  EKGs/Labs/Other Studies Reviewed:      Recent Labs: No results found for requested labs within last 8760 hours.  Recent Lipid Panel    Component Value Date/Time   CHOL 151 09/02/2013 1300   TRIG 171 (H) 09/02/2013 1300   HDL 25 (L) 09/02/2013 1300   CHOLHDL 6.0 09/02/2013 1300   VLDL 34 09/02/2013 1300   LDLCALC 92 09/02/2013 1300    Physical Exam:    VS:  BP 110/62   Pulse 78   Ht 5\' 5"  (1.651 m)   Wt 172 lb 6.4 oz (78.2 kg)   SpO2 96%   BMI 28.69 kg/m     Wt Readings from Last 3 Encounters:  07/27/19 172 lb 6.4 oz (78.2 kg)  04/26/19 183 lb (83 kg)  03/19/19 184 lb (83.5 kg)     GEN:  Well nourished, well developed in no acute distress HEENT: Normal NECK: No JVD; No carotid bruits LYMPHATICS: No lymphadenopathy CARDIAC: RRR, soft systolic  murmur grade 1/6 best heard at left border of the sternum, no rubs, no gallops RESPIRATORY:  Clear to auscultation without rales, wheezing or rhonchi  ABDOMEN: Soft, non-tender, non-distended MUSCULOSKELETAL:  No edema; No deformity  SKIN: Warm and dry LOWER EXTREMITIES: no swelling NEUROLOGIC:  Alert and oriented x 3 PSYCHIATRIC:  Normal affect   ASSESSMENT:    1. Paroxysmal atrial  fibrillation (HCC)   2. Stable angina pectoris ([place])   3. Dyslipidemia   4. Frequent falls    PLAN:    In order of problems listed above:  1. Paroxysmal atrial fibrillation, anticoagulated which I will continue.  Her AV node is suppressed with the beta-blocker which I will continue. 2. Stable angina pectoris denies having any. 3. Dyslipidemia: I reviewed her K PN from December 07, 2018 showing LDL of 66 and HDL of 30.  We will continue present management. 4. Frequent falls obviously very concerning especially taking today, the fact that she use anticoagulation.  We talked in length about what to do to improve the situation I spoke to her son and asked him to look around her house to see if there is any potential positive for her.  Also recommended to talk to primary care physician about physical therapy to improve her strength and balance.   Medication Adjustments/Labs and Tests Ordered: Current medicines are reviewed at length with the patient today.  Concerns regarding medicines are outlined above.  No orders of the defined types were placed in this encounter.  Medication changes: No orders of the defined types were placed in this encounter.   Signed, Park Liter, MD, [facility] 07/27/2019 2:43 PM    [place]

## 2019-07-28 DIAGNOSIS — I129 Hypertensive chronic kidney disease with stage 1 through stage 4 chronic kidney disease, or unspecified chronic kidney disease: Secondary | ICD-10-CM | POA: Diagnosis not present

## 2019-07-28 DIAGNOSIS — F418 Other specified anxiety disorders: Secondary | ICD-10-CM | POA: Diagnosis not present

## 2019-07-28 DIAGNOSIS — E1151 Type 2 diabetes mellitus with diabetic peripheral angiopathy without gangrene: Secondary | ICD-10-CM | POA: Diagnosis not present

## 2019-07-28 DIAGNOSIS — N390 Urinary tract infection, site not specified: Secondary | ICD-10-CM | POA: Diagnosis not present

## 2019-07-28 DIAGNOSIS — Z452 Encounter for adjustment and management of vascular access device: Secondary | ICD-10-CM | POA: Diagnosis not present

## 2019-07-28 DIAGNOSIS — I249 Acute ischemic heart disease, unspecified: Secondary | ICD-10-CM | POA: Diagnosis not present

## 2019-09-14 DIAGNOSIS — M47816 Spondylosis without myelopathy or radiculopathy, lumbar region: Secondary | ICD-10-CM | POA: Diagnosis not present

## 2019-09-14 DIAGNOSIS — M5136 Other intervertebral disc degeneration, lumbar region: Secondary | ICD-10-CM | POA: Diagnosis not present

## 2019-09-14 DIAGNOSIS — M25551 Pain in right hip: Secondary | ICD-10-CM | POA: Diagnosis not present

## 2019-09-14 DIAGNOSIS — Z79891 Long term (current) use of opiate analgesic: Secondary | ICD-10-CM | POA: Diagnosis not present

## 2019-09-14 DIAGNOSIS — G894 Chronic pain syndrome: Secondary | ICD-10-CM | POA: Diagnosis not present

## 2019-09-14 DIAGNOSIS — M1611 Unilateral primary osteoarthritis, right hip: Secondary | ICD-10-CM | POA: Diagnosis not present

## 2019-09-15 ENCOUNTER — Ambulatory Visit: Payer: Medicare Other | Admitting: Sports Medicine

## 2019-09-22 ENCOUNTER — Other Ambulatory Visit: Payer: Self-pay

## 2019-09-22 MED ORDER — ISOSORBIDE MONONITRATE ER 30 MG PO TB24: 30.0000 mg | ORAL_TABLET | Freq: Every day | ORAL | 2 refills | Status: DC

## 2019-09-22 NOTE — Telephone Encounter (Signed)
Refill sent in for Isosorbide 30 mg to [facility]

## 2019-09-23 ENCOUNTER — Other Ambulatory Visit: Payer: Self-pay

## 2019-09-23 ENCOUNTER — Ambulatory Visit (INDEPENDENT_AMBULATORY_CARE_PROVIDER_SITE_OTHER): Payer: Medicare Other | Admitting: Podiatry

## 2019-09-23 ENCOUNTER — Encounter: Payer: Self-pay | Admitting: Podiatry

## 2019-09-23 DIAGNOSIS — E1151 Type 2 diabetes mellitus with diabetic peripheral angiopathy without gangrene: Secondary | ICD-10-CM

## 2019-09-23 DIAGNOSIS — M79674 Pain in right toe(s): Secondary | ICD-10-CM

## 2019-09-23 DIAGNOSIS — M2041 Other hammer toe(s) (acquired), right foot: Secondary | ICD-10-CM

## 2019-09-23 DIAGNOSIS — M79675 Pain in left toe(s): Secondary | ICD-10-CM | POA: Diagnosis not present

## 2019-09-23 DIAGNOSIS — B351 Tinea unguium: Secondary | ICD-10-CM

## 2019-09-23 DIAGNOSIS — L84 Corns and callosities: Secondary | ICD-10-CM

## 2019-09-23 DIAGNOSIS — M2042 Other hammer toe(s) (acquired), left foot: Secondary | ICD-10-CM

## 2019-09-23 NOTE — Progress Notes (Signed)
Subjective:  Patient ID: Shelia Sullivan, female    DOB: 26-Apr-1931,  MRN: [id number]  86 y.o. female presents with at risk foot care. Pt has h/o NIDDM with PAD and painful corn(s) b/l 3rd digits and left 4th digit  which interfere(s) with ambulation. Aggravating factors include wearing enclosed shoe gear. Pain is relieved with periodic professional debridement.   Shelia Sullivan states corns on left foot are most painful on today's visit.    PCP is Shelia Blalock, NP; last visit was 04/04/2019.  Review of Systems: Negative except as noted in the HPI.  Past Medical History:  Diagnosis Date   Acute chest pain 06/08/2017   Last Assessment & Plan:  Consistent with angina pectoris brought on by atrial fibrillation with rapid ventricular response.  Nuclear scan intermediate risk for significant obstructive coronary artery disease.  Options discussed:  We will proceed with diagnostic cardiac catheterization but forego intervention on an ad Hoc basis as she does have history of a GI bleed has been told not to take aspiri   Anemia    Anxiety 06/08/2017   Anxiety and depression    Arthritis    Asthma    Asthma    Atrophic vaginitis    CAD (coronary artery disease)    Cancer (HCC)    Chronic pain 06/08/2017   Chronic respiratory failure ([place]) 06/08/2017   CKD (chronic kidney disease)    COPD (chronic obstructive pulmonary disease) ([place])    Dyslipidemia 06/09/2017   Last Assessment & Plan:  Low HDL with LDL 76. Low threshold for continued statin therapy   Esophageal reflux    Fibrocystic breast changes of both breasts 06/27/2015   History of GI bleed 06/08/2017   Last Assessment & Plan:  Stable at present with reasonable hemoglobin and hematocrit.  However, as noted above will defer consideration dual antiplatelet therapy at the present time due to need for  direct oral anticoagulant therapy given history of the atrial fibrillation pending review of the results from the diagnostic  catheterization   History of MI (myocardial infarction) 06/08/2017   Hypercholesteremia    Hypertension    Hypothyroid    Irritable bladder    Kidney stone    Morbid obesity ([place]) 06/27/2015   Myocardial infarction [facility])    Neuropathy    Nocturia    Osteoarthritis    Peripheral vascular disease ([place]) 06/08/2017   Rapid atrial fibrillation ([place]) 06/08/2017   Last Assessment & Plan:  Back in sinus rhythm at present but chads Vasc score is at least 4 based on age, gender, history of hypertension and diabetes.  Recommend anticoagulant therapy with a direct oral anticoagulant.   Scars    Stable angina pectoris ([place]) 06/08/2017   Added automatically from request for surgery [phone]   Swelling    Type 2 diabetes mellitus with stage 3 chronic kidney disease ([place])    Type 2 diabetes mellitus, without long-term current use of insulin ([place]) 06/08/2017   Last Assessment & Plan:  Target A1c less than 7.0   Vitamin D deficiency    Past Surgical History:  Procedure Laterality Date   APPENDECTOMY  1950   ARTHROPLASTY Right 2002   Dr Reeves Forth   Arthroscopy of shoulder     BREAST SURGERY     CARPAL TUNNEL RELEASE Left 2007   CATARACT EXTRACTION Bilateral    [place]   LITHOTRIPSY  02/21/2017   PARTIAL HYSTERECTOMY  1950   REPLACEMENT TOTAL KNEE  Left 02/03/2012   TONSILLECTOMY  1940   Patient Active Problem List   Diagnosis Date Noted   Frequent falls 07/27/2019   Hematuria 01/06/2018   Paroxysmal atrial fibrillation ([place]) 09/30/2017   Dyslipidemia 06/09/2017   Type 2 diabetes mellitus, without long-term current use of insulin (HCC) 06/08/2017   Stable angina pectoris (HCC) 06/08/2017   Rapid atrial fibrillation ([place]) 06/08/2017   Peripheral vascular disease ([place]) 06/08/2017   History of GI bleed 06/08/2017   History of MI (myocardial infarction) 06/08/2017   Kidney stone 06/08/2017   Chronic respiratory failure  ([place]) 06/08/2017   Chronic pain 06/08/2017   Asthma 06/08/2017   Anxiety 06/08/2017   Acute chest pain 06/08/2017   Morbid obesity ([place]) 06/27/2015   Fibrocystic breast changes of both breasts 06/27/2015    Current Outpatient Medications:    ADVAIR DISKUS 250-50 MCG/DOSE AEPB, Inhale 1 puff into the lungs 2 (two) times daily at 10 AM and 5 PM. , Disp: , Rfl:    albuterol (PROVENTIL HFA;VENTOLIN HFA) 108 (90 Base) MCG/ACT inhaler, Inhale 1 puff into the lungs every 6 (six) hours as needed for wheezing or shortness of breath., Disp: , Rfl:    B-D INSULIN SYRINGE 29G X 1/2" 0.5 ML MISC, USE 1 SYRINGE SUBCUTANEOUSLY ONCE EVERY MONTH, Disp: , Rfl:    Calcium Carb-Cholecalciferol (CALCIUM 600+D3 PO), Take by mouth daily., Disp: , Rfl:    carvedilol (COREG) 6.25 MG tablet, Take 6.25 mg by mouth 2 (two) times daily. , Disp: , Rfl:    cephALEXin (KEFLEX) 500 MG capsule, Take 500 mg by mouth 3 (three) times daily., Disp: , Rfl:    cyanocobalamin (,VITAMIN B-12,) 1000 MCG/ML injection, Inject 1,000 mcg into the muscle every 30 (thirty) days., Disp: , Rfl:    Dulaglutide (TRULICITY) 1.5 KX/3.8HW SOPN, once a week., Disp: , Rfl:    DULoxetine (CYMBALTA) 30 MG capsule, Take 30 mg by mouth daily., Disp: , Rfl:    ELIQUIS 5 MG TABS tablet, TAKE 1 TABLET TWICE A DAY, Disp: 180 tablet, Rfl: 1   Ergocalciferol (VITAMIN D2 PO), Take by mouth daily., Disp: , Rfl:    famotidine (PEPCID) 20 MG tablet, Take 20 mg by mouth 2 (two) times daily., Disp: , Rfl:    fluticasone (FLONASE) 50 MCG/ACT nasal spray, Place into the nose as needed., Disp: , Rfl:    furosemide (LASIX) 20 MG tablet, Take 20 mg by mouth daily. , Disp: , Rfl:    gabapentin (NEURONTIN) 600 MG tablet, Take 600 mg by mouth every 8 (eight) hours as needed., Disp: , Rfl:    HYDROmorphone (DILAUDID) 4 MG tablet, TAKE 1 TABLET BY MOUTH EVERY 6 TO 8 HOURS AS NEEDED FOR CHRONIC PAIN (MAX 4 PER DAY), Disp: , Rfl: 0   isosorbide  mononitrate (IMDUR) 30 MG 24 hr tablet, Take 1 tablet (30 mg total) by mouth daily., Disp: 90 tablet, Rfl: 2   LEVEMIR FLEXTOUCH 100 UNIT/ML Pen, Inject 50 Units into the skin daily. , Disp: , Rfl:    levothyroxine (SYNTHROID) 50 MCG tablet, Take 50 mcg by mouth daily., Disp: , Rfl:    lisinopril (PRINIVIL,ZESTRIL) 10 MG tablet, Take 10 mg by mouth daily., Disp: , Rfl:    metFORMIN (GLUCOPHAGE) 500 MG tablet, 500 mg daily., Disp: , Rfl:    MOVANTIK 25 MG TABS tablet, , Disp: , Rfl:    nitrofurantoin (MACRODANTIN) 50 MG capsule, Take 50 mg by mouth daily., Disp: , Rfl:    nitroGLYCERIN (NITROSTAT) 0.4  MG SL tablet, Place 0.4 mg under the tongue every 5 (five) minutes as needed for chest pain., Disp: , Rfl:    pravastatin (PRAVACHOL) 80 MG tablet, Take 80 mg by mouth at bedtime. , Disp: , Rfl:    QUEtiapine (SEROQUEL) 200 MG tablet, Take 200 mg by mouth daily. , Disp: , Rfl:    ranolazine (RANEXA) 500 MG 12 hr tablet, Take 1 tablet (500 mg total) by mouth 2 (two) times daily., Disp: 180 tablet, Rfl: 3   SURE COMFORT PEN NEEDLES 32G X 6 MM MISC, , Disp: , Rfl:  Allergies  Allergen Reactions   Codeine    Nsaids Other (See Comments)    GI Bleeding   Tolmetin Other (See Comments)    GI Bleeding   Social History   Tobacco Use  Smoking Status Former Smoker   Packs/day: 1.00   Years: 50.00   Pack years: 50.00   Quit date: 03/1998   Years since quitting: 21.5  Smokeless Tobacco Never Used   Objective:  There were no vitals filed for this visit. Constitutional Patient is a pleasant 86 y.o. Caucasian female WD, WN in NAD.Marland Kitchen AAO x 3.  Vascular Capillary refill time to digits <4 seconds b/l lower extremities. Faintly palpable DP pulse(s) b/l lower extremities. Nonpalpable PT pulse(s) b/l lower extremities. Pedal hair absent. Lower extremity skin temperature gradient warm to cool. No pain with calf compression b/l. No cyanosis or clubbing noted.  Neurologic Normal  speech. Oriented to person, place, and time. Protective sensation diminished with 10g monofilament b/l. Vibratory sensation diminished b/l. Clonus negative b/l.  Dermatologic Pedal skin with normal turgor, texture and tone bilaterally. No open wounds bilaterally. No interdigital macerations bilaterally. Toenails 1-5 b/l elongated, discolored, dystrophic, thickened, crumbly with subungual debris and tenderness to dorsal palpation. Hyperkeratotic lesion(s) L 3rd toe, L 4th toe and R 3rd toe.  No erythema, no edema, no drainage, no flocculence.  Orthopedic: Normal muscle strength 5/5 to all lower extremity muscle groups bilaterally. No pain crepitus or joint limitation noted with ROM b/l. Hammertoes noted to the b/l lower extremities.   Assessment:   1. Pain due to onychomycosis of toenails of both feet   2. Corns   3. Acquired hammertoes of both feet   4. Type II diabetes mellitus with peripheral circulatory disorder [facility])    Plan:  Patient was evaluated and treated and all questions answered.  Onychomycosis with pain -Nails palliatively debridement as below. -Educated on self-care  Procedure: Nail Debridement Rationale: Pain Type of Debridement: manual, sharp debridement. Instrumentation: Nail nipper, rotary burr. Number of Nails: 10  -Examined patient. -Continue diabetic foot care principles. -Toenails 1-5 b/l were debrided in length and girth with sterile nail nippers and dremel without iatrogenic bleeding.  -Corn(s) L 3rd toe, L 4th toe and R 3rd toe pared utilizing sterile scalpel blade without complication or incident. Total number debrided=3. -Patient to report any pedal injuries to medical professional immediately. -Dispensed Silipos digital toe caps. Apply every morning. Remove every evening. -Patient to continue soft, supportive shoe gear daily. -Patient/POA to call should there be question/concern in the interim.  Return in about 10 weeks (around 12/02/2019).  Marzetta Board, DPM

## 2019-10-13 DIAGNOSIS — J019 Acute sinusitis, unspecified: Secondary | ICD-10-CM | POA: Diagnosis not present

## 2019-10-18 DIAGNOSIS — M47816 Spondylosis without myelopathy or radiculopathy, lumbar region: Secondary | ICD-10-CM | POA: Diagnosis not present

## 2019-10-18 DIAGNOSIS — G894 Chronic pain syndrome: Secondary | ICD-10-CM | POA: Diagnosis not present

## 2019-10-18 DIAGNOSIS — M5136 Other intervertebral disc degeneration, lumbar region: Secondary | ICD-10-CM | POA: Diagnosis not present

## 2019-10-18 DIAGNOSIS — Z79891 Long term (current) use of opiate analgesic: Secondary | ICD-10-CM | POA: Diagnosis not present

## 2019-10-18 DIAGNOSIS — M25551 Pain in right hip: Secondary | ICD-10-CM | POA: Diagnosis not present

## 2019-10-18 DIAGNOSIS — M1611 Unilateral primary osteoarthritis, right hip: Secondary | ICD-10-CM | POA: Diagnosis not present

## 2019-10-19 DIAGNOSIS — F419 Anxiety disorder, unspecified: Secondary | ICD-10-CM | POA: Diagnosis not present

## 2019-10-19 DIAGNOSIS — Z20822 Contact with and (suspected) exposure to covid-19: Secondary | ICD-10-CM | POA: Diagnosis not present

## 2019-10-19 DIAGNOSIS — K219 Gastro-esophageal reflux disease without esophagitis: Secondary | ICD-10-CM | POA: Diagnosis not present

## 2019-10-19 DIAGNOSIS — J069 Acute upper respiratory infection, unspecified: Secondary | ICD-10-CM | POA: Diagnosis not present

## 2019-10-19 DIAGNOSIS — I700 Atherosclerosis of aorta: Secondary | ICD-10-CM | POA: Diagnosis not present

## 2019-10-19 DIAGNOSIS — R0602 Shortness of breath: Secondary | ICD-10-CM | POA: Diagnosis not present

## 2019-10-19 DIAGNOSIS — Z79899 Other long term (current) drug therapy: Secondary | ICD-10-CM | POA: Diagnosis not present

## 2019-10-19 DIAGNOSIS — E119 Type 2 diabetes mellitus without complications: Secondary | ICD-10-CM | POA: Diagnosis not present

## 2019-10-19 DIAGNOSIS — I10 Essential (primary) hypertension: Secondary | ICD-10-CM | POA: Diagnosis not present

## 2019-10-19 DIAGNOSIS — Z7984 Long term (current) use of oral hypoglycemic drugs: Secondary | ICD-10-CM | POA: Diagnosis not present

## 2019-10-19 DIAGNOSIS — J449 Chronic obstructive pulmonary disease, unspecified: Secondary | ICD-10-CM | POA: Diagnosis not present

## 2019-10-19 DIAGNOSIS — R079 Chest pain, unspecified: Secondary | ICD-10-CM | POA: Diagnosis not present

## 2019-10-19 DIAGNOSIS — I252 Old myocardial infarction: Secondary | ICD-10-CM | POA: Diagnosis not present

## 2019-10-19 DIAGNOSIS — Z7951 Long term (current) use of inhaled steroids: Secondary | ICD-10-CM | POA: Diagnosis not present

## 2019-10-19 DIAGNOSIS — Z87891 Personal history of nicotine dependence: Secondary | ICD-10-CM | POA: Diagnosis not present

## 2019-10-19 DIAGNOSIS — G8929 Other chronic pain: Secondary | ICD-10-CM | POA: Diagnosis not present

## 2019-10-19 DIAGNOSIS — Z03818 Encounter for observation for suspected exposure to other biological agents ruled out: Secondary | ICD-10-CM | POA: Diagnosis not present

## 2019-10-25 DIAGNOSIS — J309 Allergic rhinitis, unspecified: Secondary | ICD-10-CM | POA: Diagnosis not present

## 2019-10-25 DIAGNOSIS — J4520 Mild intermittent asthma, uncomplicated: Secondary | ICD-10-CM | POA: Diagnosis not present

## 2019-10-25 DIAGNOSIS — E559 Vitamin D deficiency, unspecified: Secondary | ICD-10-CM | POA: Diagnosis not present

## 2019-10-25 DIAGNOSIS — R5383 Other fatigue: Secondary | ICD-10-CM | POA: Diagnosis not present

## 2019-10-25 DIAGNOSIS — J029 Acute pharyngitis, unspecified: Secondary | ICD-10-CM | POA: Diagnosis not present

## 2019-10-25 DIAGNOSIS — G4733 Obstructive sleep apnea (adult) (pediatric): Secondary | ICD-10-CM | POA: Diagnosis not present

## 2019-11-04 ENCOUNTER — Other Ambulatory Visit: Payer: Self-pay

## 2019-11-04 ENCOUNTER — Ambulatory Visit (INDEPENDENT_AMBULATORY_CARE_PROVIDER_SITE_OTHER): Payer: Medicare Other | Admitting: Cardiology

## 2019-11-04 ENCOUNTER — Encounter: Payer: Self-pay | Admitting: Cardiology

## 2019-11-04 VITALS — BP 109/61 | HR 73 | Ht 67.0 in | Wt 178.6 lb

## 2019-11-04 DIAGNOSIS — R296 Repeated falls: Secondary | ICD-10-CM | POA: Diagnosis not present

## 2019-11-04 DIAGNOSIS — I208 Other forms of angina pectoris: Secondary | ICD-10-CM | POA: Diagnosis not present

## 2019-11-04 DIAGNOSIS — I480 Paroxysmal atrial fibrillation: Secondary | ICD-10-CM

## 2019-11-04 DIAGNOSIS — I739 Peripheral vascular disease, unspecified: Secondary | ICD-10-CM

## 2019-11-04 NOTE — Patient Instructions (Signed)
Medication Instructions:  Your physician recommends that you continue on your current medications as directed. Please refer to the Current Medication list given to you today.  *If you need a refill on your cardiac medications before your next appointment, please call your pharmacy*   Lab Work: None. If you have labs (blood work) drawn today and your tests are completely normal, you will receive your results only by: . MyChart Message (if you have MyChart) OR . A paper copy in the mail If you have any lab test that is abnormal or we need to change your treatment, we will call you to review the results.   Testing/Procedures: none   Follow-Up: At [facility], you and your health needs are our priority.  As part of our continuing mission to provide you with exceptional heart care, we have created designated Provider Care Teams.  These Care Teams include your primary Cardiologist (physician) and Advanced Practice Providers (APPs -  Physician Assistants and Nurse Practitioners) who all work together to provide you with the care you need, when you need it.  We recommend signing up for the patient portal called "MyChart".  Sign up information is provided on this After Visit Summary.  MyChart is used to connect with patients for Virtual Visits (Telemedicine).  Patients are able to view lab/test results, encounter notes, upcoming appointments, etc.  Non-urgent messages can be sent to your provider as well.   To learn more about what you can do with MyChart, go to [email or web address].    Your next appointment:   5 month(s)  The format for your next appointment:   In Person  Provider:   Robert Krasowski, MD   Other Instructions   

## 2019-11-04 NOTE — Progress Notes (Signed)
Cardiology Office Note:    Date:  11/04/2019   ID:  LEONARD HENDLER, DOB December 17, 1931, MRN [id number]  PCP:  [name], NP  Cardiologist:  [name], MD    Referring MD: [name], NP   No chief complaint on file. I am doing fine  History of Present Illness:    Shelia Sullivan is a 86 y.o. female with past medical history significant for paroxysmal atrial fibrillation, essential hypertension, dyslipidemia, stable angina pectoris with febrile episodes, she did have a stress test done in 2019 showing very small area of ischemia, she prefers medical therapy and overall seems to be doing well.  When I seen her last about 3 months ago she had 2 episode of fall and after that she did have physical therapy.  She did not passed out, now she is using walker she is very happy she said since that time she did not fall anymore.  Denies have any chest pain, tightness, pressure, burning the chest denies having a palpitation she tells me she cannot feel when she is out of rhythm.  Past Medical History:  Diagnosis Date  . Acute chest pain 06/08/2017   Last Assessment & Plan:  Consistent with angina pectoris brought on by atrial fibrillation with rapid ventricular response.  Nuclear scan intermediate risk for significant obstructive coronary artery disease.  Options discussed:  We will proceed with diagnostic cardiac catheterization but forego intervention on an ad Hoc basis as she does have history of a GI bleed has been told not to take aspiri  . Anemia   . Anxiety 06/08/2017  . Anxiety and depression   . Arthritis   . Asthma   . Asthma   . Atrophic vaginitis   . CAD (coronary artery disease)   . Cancer ([place])   . Chronic pain 06/08/2017  . Chronic respiratory failure ([place]) 06/08/2017  . CKD (chronic kidney disease)   . COPD (chronic obstructive pulmonary disease) ([place])   . Dyslipidemia 06/09/2017   Last Assessment & Plan:  Low HDL with LDL 76. Low threshold for continued statin therapy   . Esophageal reflux   . Fibrocystic breast changes of both breasts 06/27/2015  . History of GI bleed 06/08/2017   Last Assessment & Plan:  Stable at present with reasonable hemoglobin and hematocrit.  However, as noted above will defer consideration dual antiplatelet therapy at the present time due to need for  direct oral anticoagulant therapy given history of the atrial fibrillation pending review of the results from the diagnostic catheterization  . History of MI (myocardial infarction) 06/08/2017  . Hypercholesteremia   . Hypertension   . Hypothyroid   . Irritable bladder   . Kidney stone   . Morbid obesity ([place]) 06/27/2015  . Myocardial infarction ([place])   . Neuropathy   . Nocturia   . Osteoarthritis   . Peripheral vascular disease ([place]) 06/08/2017  . Rapid atrial fibrillation ([place]) 06/08/2017   Last Assessment & Plan:  Back in sinus rhythm at present but chads Vasc score is at least 4 based on age, gender, history of hypertension and diabetes.  Recommend anticoagulant therapy with a direct oral anticoagulant.  . Scars   . Stable angina pectoris ([place]) 06/08/2017   Added automatically from request for surgery [phone]  . Swelling   . Type 2 diabetes mellitus with stage 3 chronic kidney disease ([place])   . Type 2 diabetes mellitus, without long-term current use of insulin ([place]) 06/08/2017   Last Assessment &  Plan:  Target A1c less than 7.0  . Vitamin D deficiency     Past Surgical History:  Procedure Laterality Date  . APPENDECTOMY  1950  . ARTHROPLASTY Right 2002   Dr Reeves Forth  . Arthroscopy of shoulder    . BREAST SURGERY    . CARPAL TUNNEL RELEASE Left 2007  . CATARACT EXTRACTION Bilateral   . CHOLECYSTECTOMY  1980  . HERNIA REPAIR  1985  . LITHOTRIPSY  02/21/2017  . PARTIAL HYSTERECTOMY  1950  . REPLACEMENT TOTAL KNEE Left 02/03/2012  . TONSILLECTOMY  1940    Current Medications: Current Meds  Medication Sig  . ADVAIR DISKUS 250-50 MCG/DOSE AEPB Inhale 1 puff into the lungs 2  (two) times daily at 10 AM and 5 PM.   . albuterol (PROVENTIL HFA;VENTOLIN HFA) 108 (90 Base) MCG/ACT inhaler Inhale 1 puff into the lungs every 6 (six) hours as needed for wheezing or shortness of breath.  . B-D INSULIN SYRINGE 29G X 1/2" 0.5 ML MISC USE 1 SYRINGE SUBCUTANEOUSLY ONCE EVERY MONTH  . Calcium Carb-Cholecalciferol (CALCIUM 600+D3 PO) Take by mouth daily.  . carvedilol (COREG) 6.25 MG tablet Take 6.25 mg by mouth 2 (two) times daily.   . cephALEXin (KEFLEX) 500 MG capsule Take 500 mg by mouth 3 (three) times daily.  . cyanocobalamin (,VITAMIN B-12,) 1000 MCG/ML injection Inject 1,000 mcg into the muscle every 30 (thirty) days.  . DULoxetine (CYMBALTA) 30 MG capsule Take 30 mg by mouth daily.  Marland Kitchen ELIQUIS 5 MG TABS tablet TAKE 1 TABLET TWICE A DAY  . famotidine (PEPCID) 20 MG tablet Take 20 mg by mouth 2 (two) times daily.  . fluticasone (FLONASE) 50 MCG/ACT nasal spray Place into the nose as needed.  . gabapentin (NEURONTIN) 600 MG tablet Take 600 mg by mouth every 8 (eight) hours as needed.  Marland Kitchen HYDROmorphone (DILAUDID) 4 MG tablet TAKE 1 TABLET BY MOUTH EVERY 6 TO 8 HOURS AS NEEDED FOR CHRONIC PAIN (MAX 4 PER DAY)  . isosorbide mononitrate (IMDUR) 30 MG 24 hr tablet Take 1 tablet (30 mg total) by mouth daily.  Marland Kitchen LEVEMIR FLEXTOUCH 100 UNIT/ML Pen Inject 50 Units into the skin daily.   Marland Kitchen levothyroxine (SYNTHROID) 50 MCG tablet Take 50 mcg by mouth daily.  Marland Kitchen lisinopril (ZESTRIL) 5 MG tablet Take 5 mg by mouth daily.  . metFORMIN (GLUCOPHAGE) 500 MG tablet 500 mg daily.  Marland Kitchen MOVANTIK 25 MG TABS tablet   . nitrofurantoin (MACRODANTIN) 50 MG capsule Take 50 mg by mouth daily.  . nitroGLYCERIN (NITROSTAT) 0.4 MG SL tablet Place 0.4 mg under the tongue every 5 (five) minutes as needed for chest pain.  Marland Kitchen omeprazole (PRILOSEC) 40 MG capsule Take 40 mg by mouth daily.  . pravastatin (PRAVACHOL) 80 MG tablet Take 80 mg by mouth at bedtime.   . ranolazine (RANEXA) 500 MG 12 hr tablet Take 1 tablet  (500 mg total) by mouth 2 (two) times daily.  . solifenacin (VESICARE) 10 MG tablet Take 10 mg by mouth at bedtime.  . SURE COMFORT PEN NEEDLES 32G X 6 MM MISC   . TRULICITY 1.02 HE/5.2DP SOPN Inject 1.5 mLs into the skin once a week.  . Vitamin D, Ergocalciferol, (DRISDOL) 1.25 MG (50000 UNIT) CAPS capsule Take 50,000 Units by mouth once a week.     Allergies:   Codeine, Nsaids, and Tolmetin   Social History   Socioeconomic History  . Marital status: Widowed    Spouse name: Not on file  .  Number of children: 4  . Years of education: Not on file  . Highest education level: Not on file  Occupational History  . Not on file  Tobacco Use  . Smoking status: Former Smoker    Packs/day: 1.00    Years: 50.00    Pack years: 50.00    Quit date: 03/1998    Years since quitting: 21.6  . Smokeless tobacco: Never Used  Vaping Use  . Vaping Use: Never used  Substance and Sexual Activity  . Alcohol use: No  . Drug use: No  . Sexual activity: Not Currently  Other Topics Concern  . Not on file  Social History Narrative   Daughter and grandson live with patient.   Social Determinants of Health   Financial Resource Strain:   . Difficulty of Paying Living Expenses: Not on file  Food Insecurity:   . Worried About Charity fundraiser in the Last Year: Not on file  . Ran Out of Food in the Last Year: Not on file  Transportation Needs:   . Lack of Transportation (Medical): Not on file  . Lack of Transportation (Non-Medical): Not on file  Physical Activity:   . Days of Exercise per Week: Not on file  . Minutes of Exercise per Session: Not on file  Stress:   . Feeling of Stress : Not on file  Social Connections:   . Frequency of Communication with Friends and Family: Not on file  . Frequency of Social Gatherings with Friends and Family: Not on file  . Attends Religious Services: Not on file  . Active Member of Clubs or Organizations: Not on file  . Attends Archivist  Meetings: Not on file  . Marital Status: Not on file     Family History: The patient's family history includes Arthritis in her father; Breast cancer in her daughter; Colon cancer in her father; Diabetes in her sister; Heart disease in her father; Hyperlipidemia in her mother; Hypertension in her father and mother; Prostate cancer in her father; Stroke in her father. ROS:   Please see the history of present illness.    All 14 point review of systems negative except as described per history of present illness  EKGs/Labs/Other Studies Reviewed:      Recent Labs: No results found for requested labs within last 8760 hours.  Recent Lipid Panel    Component Value Date/Time   CHOL 151 09/02/2013 1300   TRIG 171 (H) 09/02/2013 1300   HDL 25 (L) 09/02/2013 1300   CHOLHDL 6.0 09/02/2013 1300   VLDL 34 09/02/2013 1300   LDLCALC 92 09/02/2013 1300    Physical Exam:    VS:  BP 109/61   Pulse 73   Ht 5\' 7"  (1.702 m)   Wt 178 lb 9.6 oz (81 kg)   SpO2 93%   BMI 27.97 kg/m     Wt Readings from Last 3 Encounters:  11/04/19 178 lb 9.6 oz (81 kg)  07/27/19 172 lb 6.4 oz (78.2 kg)  04/26/19 183 lb (83 kg)     GEN:  Well nourished, well developed in no acute distress HEENT: Normal NECK: No JVD; No carotid bruits LYMPHATICS: No lymphadenopathy CARDIAC: RRR, no murmurs, no rubs, no gallops RESPIRATORY:  Clear to auscultation without rales, wheezing or rhonchi  ABDOMEN: Soft, non-tender, non-distended MUSCULOSKELETAL:  No edema; No deformity  SKIN: Warm and dry LOWER EXTREMITIES: no swelling NEUROLOGIC:  Alert and oriented x 3 PSYCHIATRIC:  Normal affect  ASSESSMENT:    1. Paroxysmal atrial fibrillation (HCC)   2. Frequent falls   3. Stable angina pectoris ([place])   4. Peripheral vascular disease ([place])    PLAN:    In order of problems listed above:  1. Paroxysmal atrial fibrillation: Sinus rhythm today checked by EKG.  We will continue present management which include  anticoagulation, her chads 2 Vascor equals 4. 2. Frequent falls now she is walking there is no more falls.  Doing well. 3. Stable angina pectoris denies having any. 4. Peripheral vascular disease.  Stable. 5. Dyslipidemia last fasting lipid profile reviewed stable good number we will continue with pravastatin 80.   Medication Adjustments/Labs and Tests Ordered: Current medicines are reviewed at length with the patient today.  Concerns regarding medicines are outlined above.  Orders Placed This Encounter  Procedures  . EKG 12-Lead   Medication changes: No orders of the defined types were placed in this encounter.   Signed, Park Liter, MD, [facility] 11/04/2019 3:26 PM    [place]

## 2019-11-08 DIAGNOSIS — R1900 Intra-abdominal and pelvic swelling, mass and lump, unspecified site: Secondary | ICD-10-CM | POA: Diagnosis not present

## 2019-11-08 DIAGNOSIS — E039 Hypothyroidism, unspecified: Secondary | ICD-10-CM | POA: Diagnosis not present

## 2019-11-15 DIAGNOSIS — G894 Chronic pain syndrome: Secondary | ICD-10-CM | POA: Diagnosis not present

## 2019-11-15 DIAGNOSIS — M1611 Unilateral primary osteoarthritis, right hip: Secondary | ICD-10-CM | POA: Diagnosis not present

## 2019-11-15 DIAGNOSIS — M47816 Spondylosis without myelopathy or radiculopathy, lumbar region: Secondary | ICD-10-CM | POA: Diagnosis not present

## 2019-11-15 DIAGNOSIS — Z79891 Long term (current) use of opiate analgesic: Secondary | ICD-10-CM | POA: Diagnosis not present

## 2019-11-15 DIAGNOSIS — M25551 Pain in right hip: Secondary | ICD-10-CM | POA: Diagnosis not present

## 2019-11-15 DIAGNOSIS — M5136 Other intervertebral disc degeneration, lumbar region: Secondary | ICD-10-CM | POA: Diagnosis not present

## 2019-11-22 DIAGNOSIS — R404 Transient alteration of awareness: Secondary | ICD-10-CM | POA: Diagnosis not present

## 2019-11-22 DIAGNOSIS — E11649 Type 2 diabetes mellitus with hypoglycemia without coma: Secondary | ICD-10-CM | POA: Diagnosis not present

## 2019-11-22 DIAGNOSIS — R410 Disorientation, unspecified: Secondary | ICD-10-CM | POA: Diagnosis not present

## 2019-11-22 DIAGNOSIS — R4182 Altered mental status, unspecified: Secondary | ICD-10-CM | POA: Diagnosis not present

## 2019-11-22 DIAGNOSIS — E162 Hypoglycemia, unspecified: Secondary | ICD-10-CM | POA: Diagnosis not present

## 2019-11-22 DIAGNOSIS — E1165 Type 2 diabetes mellitus with hyperglycemia: Secondary | ICD-10-CM | POA: Diagnosis not present

## 2019-11-22 DIAGNOSIS — E161 Other hypoglycemia: Secondary | ICD-10-CM | POA: Diagnosis not present

## 2019-11-26 DIAGNOSIS — M545 Low back pain, unspecified: Secondary | ICD-10-CM | POA: Diagnosis not present

## 2019-11-26 DIAGNOSIS — K219 Gastro-esophageal reflux disease without esophagitis: Secondary | ICD-10-CM | POA: Diagnosis not present

## 2019-11-26 DIAGNOSIS — I252 Old myocardial infarction: Secondary | ICD-10-CM | POA: Diagnosis not present

## 2019-11-26 DIAGNOSIS — M25551 Pain in right hip: Secondary | ICD-10-CM | POA: Diagnosis not present

## 2019-11-26 DIAGNOSIS — Z7901 Long term (current) use of anticoagulants: Secondary | ICD-10-CM | POA: Diagnosis not present

## 2019-11-26 DIAGNOSIS — Z79891 Long term (current) use of opiate analgesic: Secondary | ICD-10-CM | POA: Diagnosis not present

## 2019-11-26 DIAGNOSIS — E1151 Type 2 diabetes mellitus with diabetic peripheral angiopathy without gangrene: Secondary | ICD-10-CM | POA: Diagnosis not present

## 2019-11-26 DIAGNOSIS — R404 Transient alteration of awareness: Secondary | ICD-10-CM | POA: Diagnosis not present

## 2019-11-26 DIAGNOSIS — M542 Cervicalgia: Secondary | ICD-10-CM | POA: Diagnosis not present

## 2019-11-26 DIAGNOSIS — M549 Dorsalgia, unspecified: Secondary | ICD-10-CM | POA: Diagnosis not present

## 2019-11-26 DIAGNOSIS — E161 Other hypoglycemia: Secondary | ICD-10-CM | POA: Diagnosis not present

## 2019-11-26 DIAGNOSIS — E162 Hypoglycemia, unspecified: Secondary | ICD-10-CM | POA: Diagnosis not present

## 2019-11-26 DIAGNOSIS — I10 Essential (primary) hypertension: Secondary | ICD-10-CM | POA: Diagnosis not present

## 2019-11-26 DIAGNOSIS — J9610 Chronic respiratory failure, unspecified whether with hypoxia or hypercapnia: Secondary | ICD-10-CM | POA: Diagnosis not present

## 2019-11-26 DIAGNOSIS — M47816 Spondylosis without myelopathy or radiculopathy, lumbar region: Secondary | ICD-10-CM | POA: Diagnosis not present

## 2019-11-26 DIAGNOSIS — Z9981 Dependence on supplemental oxygen: Secondary | ICD-10-CM | POA: Diagnosis not present

## 2019-11-26 DIAGNOSIS — S199XXA Unspecified injury of neck, initial encounter: Secondary | ICD-10-CM | POA: Diagnosis not present

## 2019-11-26 DIAGNOSIS — G8929 Other chronic pain: Secondary | ICD-10-CM | POA: Diagnosis not present

## 2019-11-26 DIAGNOSIS — H3530 Unspecified macular degeneration: Secondary | ICD-10-CM | POA: Diagnosis not present

## 2019-11-26 DIAGNOSIS — S0990XA Unspecified injury of head, initial encounter: Secondary | ICD-10-CM | POA: Diagnosis not present

## 2019-11-26 DIAGNOSIS — M419 Scoliosis, unspecified: Secondary | ICD-10-CM | POA: Diagnosis not present

## 2019-11-26 DIAGNOSIS — J449 Chronic obstructive pulmonary disease, unspecified: Secondary | ICD-10-CM | POA: Diagnosis not present

## 2019-11-26 DIAGNOSIS — G319 Degenerative disease of nervous system, unspecified: Secondary | ICD-10-CM | POA: Diagnosis not present

## 2019-11-26 DIAGNOSIS — M1611 Unilateral primary osteoarthritis, right hip: Secondary | ICD-10-CM | POA: Diagnosis not present

## 2019-11-26 DIAGNOSIS — Z87891 Personal history of nicotine dependence: Secondary | ICD-10-CM | POA: Diagnosis not present

## 2019-11-26 DIAGNOSIS — R52 Pain, unspecified: Secondary | ICD-10-CM | POA: Diagnosis not present

## 2019-11-29 DIAGNOSIS — E559 Vitamin D deficiency, unspecified: Secondary | ICD-10-CM | POA: Diagnosis not present

## 2019-11-29 DIAGNOSIS — J4520 Mild intermittent asthma, uncomplicated: Secondary | ICD-10-CM | POA: Diagnosis not present

## 2019-11-29 DIAGNOSIS — J309 Allergic rhinitis, unspecified: Secondary | ICD-10-CM | POA: Diagnosis not present

## 2019-11-29 DIAGNOSIS — R5383 Other fatigue: Secondary | ICD-10-CM | POA: Diagnosis not present

## 2019-11-29 DIAGNOSIS — G4733 Obstructive sleep apnea (adult) (pediatric): Secondary | ICD-10-CM | POA: Diagnosis not present

## 2019-12-02 ENCOUNTER — Other Ambulatory Visit: Payer: Self-pay

## 2019-12-02 ENCOUNTER — Ambulatory Visit (INDEPENDENT_AMBULATORY_CARE_PROVIDER_SITE_OTHER): Payer: Medicare Other | Admitting: Podiatry

## 2019-12-02 ENCOUNTER — Encounter: Payer: Self-pay | Admitting: Podiatry

## 2019-12-02 DIAGNOSIS — B351 Tinea unguium: Secondary | ICD-10-CM | POA: Diagnosis not present

## 2019-12-02 DIAGNOSIS — M79674 Pain in right toe(s): Secondary | ICD-10-CM

## 2019-12-02 DIAGNOSIS — L84 Corns and callosities: Secondary | ICD-10-CM | POA: Diagnosis not present

## 2019-12-02 DIAGNOSIS — M2041 Other hammer toe(s) (acquired), right foot: Secondary | ICD-10-CM

## 2019-12-02 DIAGNOSIS — M79675 Pain in left toe(s): Secondary | ICD-10-CM | POA: Diagnosis not present

## 2019-12-02 DIAGNOSIS — E1151 Type 2 diabetes mellitus with diabetic peripheral angiopathy without gangrene: Secondary | ICD-10-CM

## 2019-12-02 DIAGNOSIS — M2042 Other hammer toe(s) (acquired), left foot: Secondary | ICD-10-CM

## 2019-12-05 NOTE — Progress Notes (Signed)
Subjective:  Patient ID: Shelia Sullivan, female    DOB: February 27, 1931,  MRN: [id number]  86 y.o. female presents with at risk foot care. Pt has h/o NIDDM with PAD and painful corn(s) b/l 3rd digits and left 4th digit  which interfere(s) with ambulation. Aggravating factors include wearing enclosed shoe gear. Pain is relieved with periodic professional debridement.   She voices no new pedal problems on today's visit.  PCP is Orlinda Blalock, NP; last visit was 04/28/2019.  Review of Systems: Negative except as noted in the HPI.  Past Medical History:  Diagnosis Date  . Acute chest pain 06/08/2017   Last Assessment & Plan:  Consistent with angina pectoris brought on by atrial fibrillation with rapid ventricular response.  Nuclear scan intermediate risk for significant obstructive coronary artery disease.  Options discussed:  We will proceed with diagnostic cardiac catheterization but forego intervention on an ad Hoc basis as she does have history of a GI bleed has been told not to take aspiri  . Anemia   . Anxiety 06/08/2017  . Anxiety and depression   . Arthritis   . Asthma   . Asthma   . Atrophic vaginitis   . CAD (coronary artery disease)   . Cancer ([place])   . Chronic pain 06/08/2017  . Chronic respiratory failure ([place]) 06/08/2017  . CKD (chronic kidney disease)   . COPD (chronic obstructive pulmonary disease) ([place])   . Dyslipidemia 06/09/2017   Last Assessment & Plan:  Low HDL with LDL 76. Low threshold for continued statin therapy  . Esophageal reflux   . Fibrocystic breast changes of both breasts 06/27/2015  . History of GI bleed 06/08/2017   Last Assessment & Plan:  Stable at present with reasonable hemoglobin and hematocrit.  However, as noted above will defer consideration dual antiplatelet therapy at the present time due to need for  direct oral anticoagulant therapy given history of the atrial fibrillation pending review of the results from the diagnostic catheterization  . History of MI  (myocardial infarction) 06/08/2017  . Hypercholesteremia   . Hypertension   . Hypothyroid   . Irritable bladder   . Kidney stone   . Morbid obesity ([place]) 06/27/2015  . Myocardial infarction ([place])   . Neuropathy   . Nocturia   . Osteoarthritis   . Peripheral vascular disease ([place]) 06/08/2017  . Rapid atrial fibrillation ([place]) 06/08/2017   Last Assessment & Plan:  Back in sinus rhythm at present but chads Vasc score is at least 4 based on age, gender, history of hypertension and diabetes.  Recommend anticoagulant therapy with a direct oral anticoagulant.  . Scars   . Stable angina pectoris ([place]) 06/08/2017   Added automatically from request for surgery [phone]  . Swelling   . Type 2 diabetes mellitus with stage 3 chronic kidney disease ([place])   . Type 2 diabetes mellitus, without long-term current use of insulin ([place]) 06/08/2017   Last Assessment & Plan:  Target A1c less than 7.0  . Vitamin D deficiency    Past Surgical History:  Procedure Laterality Date  . APPENDECTOMY  1950  . ARTHROPLASTY Right 2002   Dr Reeves Forth  . Arthroscopy of shoulder    . BREAST SURGERY    . CARPAL TUNNEL RELEASE Left 2007  . CATARACT EXTRACTION Bilateral   . CHOLECYSTECTOMY  1980  . HERNIA REPAIR  1985  . LITHOTRIPSY  02/21/2017  . PARTIAL HYSTERECTOMY  1950  . REPLACEMENT TOTAL KNEE Left 02/03/2012  . TONSILLECTOMY  1940   Patient Active Problem List   Diagnosis Date Noted  . Frequent falls 07/27/2019  . Hematuria 01/06/2018  . Paroxysmal atrial fibrillation ([place]) 09/30/2017  . Dyslipidemia 06/09/2017  . Type 2 diabetes mellitus, without long-term current use of insulin ([place]) 06/08/2017  . Stable angina pectoris ([place]) 06/08/2017  . Rapid atrial fibrillation ([place]) 06/08/2017  . Peripheral vascular disease ([place]) 06/08/2017  . History of GI bleed 06/08/2017  . History of MI (myocardial infarction) 06/08/2017  . Kidney stone 06/08/2017  . Chronic respiratory failure ([place]) 06/08/2017  . Chronic pain  06/08/2017  . Asthma 06/08/2017  . Anxiety 06/08/2017  . Acute chest pain 06/08/2017  . Morbid obesity ([place]) 06/27/2015  . Fibrocystic breast changes of both breasts 06/27/2015    Current Outpatient Medications:  .  ADVAIR DISKUS 250-50 MCG/DOSE AEPB, Inhale 1 puff into the lungs 2 (two) times daily at 10 AM and 5 PM. , Disp: , Rfl:  .  albuterol (PROVENTIL HFA;VENTOLIN HFA) 108 (90 Base) MCG/ACT inhaler, Inhale 1 puff into the lungs every 6 (six) hours as needed for wheezing or shortness of breath., Disp: , Rfl:  .  B-D INSULIN SYRINGE 29G X 1/2" 0.5 ML MISC, USE 1 SYRINGE SUBCUTANEOUSLY ONCE EVERY MONTH, Disp: , Rfl:  .  Calcium Carb-Cholecalciferol (CALCIUM 600+D3 PO), Take by mouth daily., Disp: , Rfl:  .  carvedilol (COREG) 6.25 MG tablet, Take 6.25 mg by mouth 2 (two) times daily. , Disp: , Rfl:  .  cephALEXin (KEFLEX) 500 MG capsule, Take 500 mg by mouth 3 (three) times daily., Disp: , Rfl:  .  cyanocobalamin (,VITAMIN B-12,) 1000 MCG/ML injection, Inject 1,000 mcg into the muscle every 30 (thirty) days., Disp: , Rfl:  .  DULoxetine (CYMBALTA) 30 MG capsule, Take 30 mg by mouth daily., Disp: , Rfl:  .  ELIQUIS 5 MG TABS tablet, TAKE 1 TABLET TWICE A DAY, Disp: 180 tablet, Rfl: 1 .  famotidine (PEPCID) 20 MG tablet, Take 20 mg by mouth 2 (two) times daily., Disp: , Rfl:  .  fluticasone (FLONASE) 50 MCG/ACT nasal spray, Place into the nose as needed., Disp: , Rfl:  .  gabapentin (NEURONTIN) 600 MG tablet, Take 600 mg by mouth every 8 (eight) hours as needed., Disp: , Rfl:  .  HYDROmorphone (DILAUDID) 4 MG tablet, TAKE 1 TABLET BY MOUTH EVERY 6 TO 8 HOURS AS NEEDED FOR CHRONIC PAIN (MAX 4 PER DAY), Disp: , Rfl: 0 .  isosorbide mononitrate (IMDUR) 30 MG 24 hr tablet, Take 1 tablet (30 mg total) by mouth daily., Disp: 90 tablet, Rfl: 2 .  LEVEMIR FLEXTOUCH 100 UNIT/ML Pen, Inject 50 Units into the skin daily. , Disp: , Rfl:  .  levothyroxine (SYNTHROID) 50 MCG tablet, Take 50 mcg by mouth  daily., Disp: , Rfl:  .  lisinopril (ZESTRIL) 5 MG tablet, Take 5 mg by mouth daily., Disp: , Rfl:  .  metFORMIN (GLUCOPHAGE) 500 MG tablet, 500 mg daily., Disp: , Rfl:  .  MOVANTIK 25 MG TABS tablet, , Disp: , Rfl:  .  nitrofurantoin (MACRODANTIN) 50 MG capsule, Take 50 mg by mouth daily., Disp: , Rfl:  .  nitroGLYCERIN (NITROSTAT) 0.4 MG SL tablet, Place 0.4 mg under the tongue every 5 (five) minutes as needed for chest pain., Disp: , Rfl:  .  omeprazole (PRILOSEC) 40 MG capsule, Take 40 mg by mouth daily., Disp: , Rfl:  .  pravastatin (PRAVACHOL) 80 MG tablet, Take 80 mg by mouth at  bedtime. , Disp: , Rfl:  .  ranolazine (RANEXA) 500 MG 12 hr tablet, Take 1 tablet (500 mg total) by mouth 2 (two) times daily., Disp: 180 tablet, Rfl: 3 .  solifenacin (VESICARE) 10 MG tablet, Take 10 mg by mouth at bedtime., Disp: , Rfl:  .  SURE COMFORT PEN NEEDLES 32G X 6 MM MISC, , Disp: , Rfl:  .  TRULICITY 0.93 AT/5.5DD SOPN, Inject 1.5 mLs into the skin once a week., Disp: , Rfl:  .  Vitamin D, Ergocalciferol, (DRISDOL) 1.25 MG (50000 UNIT) CAPS capsule, Take 50,000 Units by mouth once a week., Disp: , Rfl:  Allergies  Allergen Reactions  . Codeine   . Nsaids Other (See Comments)    GI Bleeding  . Tolmetin Other (See Comments)    GI Bleeding   Social History   Tobacco Use  Smoking Status Former Smoker  . Packs/day: 1.00  . Years: 50.00  . Pack years: 50.00  . Quit date: 03/1998  . Years since quitting: 21.7  Smokeless Tobacco Never Used   Objective:  There were no vitals filed for this visit. Constitutional Patient is a pleasant 86 y.o. Caucasian female WD, WN in NAD.Marland Kitchen AAO x 3.  Vascular Capillary refill time to digits <4 seconds b/l lower extremities. Faintly palpable DP pulse(s) b/l lower extremities. Nonpalpable PT pulse(s) b/l lower extremities. Pedal hair absent. Lower extremity skin temperature gradient warm to cool. No pain with calf compression b/l. No cyanosis or clubbing noted.   Neurologic Normal speech. Oriented to person, place, and time. Protective sensation diminished with 10g monofilament b/l. Vibratory sensation diminished b/l. Clonus negative b/l.  Dermatologic Pedal skin with normal turgor, texture and tone bilaterally. No open wounds bilaterally. No interdigital macerations bilaterally. Toenails 1-5 b/l elongated, discolored, dystrophic, thickened, crumbly with subungual debris and tenderness to dorsal palpation. Hyperkeratotic lesion(s) R 3rd toe.  Resolved lesions L 3rd toe and left 4th toeNo erythema, no edema, no drainage, no flocculence.  Orthopedic: Normal muscle strength 5/5 to all lower extremity muscle groups bilaterally. No pain crepitus or joint limitation noted with ROM b/l. Hammertoes noted to the b/l lower extremities.   Assessment:   1. Pain due to onychomycosis of toenails of both feet   2. Corns   3. Acquired hammertoes of both feet   4. Type II diabetes mellitus with peripheral circulatory disorder [facility] [place])    Plan:  Patient was evaluated and treated and all questions answered.  Onychomycosis with pain -Nails palliatively debridement as below. -Educated on self-care  Procedure: Nail Debridement Rationale: Pain Type of Debridement: manual, sharp debridement. Instrumentation: Nail nipper, rotary burr. Number of Nails: 10  -Examined patient. -Continue diabetic foot care principles. -Toenails 1-5 b/l were debrided in length and girth with sterile nail nippers and dremel without iatrogenic bleeding.  -Corn(s) R 3rd toe pared utilizing sterile scalpel blade without complication or incident. Total number debrided=1. -Patient to report any pedal injuries to medical professional immediately. -Dispensed gel toe crest for right foot. Apply every morning. Remove every evening.Discussed with son-in-law.  -Patient to continue soft, supportive shoe gear daily. -Patient/POA to call should there be question/concern in the interim.  Return in about  10 weeks (around 02/10/2020).  Marzetta Board, DPM

## 2019-12-13 DIAGNOSIS — E663 Overweight: Secondary | ICD-10-CM | POA: Diagnosis not present

## 2019-12-13 DIAGNOSIS — M5136 Other intervertebral disc degeneration, lumbar region: Secondary | ICD-10-CM | POA: Diagnosis not present

## 2019-12-13 DIAGNOSIS — M25551 Pain in right hip: Secondary | ICD-10-CM | POA: Diagnosis not present

## 2019-12-13 DIAGNOSIS — G894 Chronic pain syndrome: Secondary | ICD-10-CM | POA: Diagnosis not present

## 2019-12-13 DIAGNOSIS — Z79891 Long term (current) use of opiate analgesic: Secondary | ICD-10-CM | POA: Diagnosis not present

## 2019-12-13 DIAGNOSIS — M1611 Unilateral primary osteoarthritis, right hip: Secondary | ICD-10-CM | POA: Diagnosis not present

## 2019-12-13 DIAGNOSIS — M47816 Spondylosis without myelopathy or radiculopathy, lumbar region: Secondary | ICD-10-CM | POA: Diagnosis not present

## 2019-12-21 ENCOUNTER — Other Ambulatory Visit: Payer: Self-pay | Admitting: Cardiology

## 2019-12-21 DIAGNOSIS — I480 Paroxysmal atrial fibrillation: Secondary | ICD-10-CM

## 2019-12-21 NOTE — Telephone Encounter (Signed)
Prescription refill request for Eliquis received. Indication: a fib Last office visit: 11/04/19 Scr: 0.85 Age: 86 Weight: 81kg

## 2019-12-28 DIAGNOSIS — Z741 Need for assistance with personal care: Secondary | ICD-10-CM | POA: Diagnosis not present

## 2019-12-28 DIAGNOSIS — R634 Abnormal weight loss: Secondary | ICD-10-CM | POA: Diagnosis not present

## 2019-12-28 DIAGNOSIS — E1122 Type 2 diabetes mellitus with diabetic chronic kidney disease: Secondary | ICD-10-CM | POA: Diagnosis not present

## 2019-12-28 DIAGNOSIS — N183 Chronic kidney disease, stage 3 unspecified: Secondary | ICD-10-CM | POA: Diagnosis not present

## 2019-12-28 DIAGNOSIS — E039 Hypothyroidism, unspecified: Secondary | ICD-10-CM | POA: Diagnosis not present

## 2019-12-28 DIAGNOSIS — R29898 Other symptoms and signs involving the musculoskeletal system: Secondary | ICD-10-CM | POA: Diagnosis not present

## 2019-12-29 DIAGNOSIS — N390 Urinary tract infection, site not specified: Secondary | ICD-10-CM | POA: Diagnosis not present

## 2019-12-29 DIAGNOSIS — N200 Calculus of kidney: Secondary | ICD-10-CM | POA: Diagnosis not present

## 2019-12-29 DIAGNOSIS — N3289 Other specified disorders of bladder: Secondary | ICD-10-CM | POA: Diagnosis not present

## 2020-01-04 DIAGNOSIS — M47816 Spondylosis without myelopathy or radiculopathy, lumbar region: Secondary | ICD-10-CM | POA: Diagnosis not present

## 2020-01-04 DIAGNOSIS — N200 Calculus of kidney: Secondary | ICD-10-CM | POA: Diagnosis not present

## 2020-01-04 DIAGNOSIS — M1611 Unilateral primary osteoarthritis, right hip: Secondary | ICD-10-CM | POA: Diagnosis not present

## 2020-01-04 DIAGNOSIS — Z87442 Personal history of urinary calculi: Secondary | ICD-10-CM | POA: Diagnosis not present

## 2020-01-13 ENCOUNTER — Telehealth: Payer: Self-pay

## 2020-01-13 DIAGNOSIS — M25551 Pain in right hip: Secondary | ICD-10-CM | POA: Diagnosis not present

## 2020-01-13 DIAGNOSIS — M1611 Unilateral primary osteoarthritis, right hip: Secondary | ICD-10-CM | POA: Diagnosis not present

## 2020-01-13 DIAGNOSIS — M5136 Other intervertebral disc degeneration, lumbar region: Secondary | ICD-10-CM | POA: Diagnosis not present

## 2020-01-13 DIAGNOSIS — M47816 Spondylosis without myelopathy or radiculopathy, lumbar region: Secondary | ICD-10-CM | POA: Diagnosis not present

## 2020-01-13 DIAGNOSIS — G894 Chronic pain syndrome: Secondary | ICD-10-CM | POA: Diagnosis not present

## 2020-01-13 DIAGNOSIS — E663 Overweight: Secondary | ICD-10-CM | POA: Diagnosis not present

## 2020-01-13 DIAGNOSIS — Z79891 Long term (current) use of opiate analgesic: Secondary | ICD-10-CM | POA: Diagnosis not present

## 2020-01-13 NOTE — Telephone Encounter (Signed)
   [facility] Pre-operative Risk Assessment    Request for surgical clearance:  1. What type of surgery is being performed? Steroid Injection   2. When is this surgery scheduled? TBD  3. What type of clearance is required (medical clearance vs. Pharmacy clearance to hold med vs. Both)? Pharmacy Clearance  4. Are there any medications that need to be held prior to surgery and how long?Eliquis 3 days prior  5. Practice name and name of physician performing surgery? [facility]   6. What is your office phone number: N/A    7.   What is your office fax number [phone]  8.   Anesthesia type (None, local, MAC, general) ? Not specified   Shelia Sullivan Shelia Sullivan 01/13/2020, 4:10 PM  _________________________________________________________________   (provider comments below)

## 2020-01-14 NOTE — Telephone Encounter (Signed)
   Primary Cardiologist: [name], MD  Chart reviewed as part of pre-operative protocol coverage.   Per pharmacy recommendations, patient can hold eliquis 3 days prior to er upcoming steroid injection with plans to restart as soon as she is cleared to do so.   I will route this recommendation to the requesting party via Epic fax function and remove from pre-op pool.  Please call with questions.  Abigail Butts, PA-C 01/14/2020, 10:46 AM

## 2020-01-14 NOTE — Telephone Encounter (Signed)
Patient with diagnosis of afib on Eliquis for anticoagulation.    Procedure: Steroid Injection  Date of procedure: TBD    CHA2DS2-VASc Score = 6  This indicates a 9.7% annual risk of stroke. The patient's score is based upon: CHF History: 0 HTN History: 1 Diabetes History: 1 Stroke History: 0 Vascular Disease History: 1 Age Score: 2 Gender Score: 1   CrCl 44 ml/min  Per office protocol, patient can hold Eliquis for 3 days prior to procedure.

## 2020-01-17 DIAGNOSIS — E876 Hypokalemia: Secondary | ICD-10-CM | POA: Diagnosis not present

## 2020-01-17 DIAGNOSIS — Z7901 Long term (current) use of anticoagulants: Secondary | ICD-10-CM | POA: Diagnosis not present

## 2020-01-17 DIAGNOSIS — I10 Essential (primary) hypertension: Secondary | ICD-10-CM | POA: Diagnosis not present

## 2020-01-17 DIAGNOSIS — E162 Hypoglycemia, unspecified: Secondary | ICD-10-CM | POA: Diagnosis not present

## 2020-01-17 DIAGNOSIS — J9610 Chronic respiratory failure, unspecified whether with hypoxia or hypercapnia: Secondary | ICD-10-CM | POA: Diagnosis not present

## 2020-01-17 DIAGNOSIS — H3530 Unspecified macular degeneration: Secondary | ICD-10-CM | POA: Diagnosis not present

## 2020-01-17 DIAGNOSIS — I4891 Unspecified atrial fibrillation: Secondary | ICD-10-CM | POA: Diagnosis not present

## 2020-01-17 DIAGNOSIS — R4182 Altered mental status, unspecified: Secondary | ICD-10-CM | POA: Diagnosis not present

## 2020-01-17 DIAGNOSIS — I252 Old myocardial infarction: Secondary | ICD-10-CM | POA: Diagnosis not present

## 2020-01-17 DIAGNOSIS — G8929 Other chronic pain: Secondary | ICD-10-CM | POA: Diagnosis not present

## 2020-01-17 DIAGNOSIS — Z9981 Dependence on supplemental oxygen: Secondary | ICD-10-CM | POA: Diagnosis not present

## 2020-01-17 DIAGNOSIS — E1151 Type 2 diabetes mellitus with diabetic peripheral angiopathy without gangrene: Secondary | ICD-10-CM | POA: Diagnosis not present

## 2020-01-17 DIAGNOSIS — D6489 Other specified anemias: Secondary | ICD-10-CM | POA: Diagnosis not present

## 2020-01-17 DIAGNOSIS — Z794 Long term (current) use of insulin: Secondary | ICD-10-CM | POA: Diagnosis not present

## 2020-01-17 DIAGNOSIS — Z79899 Other long term (current) drug therapy: Secondary | ICD-10-CM | POA: Diagnosis not present

## 2020-01-17 DIAGNOSIS — R404 Transient alteration of awareness: Secondary | ICD-10-CM | POA: Diagnosis not present

## 2020-01-17 DIAGNOSIS — Z87891 Personal history of nicotine dependence: Secondary | ICD-10-CM | POA: Diagnosis not present

## 2020-01-17 DIAGNOSIS — E161 Other hypoglycemia: Secondary | ICD-10-CM | POA: Diagnosis not present

## 2020-01-17 DIAGNOSIS — J449 Chronic obstructive pulmonary disease, unspecified: Secondary | ICD-10-CM | POA: Diagnosis not present

## 2020-01-17 DIAGNOSIS — K219 Gastro-esophageal reflux disease without esophagitis: Secondary | ICD-10-CM | POA: Diagnosis not present

## 2020-01-17 DIAGNOSIS — E11649 Type 2 diabetes mellitus with hypoglycemia without coma: Secondary | ICD-10-CM | POA: Diagnosis not present

## 2020-01-17 DIAGNOSIS — M25551 Pain in right hip: Secondary | ICD-10-CM | POA: Diagnosis not present

## 2020-02-10 DIAGNOSIS — E663 Overweight: Secondary | ICD-10-CM | POA: Diagnosis not present

## 2020-02-10 DIAGNOSIS — Z79891 Long term (current) use of opiate analgesic: Secondary | ICD-10-CM | POA: Diagnosis not present

## 2020-02-10 DIAGNOSIS — M47816 Spondylosis without myelopathy or radiculopathy, lumbar region: Secondary | ICD-10-CM | POA: Diagnosis not present

## 2020-02-10 DIAGNOSIS — G894 Chronic pain syndrome: Secondary | ICD-10-CM | POA: Diagnosis not present

## 2020-02-10 DIAGNOSIS — M1611 Unilateral primary osteoarthritis, right hip: Secondary | ICD-10-CM | POA: Diagnosis not present

## 2020-02-10 DIAGNOSIS — M5136 Other intervertebral disc degeneration, lumbar region: Secondary | ICD-10-CM | POA: Diagnosis not present

## 2020-02-10 DIAGNOSIS — M25551 Pain in right hip: Secondary | ICD-10-CM | POA: Diagnosis not present

## 2020-02-17 ENCOUNTER — Ambulatory Visit (INDEPENDENT_AMBULATORY_CARE_PROVIDER_SITE_OTHER): Payer: Medicare Other | Admitting: Podiatry

## 2020-02-17 ENCOUNTER — Encounter: Payer: Self-pay | Admitting: Podiatry

## 2020-02-17 ENCOUNTER — Other Ambulatory Visit: Payer: Self-pay

## 2020-02-17 DIAGNOSIS — M79674 Pain in right toe(s): Secondary | ICD-10-CM

## 2020-02-17 DIAGNOSIS — B351 Tinea unguium: Secondary | ICD-10-CM | POA: Diagnosis not present

## 2020-02-17 DIAGNOSIS — M79675 Pain in left toe(s): Secondary | ICD-10-CM | POA: Diagnosis not present

## 2020-02-17 DIAGNOSIS — E1151 Type 2 diabetes mellitus with diabetic peripheral angiopathy without gangrene: Secondary | ICD-10-CM

## 2020-02-17 DIAGNOSIS — M2042 Other hammer toe(s) (acquired), left foot: Secondary | ICD-10-CM

## 2020-02-17 DIAGNOSIS — L84 Corns and callosities: Secondary | ICD-10-CM | POA: Diagnosis not present

## 2020-02-17 DIAGNOSIS — M2041 Other hammer toe(s) (acquired), right foot: Secondary | ICD-10-CM

## 2020-02-17 NOTE — Progress Notes (Signed)
Subjective:  Patient ID: Shelia Sullivan, female    DOB: 22-Jul-1931,  MRN: [id number]  86 y.o. female presents with at risk foot care. Pt has h/o NIDDM with PAD and painful corn(s) b/l 3rd digits and left 4th digit  which interfere(s) with ambulation. Aggravating factors include wearing enclosed shoe gear. Pain is relieved with periodic professional debridement.   She states she lost her toe crest for her right foot corn. Relates corn is very sore today.  Blood glucose 2 days ago was 97 mg/dl. She did not check this morning's BG.  PCP is Orlinda Blalock, NP; last visit was one month ago.  Review of Systems: Negative except as noted in the HPI.  Past Medical History:  Diagnosis Date  . Acute chest pain 06/08/2017   Last Assessment & Plan:  Consistent with angina pectoris brought on by atrial fibrillation with rapid ventricular response.  Nuclear scan intermediate risk for significant obstructive coronary artery disease.  Options discussed:  We will proceed with diagnostic cardiac catheterization but forego intervention on an ad Hoc basis as she does have history of a GI bleed has been told not to take aspiri  . Anemia   . Anxiety 06/08/2017  . Anxiety and depression   . Arthritis   . Asthma   . Asthma   . Atrophic vaginitis   . CAD (coronary artery disease)   . Cancer ([place])   . Chronic pain 06/08/2017  . Chronic respiratory failure ([place]) 06/08/2017  . CKD (chronic kidney disease)   . COPD (chronic obstructive pulmonary disease) ([place])   . Dyslipidemia 06/09/2017   Last Assessment & Plan:  Low HDL with LDL 76. Low threshold for continued statin therapy  . Esophageal reflux   . Fibrocystic breast changes of both breasts 06/27/2015  . History of GI bleed 06/08/2017   Last Assessment & Plan:  Stable at present with reasonable hemoglobin and hematocrit.  However, as noted above will defer consideration dual antiplatelet therapy at the present time due to need for  direct oral anticoagulant therapy  given history of the atrial fibrillation pending review of the results from the diagnostic catheterization  . History of MI (myocardial infarction) 06/08/2017  . Hypercholesteremia   . Hypertension   . Hypothyroid   . Irritable bladder   . Kidney stone   . Morbid obesity ([place]) 06/27/2015  . Myocardial infarction ([place])   . Neuropathy   . Nocturia   . Osteoarthritis   . Peripheral vascular disease ([place]) 06/08/2017  . Rapid atrial fibrillation ([place]) 06/08/2017   Last Assessment & Plan:  Back in sinus rhythm at present but chads Vasc score is at least 4 based on age, gender, history of hypertension and diabetes.  Recommend anticoagulant therapy with a direct oral anticoagulant.  . Scars   . Stable angina pectoris ([place]) 06/08/2017   Added automatically from request for surgery [phone]  . Swelling   . Type 2 diabetes mellitus with stage 3 chronic kidney disease ([place])   . Type 2 diabetes mellitus, without long-term current use of insulin ([place]) 06/08/2017   Last Assessment & Plan:  Target A1c less than 7.0  . Vitamin D deficiency    Past Surgical History:  Procedure Laterality Date  . APPENDECTOMY  1950  . ARTHROPLASTY Right 2002   Dr Reeves Forth  . Arthroscopy of shoulder    . BREAST SURGERY    . CARPAL TUNNEL RELEASE Left 2007  . CATARACT EXTRACTION Bilateral   . CHOLECYSTECTOMY  1980  .  HERNIA REPAIR  1985  . LITHOTRIPSY  02/21/2017  . PARTIAL HYSTERECTOMY  1950  . REPLACEMENT TOTAL KNEE Left 02/03/2012  . TONSILLECTOMY  1940   Patient Active Problem List   Diagnosis Date Noted  . Frequent falls 07/27/2019  . Hematuria 01/06/2018  . Paroxysmal atrial fibrillation ([place]) 09/30/2017  . Dyslipidemia 06/09/2017  . Type 2 diabetes mellitus, without long-term current use of insulin ([place]) 06/08/2017  . Stable angina pectoris ([place]) 06/08/2017  . Rapid atrial fibrillation ([place]) 06/08/2017  . Peripheral vascular disease ([place]) 06/08/2017  . History of GI bleed 06/08/2017  . History of MI  (myocardial infarction) 06/08/2017  . Kidney stone 06/08/2017  . Chronic respiratory failure ([place]) 06/08/2017  . Chronic pain 06/08/2017  . Asthma 06/08/2017  . Anxiety 06/08/2017  . Acute chest pain 06/08/2017  . Morbid obesity ([place]) 06/27/2015  . Fibrocystic breast changes of both breasts 06/27/2015    Current Outpatient Medications:  .  ADVAIR DISKUS 250-50 MCG/DOSE AEPB, Inhale 1 puff into the lungs 2 (two) times daily at 10 AM and 5 PM. , Disp: , Rfl:  .  albuterol (PROVENTIL HFA;VENTOLIN HFA) 108 (90 Base) MCG/ACT inhaler, Inhale 1 puff into the lungs every 6 (six) hours as needed for wheezing or shortness of breath., Disp: , Rfl:  .  B-D INSULIN SYRINGE 29G X 1/2" 0.5 ML MISC, USE 1 SYRINGE SUBCUTANEOUSLY ONCE EVERY MONTH, Disp: , Rfl:  .  Calcium Carb-Cholecalciferol (CALCIUM 600+D3 PO), Take by mouth daily., Disp: , Rfl:  .  carvedilol (COREG) 6.25 MG tablet, Take 6.25 mg by mouth 2 (two) times daily. , Disp: , Rfl:  .  cephALEXin (KEFLEX) 500 MG capsule, Take 500 mg by mouth 3 (three) times daily., Disp: , Rfl:  .  cyanocobalamin (,VITAMIN B-12,) 1000 MCG/ML injection, Inject 1,000 mcg into the muscle every 30 (thirty) days., Disp: , Rfl:  .  DULoxetine (CYMBALTA) 30 MG capsule, Take 30 mg by mouth daily., Disp: , Rfl:  .  ELIQUIS 5 MG TABS tablet, TAKE 1 TABLET TWICE A DAY, Disp: 90 tablet, Rfl: 1 .  famotidine (PEPCID) 20 MG tablet, Take 20 mg by mouth 2 (two) times daily., Disp: , Rfl:  .  fluticasone (FLONASE) 50 MCG/ACT nasal spray, Place into the nose as needed., Disp: , Rfl:  .  gabapentin (NEURONTIN) 600 MG tablet, Take 600 mg by mouth every 8 (eight) hours as needed., Disp: , Rfl:  .  HYDROmorphone (DILAUDID) 4 MG tablet, TAKE 1 TABLET BY MOUTH EVERY 6 TO 8 HOURS AS NEEDED FOR CHRONIC PAIN (MAX 4 PER DAY), Disp: , Rfl: 0 .  isosorbide mononitrate (IMDUR) 30 MG 24 hr tablet, Take 1 tablet (30 mg total) by mouth daily., Disp: 90 tablet, Rfl: 2 .  LEVEMIR FLEXTOUCH 100  UNIT/ML Pen, Inject 50 Units into the skin daily. , Disp: , Rfl:  .  levothyroxine (SYNTHROID) 50 MCG tablet, Take 50 mcg by mouth daily., Disp: , Rfl:  .  lisinopril (ZESTRIL) 5 MG tablet, Take 5 mg by mouth daily., Disp: , Rfl:  .  metFORMIN (GLUCOPHAGE) 500 MG tablet, 500 mg daily., Disp: , Rfl:  .  MOVANTIK 25 MG TABS tablet, , Disp: , Rfl:  .  nitrofurantoin (MACRODANTIN) 50 MG capsule, Take 50 mg by mouth daily., Disp: , Rfl:  .  nitrofurantoin, macrocrystal-monohydrate, (MACROBID) 100 MG capsule, Take by mouth., Disp: , Rfl:  .  nitroGLYCERIN (NITROSTAT) 0.4 MG SL tablet, Place 0.4 mg under the tongue every 5 (  five) minutes as needed for chest pain., Disp: , Rfl:  .  omeprazole (PRILOSEC) 40 MG capsule, Take 40 mg by mouth daily., Disp: , Rfl:  .  pravastatin (PRAVACHOL) 80 MG tablet, Take 80 mg by mouth at bedtime. , Disp: , Rfl:  .  QUEtiapine (SEROQUEL) 200 MG tablet, Take 200 mg by mouth at bedtime., Disp: , Rfl:  .  ranolazine (RANEXA) 500 MG 12 hr tablet, Take 1 tablet (500 mg total) by mouth 2 (two) times daily., Disp: 180 tablet, Rfl: 3 .  solifenacin (VESICARE) 10 MG tablet, Take 10 mg by mouth at bedtime., Disp: , Rfl:  .  SURE COMFORT PEN NEEDLES 32G X 6 MM MISC, , Disp: , Rfl:  .  TRULICITY [id number] [id number] SOPN, Inject 1.5 mLs into the skin once a week., Disp: , Rfl:  .  Vitamin D, Ergocalciferol, (DRISDOL) 1.25 MG (50000 UNIT) CAPS capsule, Take 50,000 Units by mouth once a week., Disp: , Rfl:  Allergies  Allergen Reactions  . Codeine   . Nsaids Other (See Comments)    GI Bleeding  . Tolmetin Other (See Comments)    GI Bleeding   Social History   Tobacco Use  Smoking Status Former Smoker  . Packs/day: 1.00  . Years: 50.00  . Pack years: 50.00  . Quit date: 03/1998  . Years since quitting: 21.9  Smokeless Tobacco Never Used   Objective:  There were no vitals filed for this visit. Constitutional Patient is a pleasant 86 y.o. Caucasian female WD, WN in NAD.  AAO x  3.  Vascular Capillary refill time to digits <4 seconds b/l lower extremities. Faintly palpable DP pulse(s) b/l lower extremities. Nonpalpable PT pulse(s) b/l lower extremities. Pedal hair absent. Lower extremity skin temperature gradient warm to cool. No pain with calf compression b/l. No cyanosis or clubbing noted.  Neurologic Normal speech. Oriented to person, place, and time. Protective sensation diminished with 10g monofilament b/l. Vibratory sensation diminished b/l. Clonus negative b/l.  Dermatologic Pedal skin with normal turgor, texture and tone bilaterally. No open wounds bilaterally. No interdigital macerations bilaterally. Toenails 1-5 b/l elongated, discolored, dystrophic, thickened, crumbly with subungual debris and tenderness to dorsal palpation. Hyperkeratotic lesion(s) distal tip  R 3rd toe with tenderness to palpation. No erythema, no edema, no drainage, no fluctuance. Hyperkeratotic lesions distal lateral aspect L 3rd toe and left 4th toe. No erythema, no edema, no drainage, no fluctuance.  Orthopedic: Normal muscle strength 5/5 to all lower extremity muscle groups bilaterally. No pain crepitus or joint limitation noted with ROM b/l. Hammertoes noted to the b/l lower extremities.   Assessment:   1. Pain due to onychomycosis of toenails of both feet   2. Corns   3. Acquired hammertoes of both feet   4. Type II diabetes mellitus with peripheral circulatory disorder [facility])    Plan:  Patient was evaluated and treated and all questions answered.  Onychomycosis with pain -Nails palliatively debridement as below. -Educated on self-care  Procedure: Nail Debridement Rationale: Pain Type of Debridement: manual, sharp debridement. Instrumentation: Nail nipper, rotary burr. Number of Nails: 10 -Examined patient. -Continue diabetic foot care principles. -Toenails 1-5 b/l were debrided in length and girth with sterile nail nippers and dremel without iatrogenic bleeding.  -Corn(s) R  3rd toe, left 3rd toe and left 4th toe pared utilizing sterile scalpel blade without complication or incident. Total number debrided=3. -Patient to report any pedal injuries to medical professional immediately. -Dispensed new gel toe crest for right foot.  Apply every morning. Remove every evening. -Patient to continue soft, supportive shoe gear daily. -Patient/POA to call should there be question/concern in the interim.  Return in about 10 weeks (around 04/27/2020) for diabetic foot care.  Marzetta Board, DPM

## 2020-02-27 DIAGNOSIS — J45909 Unspecified asthma, uncomplicated: Secondary | ICD-10-CM | POA: Diagnosis not present

## 2020-02-27 DIAGNOSIS — Z794 Long term (current) use of insulin: Secondary | ICD-10-CM | POA: Diagnosis not present

## 2020-02-27 DIAGNOSIS — Z043 Encounter for examination and observation following other accident: Secondary | ICD-10-CM | POA: Diagnosis not present

## 2020-02-27 DIAGNOSIS — E119 Type 2 diabetes mellitus without complications: Secondary | ICD-10-CM | POA: Diagnosis not present

## 2020-02-27 DIAGNOSIS — J80 Acute respiratory distress syndrome: Secondary | ICD-10-CM | POA: Diagnosis not present

## 2020-02-27 DIAGNOSIS — I4891 Unspecified atrial fibrillation: Secondary | ICD-10-CM | POA: Diagnosis not present

## 2020-02-27 DIAGNOSIS — E1165 Type 2 diabetes mellitus with hyperglycemia: Secondary | ICD-10-CM | POA: Diagnosis not present

## 2020-02-27 DIAGNOSIS — R0902 Hypoxemia: Secondary | ICD-10-CM | POA: Diagnosis not present

## 2020-02-27 DIAGNOSIS — S0990XA Unspecified injury of head, initial encounter: Secondary | ICD-10-CM | POA: Diagnosis not present

## 2020-02-27 DIAGNOSIS — M25552 Pain in left hip: Secondary | ICD-10-CM | POA: Diagnosis not present

## 2020-02-27 DIAGNOSIS — Z79899 Other long term (current) drug therapy: Secondary | ICD-10-CM | POA: Diagnosis not present

## 2020-02-27 DIAGNOSIS — S3993XA Unspecified injury of pelvis, initial encounter: Secondary | ICD-10-CM | POA: Diagnosis not present

## 2020-02-27 DIAGNOSIS — M1611 Unilateral primary osteoarthritis, right hip: Secondary | ICD-10-CM | POA: Diagnosis not present

## 2020-02-27 DIAGNOSIS — Z7901 Long term (current) use of anticoagulants: Secondary | ICD-10-CM | POA: Diagnosis not present

## 2020-02-27 DIAGNOSIS — S59901A Unspecified injury of right elbow, initial encounter: Secondary | ICD-10-CM | POA: Diagnosis not present

## 2020-02-27 DIAGNOSIS — K5900 Constipation, unspecified: Secondary | ICD-10-CM | POA: Diagnosis not present

## 2020-02-27 DIAGNOSIS — S6981XA Other specified injuries of right wrist, hand and finger(s), initial encounter: Secondary | ICD-10-CM | POA: Diagnosis not present

## 2020-02-27 DIAGNOSIS — J449 Chronic obstructive pulmonary disease, unspecified: Secondary | ICD-10-CM | POA: Diagnosis not present

## 2020-02-27 DIAGNOSIS — M19021 Primary osteoarthritis, right elbow: Secondary | ICD-10-CM | POA: Diagnosis not present

## 2020-02-27 DIAGNOSIS — R52 Pain, unspecified: Secondary | ICD-10-CM | POA: Diagnosis not present

## 2020-02-27 DIAGNOSIS — Z79891 Long term (current) use of opiate analgesic: Secondary | ICD-10-CM | POA: Diagnosis not present

## 2020-02-27 DIAGNOSIS — G8929 Other chronic pain: Secondary | ICD-10-CM | POA: Diagnosis not present

## 2020-02-27 DIAGNOSIS — S52501A Unspecified fracture of the lower end of right radius, initial encounter for closed fracture: Secondary | ICD-10-CM | POA: Diagnosis not present

## 2020-02-27 DIAGNOSIS — M19031 Primary osteoarthritis, right wrist: Secondary | ICD-10-CM | POA: Diagnosis not present

## 2020-02-27 DIAGNOSIS — S52591A Other fractures of lower end of right radius, initial encounter for closed fracture: Secondary | ICD-10-CM | POA: Diagnosis not present

## 2020-02-27 DIAGNOSIS — M79603 Pain in arm, unspecified: Secondary | ICD-10-CM | POA: Diagnosis not present

## 2020-02-27 DIAGNOSIS — G319 Degenerative disease of nervous system, unspecified: Secondary | ICD-10-CM | POA: Diagnosis not present

## 2020-02-27 DIAGNOSIS — I10 Essential (primary) hypertension: Secondary | ICD-10-CM | POA: Diagnosis not present

## 2020-03-03 DIAGNOSIS — M19131 Post-traumatic osteoarthritis, right wrist: Secondary | ICD-10-CM | POA: Diagnosis not present

## 2020-03-03 DIAGNOSIS — S52551A Other extraarticular fracture of lower end of right radius, initial encounter for closed fracture: Secondary | ICD-10-CM | POA: Diagnosis not present

## 2020-03-06 DIAGNOSIS — E559 Vitamin D deficiency, unspecified: Secondary | ICD-10-CM | POA: Diagnosis not present

## 2020-03-06 DIAGNOSIS — R5383 Other fatigue: Secondary | ICD-10-CM | POA: Diagnosis not present

## 2020-03-06 DIAGNOSIS — G4733 Obstructive sleep apnea (adult) (pediatric): Secondary | ICD-10-CM | POA: Diagnosis not present

## 2020-03-06 DIAGNOSIS — J4520 Mild intermittent asthma, uncomplicated: Secondary | ICD-10-CM | POA: Diagnosis not present

## 2020-03-06 DIAGNOSIS — J309 Allergic rhinitis, unspecified: Secondary | ICD-10-CM | POA: Diagnosis not present

## 2020-03-09 DIAGNOSIS — M1611 Unilateral primary osteoarthritis, right hip: Secondary | ICD-10-CM | POA: Diagnosis not present

## 2020-03-09 DIAGNOSIS — M5136 Other intervertebral disc degeneration, lumbar region: Secondary | ICD-10-CM | POA: Diagnosis not present

## 2020-03-09 DIAGNOSIS — G894 Chronic pain syndrome: Secondary | ICD-10-CM | POA: Diagnosis not present

## 2020-03-09 DIAGNOSIS — S52551D Other extraarticular fracture of lower end of right radius, subsequent encounter for closed fracture with routine healing: Secondary | ICD-10-CM | POA: Diagnosis not present

## 2020-03-09 DIAGNOSIS — M47816 Spondylosis without myelopathy or radiculopathy, lumbar region: Secondary | ICD-10-CM | POA: Diagnosis not present

## 2020-03-09 DIAGNOSIS — M25551 Pain in right hip: Secondary | ICD-10-CM | POA: Diagnosis not present

## 2020-03-09 DIAGNOSIS — E663 Overweight: Secondary | ICD-10-CM | POA: Diagnosis not present

## 2020-03-09 DIAGNOSIS — Z79891 Long term (current) use of opiate analgesic: Secondary | ICD-10-CM | POA: Diagnosis not present

## 2020-03-29 DIAGNOSIS — M25551 Pain in right hip: Secondary | ICD-10-CM | POA: Diagnosis not present

## 2020-03-29 DIAGNOSIS — Z79891 Long term (current) use of opiate analgesic: Secondary | ICD-10-CM | POA: Diagnosis not present

## 2020-03-29 DIAGNOSIS — M5136 Other intervertebral disc degeneration, lumbar region: Secondary | ICD-10-CM | POA: Diagnosis not present

## 2020-03-29 DIAGNOSIS — M1611 Unilateral primary osteoarthritis, right hip: Secondary | ICD-10-CM | POA: Diagnosis not present

## 2020-03-29 DIAGNOSIS — M47816 Spondylosis without myelopathy or radiculopathy, lumbar region: Secondary | ICD-10-CM | POA: Diagnosis not present

## 2020-03-29 DIAGNOSIS — E663 Overweight: Secondary | ICD-10-CM | POA: Diagnosis not present

## 2020-03-29 DIAGNOSIS — G894 Chronic pain syndrome: Secondary | ICD-10-CM | POA: Diagnosis not present

## 2020-04-04 DIAGNOSIS — E1122 Type 2 diabetes mellitus with diabetic chronic kidney disease: Secondary | ICD-10-CM | POA: Insufficient documentation

## 2020-04-04 DIAGNOSIS — G629 Polyneuropathy, unspecified: Secondary | ICD-10-CM | POA: Insufficient documentation

## 2020-04-04 DIAGNOSIS — R609 Edema, unspecified: Secondary | ICD-10-CM | POA: Insufficient documentation

## 2020-04-04 DIAGNOSIS — I2510 Atherosclerotic heart disease of native coronary artery without angina pectoris: Secondary | ICD-10-CM | POA: Insufficient documentation

## 2020-04-04 DIAGNOSIS — N183 Chronic kidney disease, stage 3 unspecified: Secondary | ICD-10-CM | POA: Insufficient documentation

## 2020-04-04 DIAGNOSIS — F32A Depression, unspecified: Secondary | ICD-10-CM | POA: Insufficient documentation

## 2020-04-04 DIAGNOSIS — C801 Malignant (primary) neoplasm, unspecified: Secondary | ICD-10-CM | POA: Insufficient documentation

## 2020-04-04 DIAGNOSIS — I10 Essential (primary) hypertension: Secondary | ICD-10-CM | POA: Insufficient documentation

## 2020-04-04 DIAGNOSIS — N3289 Other specified disorders of bladder: Secondary | ICD-10-CM | POA: Insufficient documentation

## 2020-04-04 DIAGNOSIS — E559 Vitamin D deficiency, unspecified: Secondary | ICD-10-CM | POA: Insufficient documentation

## 2020-04-04 DIAGNOSIS — L905 Scar conditions and fibrosis of skin: Secondary | ICD-10-CM | POA: Insufficient documentation

## 2020-04-04 DIAGNOSIS — R351 Nocturia: Secondary | ICD-10-CM | POA: Insufficient documentation

## 2020-04-04 DIAGNOSIS — N952 Postmenopausal atrophic vaginitis: Secondary | ICD-10-CM | POA: Insufficient documentation

## 2020-04-04 DIAGNOSIS — E7800 Pure hypercholesterolemia, unspecified: Secondary | ICD-10-CM | POA: Insufficient documentation

## 2020-04-04 DIAGNOSIS — J449 Chronic obstructive pulmonary disease, unspecified: Secondary | ICD-10-CM | POA: Insufficient documentation

## 2020-04-04 DIAGNOSIS — I219 Acute myocardial infarction, unspecified: Secondary | ICD-10-CM | POA: Insufficient documentation

## 2020-04-04 DIAGNOSIS — F419 Anxiety disorder, unspecified: Secondary | ICD-10-CM | POA: Insufficient documentation

## 2020-04-04 DIAGNOSIS — K219 Gastro-esophageal reflux disease without esophagitis: Secondary | ICD-10-CM | POA: Insufficient documentation

## 2020-04-04 DIAGNOSIS — E039 Hypothyroidism, unspecified: Secondary | ICD-10-CM | POA: Insufficient documentation

## 2020-04-04 DIAGNOSIS — M1990 Unspecified osteoarthritis, unspecified site: Secondary | ICD-10-CM | POA: Insufficient documentation

## 2020-04-04 DIAGNOSIS — D649 Anemia, unspecified: Secondary | ICD-10-CM | POA: Insufficient documentation

## 2020-04-04 DIAGNOSIS — N189 Chronic kidney disease, unspecified: Secondary | ICD-10-CM | POA: Insufficient documentation

## 2020-04-06 DIAGNOSIS — S52551D Other extraarticular fracture of lower end of right radius, subsequent encounter for closed fracture with routine healing: Secondary | ICD-10-CM | POA: Diagnosis not present

## 2020-04-10 DIAGNOSIS — E559 Vitamin D deficiency, unspecified: Secondary | ICD-10-CM | POA: Diagnosis not present

## 2020-04-10 DIAGNOSIS — Z6827 Body mass index (BMI) 27.0-27.9, adult: Secondary | ICD-10-CM | POA: Diagnosis not present

## 2020-04-10 DIAGNOSIS — E1122 Type 2 diabetes mellitus with diabetic chronic kidney disease: Secondary | ICD-10-CM | POA: Diagnosis not present

## 2020-04-10 DIAGNOSIS — E039 Hypothyroidism, unspecified: Secondary | ICD-10-CM | POA: Diagnosis not present

## 2020-04-10 DIAGNOSIS — E7800 Pure hypercholesterolemia, unspecified: Secondary | ICD-10-CM | POA: Diagnosis not present

## 2020-04-10 DIAGNOSIS — E538 Deficiency of other specified B group vitamins: Secondary | ICD-10-CM | POA: Diagnosis not present

## 2020-04-10 DIAGNOSIS — K219 Gastro-esophageal reflux disease without esophagitis: Secondary | ICD-10-CM | POA: Diagnosis not present

## 2020-04-10 DIAGNOSIS — M810 Age-related osteoporosis without current pathological fracture: Secondary | ICD-10-CM | POA: Diagnosis not present

## 2020-04-10 DIAGNOSIS — R1900 Intra-abdominal and pelvic swelling, mass and lump, unspecified site: Secondary | ICD-10-CM | POA: Diagnosis not present

## 2020-04-10 DIAGNOSIS — N183 Chronic kidney disease, stage 3 unspecified: Secondary | ICD-10-CM | POA: Diagnosis not present

## 2020-04-12 ENCOUNTER — Ambulatory Visit: Payer: Medicare Other | Admitting: Cardiology

## 2020-04-12 DIAGNOSIS — M1611 Unilateral primary osteoarthritis, right hip: Secondary | ICD-10-CM | POA: Diagnosis not present

## 2020-04-18 DIAGNOSIS — I728 Aneurysm of other specified arteries: Secondary | ICD-10-CM | POA: Diagnosis not present

## 2020-04-18 DIAGNOSIS — R1900 Intra-abdominal and pelvic swelling, mass and lump, unspecified site: Secondary | ICD-10-CM | POA: Diagnosis not present

## 2020-04-18 DIAGNOSIS — R1902 Left upper quadrant abdominal swelling, mass and lump: Secondary | ICD-10-CM | POA: Diagnosis not present

## 2020-04-18 DIAGNOSIS — L0390 Cellulitis, unspecified: Secondary | ICD-10-CM | POA: Diagnosis not present

## 2020-04-18 DIAGNOSIS — N281 Cyst of kidney, acquired: Secondary | ICD-10-CM | POA: Diagnosis not present

## 2020-04-26 DIAGNOSIS — Z79891 Long term (current) use of opiate analgesic: Secondary | ICD-10-CM | POA: Diagnosis not present

## 2020-04-26 DIAGNOSIS — E663 Overweight: Secondary | ICD-10-CM | POA: Diagnosis not present

## 2020-04-26 DIAGNOSIS — G894 Chronic pain syndrome: Secondary | ICD-10-CM | POA: Diagnosis not present

## 2020-04-26 DIAGNOSIS — M5136 Other intervertebral disc degeneration, lumbar region: Secondary | ICD-10-CM | POA: Diagnosis not present

## 2020-04-26 DIAGNOSIS — M1611 Unilateral primary osteoarthritis, right hip: Secondary | ICD-10-CM | POA: Diagnosis not present

## 2020-04-26 DIAGNOSIS — M47816 Spondylosis without myelopathy or radiculopathy, lumbar region: Secondary | ICD-10-CM | POA: Diagnosis not present

## 2020-04-26 DIAGNOSIS — M25551 Pain in right hip: Secondary | ICD-10-CM | POA: Diagnosis not present

## 2020-04-27 ENCOUNTER — Other Ambulatory Visit: Payer: Self-pay

## 2020-04-27 ENCOUNTER — Ambulatory Visit: Payer: Medicare Other | Admitting: Podiatry

## 2020-05-09 DIAGNOSIS — E559 Vitamin D deficiency, unspecified: Secondary | ICD-10-CM | POA: Diagnosis not present

## 2020-05-09 DIAGNOSIS — J309 Allergic rhinitis, unspecified: Secondary | ICD-10-CM | POA: Diagnosis not present

## 2020-05-09 DIAGNOSIS — J4520 Mild intermittent asthma, uncomplicated: Secondary | ICD-10-CM | POA: Diagnosis not present

## 2020-05-09 DIAGNOSIS — G4733 Obstructive sleep apnea (adult) (pediatric): Secondary | ICD-10-CM | POA: Diagnosis not present

## 2020-05-11 DIAGNOSIS — S52551D Other extraarticular fracture of lower end of right radius, subsequent encounter for closed fracture with routine healing: Secondary | ICD-10-CM | POA: Diagnosis not present

## 2020-05-18 ENCOUNTER — Ambulatory Visit: Payer: Medicare Other | Admitting: Podiatry

## 2020-05-24 DIAGNOSIS — M1611 Unilateral primary osteoarthritis, right hip: Secondary | ICD-10-CM | POA: Diagnosis not present

## 2020-05-24 DIAGNOSIS — M25551 Pain in right hip: Secondary | ICD-10-CM | POA: Diagnosis not present

## 2020-05-24 DIAGNOSIS — E663 Overweight: Secondary | ICD-10-CM | POA: Diagnosis not present

## 2020-05-24 DIAGNOSIS — Z79891 Long term (current) use of opiate analgesic: Secondary | ICD-10-CM | POA: Diagnosis not present

## 2020-05-24 DIAGNOSIS — G894 Chronic pain syndrome: Secondary | ICD-10-CM | POA: Diagnosis not present

## 2020-05-24 DIAGNOSIS — M47816 Spondylosis without myelopathy or radiculopathy, lumbar region: Secondary | ICD-10-CM | POA: Diagnosis not present

## 2020-05-24 DIAGNOSIS — M5136 Other intervertebral disc degeneration, lumbar region: Secondary | ICD-10-CM | POA: Diagnosis not present

## 2020-06-16 DIAGNOSIS — G4733 Obstructive sleep apnea (adult) (pediatric): Secondary | ICD-10-CM | POA: Diagnosis not present

## 2020-06-20 DIAGNOSIS — J4520 Mild intermittent asthma, uncomplicated: Secondary | ICD-10-CM | POA: Diagnosis not present

## 2020-06-20 DIAGNOSIS — E559 Vitamin D deficiency, unspecified: Secondary | ICD-10-CM | POA: Diagnosis not present

## 2020-06-20 DIAGNOSIS — G4733 Obstructive sleep apnea (adult) (pediatric): Secondary | ICD-10-CM | POA: Diagnosis not present

## 2020-06-20 DIAGNOSIS — J309 Allergic rhinitis, unspecified: Secondary | ICD-10-CM | POA: Diagnosis not present

## 2020-06-21 DIAGNOSIS — G894 Chronic pain syndrome: Secondary | ICD-10-CM | POA: Diagnosis not present

## 2020-06-21 DIAGNOSIS — M1611 Unilateral primary osteoarthritis, right hip: Secondary | ICD-10-CM | POA: Diagnosis not present

## 2020-06-21 DIAGNOSIS — Z79891 Long term (current) use of opiate analgesic: Secondary | ICD-10-CM | POA: Diagnosis not present

## 2020-06-21 DIAGNOSIS — M47816 Spondylosis without myelopathy or radiculopathy, lumbar region: Secondary | ICD-10-CM | POA: Diagnosis not present

## 2020-06-21 DIAGNOSIS — E663 Overweight: Secondary | ICD-10-CM | POA: Diagnosis not present

## 2020-06-21 DIAGNOSIS — M5136 Other intervertebral disc degeneration, lumbar region: Secondary | ICD-10-CM | POA: Diagnosis not present

## 2020-06-21 DIAGNOSIS — M25551 Pain in right hip: Secondary | ICD-10-CM | POA: Diagnosis not present

## 2020-07-11 DIAGNOSIS — E538 Deficiency of other specified B group vitamins: Secondary | ICD-10-CM | POA: Diagnosis not present

## 2020-07-11 DIAGNOSIS — N183 Chronic kidney disease, stage 3 unspecified: Secondary | ICD-10-CM | POA: Diagnosis not present

## 2020-07-11 DIAGNOSIS — E1122 Type 2 diabetes mellitus with diabetic chronic kidney disease: Secondary | ICD-10-CM | POA: Diagnosis not present

## 2020-07-11 DIAGNOSIS — Z139 Encounter for screening, unspecified: Secondary | ICD-10-CM | POA: Diagnosis not present

## 2020-07-11 DIAGNOSIS — E559 Vitamin D deficiency, unspecified: Secondary | ICD-10-CM | POA: Diagnosis not present

## 2020-07-11 DIAGNOSIS — Z9181 History of falling: Secondary | ICD-10-CM | POA: Diagnosis not present

## 2020-07-11 DIAGNOSIS — J3489 Other specified disorders of nose and nasal sinuses: Secondary | ICD-10-CM | POA: Diagnosis not present

## 2020-07-11 DIAGNOSIS — E039 Hypothyroidism, unspecified: Secondary | ICD-10-CM | POA: Diagnosis not present

## 2020-07-11 DIAGNOSIS — E785 Hyperlipidemia, unspecified: Secondary | ICD-10-CM | POA: Diagnosis not present

## 2020-07-11 DIAGNOSIS — M810 Age-related osteoporosis without current pathological fracture: Secondary | ICD-10-CM | POA: Diagnosis not present

## 2020-07-11 DIAGNOSIS — I10 Essential (primary) hypertension: Secondary | ICD-10-CM | POA: Diagnosis not present

## 2020-07-18 DIAGNOSIS — E538 Deficiency of other specified B group vitamins: Secondary | ICD-10-CM | POA: Diagnosis not present

## 2020-07-18 DIAGNOSIS — E1122 Type 2 diabetes mellitus with diabetic chronic kidney disease: Secondary | ICD-10-CM | POA: Diagnosis not present

## 2020-07-18 DIAGNOSIS — E785 Hyperlipidemia, unspecified: Secondary | ICD-10-CM | POA: Diagnosis not present

## 2020-07-18 DIAGNOSIS — E559 Vitamin D deficiency, unspecified: Secondary | ICD-10-CM | POA: Diagnosis not present

## 2020-07-18 DIAGNOSIS — E039 Hypothyroidism, unspecified: Secondary | ICD-10-CM | POA: Diagnosis not present

## 2020-07-19 DIAGNOSIS — M1611 Unilateral primary osteoarthritis, right hip: Secondary | ICD-10-CM | POA: Diagnosis not present

## 2020-07-26 DIAGNOSIS — E213 Hyperparathyroidism, unspecified: Secondary | ICD-10-CM | POA: Diagnosis not present

## 2020-07-26 DIAGNOSIS — E210 Primary hyperparathyroidism: Secondary | ICD-10-CM | POA: Diagnosis not present

## 2020-08-01 DIAGNOSIS — M47816 Spondylosis without myelopathy or radiculopathy, lumbar region: Secondary | ICD-10-CM | POA: Diagnosis not present

## 2020-08-01 DIAGNOSIS — M1611 Unilateral primary osteoarthritis, right hip: Secondary | ICD-10-CM | POA: Diagnosis not present

## 2020-08-01 DIAGNOSIS — E663 Overweight: Secondary | ICD-10-CM | POA: Diagnosis not present

## 2020-08-01 DIAGNOSIS — G894 Chronic pain syndrome: Secondary | ICD-10-CM | POA: Diagnosis not present

## 2020-08-01 DIAGNOSIS — Z79891 Long term (current) use of opiate analgesic: Secondary | ICD-10-CM | POA: Diagnosis not present

## 2020-08-01 DIAGNOSIS — M5136 Other intervertebral disc degeneration, lumbar region: Secondary | ICD-10-CM | POA: Diagnosis not present

## 2020-08-01 DIAGNOSIS — M25551 Pain in right hip: Secondary | ICD-10-CM | POA: Diagnosis not present

## 2020-08-02 DIAGNOSIS — N390 Urinary tract infection, site not specified: Secondary | ICD-10-CM | POA: Diagnosis not present

## 2020-08-02 DIAGNOSIS — B373 Candidiasis of vulva and vagina: Secondary | ICD-10-CM | POA: Diagnosis not present

## 2020-08-02 DIAGNOSIS — N200 Calculus of kidney: Secondary | ICD-10-CM | POA: Diagnosis not present

## 2020-08-02 DIAGNOSIS — N3289 Other specified disorders of bladder: Secondary | ICD-10-CM | POA: Diagnosis not present

## 2020-08-08 DIAGNOSIS — Z1331 Encounter for screening for depression: Secondary | ICD-10-CM | POA: Diagnosis not present

## 2020-08-08 DIAGNOSIS — Z9181 History of falling: Secondary | ICD-10-CM | POA: Diagnosis not present

## 2020-08-08 DIAGNOSIS — Z0000 Encounter for general adult medical examination without abnormal findings: Secondary | ICD-10-CM | POA: Diagnosis not present

## 2020-08-08 DIAGNOSIS — E785 Hyperlipidemia, unspecified: Secondary | ICD-10-CM | POA: Diagnosis not present

## 2020-08-09 DIAGNOSIS — L989 Disorder of the skin and subcutaneous tissue, unspecified: Secondary | ICD-10-CM | POA: Diagnosis not present

## 2020-08-09 DIAGNOSIS — Z6829 Body mass index (BMI) 29.0-29.9, adult: Secondary | ICD-10-CM | POA: Diagnosis not present

## 2020-08-30 DIAGNOSIS — M5136 Other intervertebral disc degeneration, lumbar region: Secondary | ICD-10-CM | POA: Diagnosis not present

## 2020-08-30 DIAGNOSIS — G894 Chronic pain syndrome: Secondary | ICD-10-CM | POA: Diagnosis not present

## 2020-08-30 DIAGNOSIS — M47816 Spondylosis without myelopathy or radiculopathy, lumbar region: Secondary | ICD-10-CM | POA: Diagnosis not present

## 2020-08-30 DIAGNOSIS — M25551 Pain in right hip: Secondary | ICD-10-CM | POA: Diagnosis not present

## 2020-08-30 DIAGNOSIS — Z79891 Long term (current) use of opiate analgesic: Secondary | ICD-10-CM | POA: Diagnosis not present

## 2020-08-30 DIAGNOSIS — E663 Overweight: Secondary | ICD-10-CM | POA: Diagnosis not present

## 2020-08-30 DIAGNOSIS — M1611 Unilateral primary osteoarthritis, right hip: Secondary | ICD-10-CM | POA: Diagnosis not present

## 2020-08-30 DIAGNOSIS — Z1389 Encounter for screening for other disorder: Secondary | ICD-10-CM | POA: Diagnosis not present

## 2020-09-04 DIAGNOSIS — G4733 Obstructive sleep apnea (adult) (pediatric): Secondary | ICD-10-CM | POA: Diagnosis not present

## 2020-09-04 DIAGNOSIS — J309 Allergic rhinitis, unspecified: Secondary | ICD-10-CM | POA: Diagnosis not present

## 2020-09-04 DIAGNOSIS — E559 Vitamin D deficiency, unspecified: Secondary | ICD-10-CM | POA: Diagnosis not present

## 2020-09-04 DIAGNOSIS — J4520 Mild intermittent asthma, uncomplicated: Secondary | ICD-10-CM | POA: Diagnosis not present

## 2020-09-14 ENCOUNTER — Other Ambulatory Visit: Payer: Self-pay

## 2020-09-14 ENCOUNTER — Ambulatory Visit (INDEPENDENT_AMBULATORY_CARE_PROVIDER_SITE_OTHER): Payer: Medicare Other | Admitting: Podiatry

## 2020-09-14 ENCOUNTER — Encounter: Payer: Self-pay | Admitting: Podiatry

## 2020-09-14 DIAGNOSIS — L84 Corns and callosities: Secondary | ICD-10-CM

## 2020-09-14 DIAGNOSIS — E11621 Type 2 diabetes mellitus with foot ulcer: Secondary | ICD-10-CM | POA: Diagnosis not present

## 2020-09-14 DIAGNOSIS — J4520 Mild intermittent asthma, uncomplicated: Secondary | ICD-10-CM | POA: Diagnosis not present

## 2020-09-14 DIAGNOSIS — M79675 Pain in left toe(s): Secondary | ICD-10-CM

## 2020-09-14 DIAGNOSIS — E1151 Type 2 diabetes mellitus with diabetic peripheral angiopathy without gangrene: Secondary | ICD-10-CM

## 2020-09-14 DIAGNOSIS — M79674 Pain in right toe(s): Secondary | ICD-10-CM

## 2020-09-14 DIAGNOSIS — B351 Tinea unguium: Secondary | ICD-10-CM

## 2020-09-14 DIAGNOSIS — L97511 Non-pressure chronic ulcer of other part of right foot limited to breakdown of skin: Secondary | ICD-10-CM

## 2020-09-14 NOTE — Patient Instructions (Addendum)
DRESSING CHANGES R 3rd toe:   PHARMACY SHOPPING LIST: Saline or Wound Cleanser for cleaning wound 2 x 2 inch sterile gauze for cleaning wound   A. IF DISPENSED, WEAR SURGICAL SHOE OR WALKING BOOT AT ALL TIMES.  B. IF PRESCRIBED ORAL ANTIBIOTICS, TAKE ALL MEDICATION AS PRESCRIBED UNTIL ALL ARE GONE.  C. IF DOCTOR HAS DESIGNATED NONWEIGHTBEARING STATUS, PLEASE ADHERE TO INSTRUCTIONS.   1. KEEP right foot DRY AT ALL TIMES!!!!  2. CLEANSE ULCER WITH SALINE OR WOUND CLEANSER.  3. DAB DRY WITH GAUZE SPONGE.  4. APPLY A LIGHT AMOUNT OF triple antibiotic ointment TO BASE OF ULCER.  5. APPLY Band-aid to right 3rd toe.  6. WEAR TOE CREST DAILY AT ALL TIMES DURING WALKING HOURS. CALL OFFICE IF TOE SEEMS TO BE GETTING WORSE.  7. DO NOT WALK BAREFOOT!!!  8.  IF YOU EXPERIENCE ANY FEVER, CHILLS, NIGHTSWEATS, NAUSEA OR VOMITING, ELEVATED OR LOW BLOOD SUGARS, REPORT TO EMERGENCY ROOM.  9. IF YOU EXPERIENCE INCREASED REDNESS, PAIN, SWELLING, DISCOLORATION, ODOR, PUS, DRAINAGE OR WARMTH OF YOUR FOOT, REPORT TO EMERGENCY ROOM.

## 2020-09-15 DIAGNOSIS — Z1389 Encounter for screening for other disorder: Secondary | ICD-10-CM | POA: Diagnosis not present

## 2020-09-15 DIAGNOSIS — M5136 Other intervertebral disc degeneration, lumbar region: Secondary | ICD-10-CM | POA: Diagnosis not present

## 2020-09-15 DIAGNOSIS — M47816 Spondylosis without myelopathy or radiculopathy, lumbar region: Secondary | ICD-10-CM | POA: Diagnosis not present

## 2020-09-15 DIAGNOSIS — M25551 Pain in right hip: Secondary | ICD-10-CM | POA: Diagnosis not present

## 2020-09-15 DIAGNOSIS — G894 Chronic pain syndrome: Secondary | ICD-10-CM | POA: Diagnosis not present

## 2020-09-15 DIAGNOSIS — Z79891 Long term (current) use of opiate analgesic: Secondary | ICD-10-CM | POA: Diagnosis not present

## 2020-09-15 DIAGNOSIS — M1611 Unilateral primary osteoarthritis, right hip: Secondary | ICD-10-CM | POA: Diagnosis not present

## 2020-09-15 DIAGNOSIS — E663 Overweight: Secondary | ICD-10-CM | POA: Diagnosis not present

## 2020-09-17 NOTE — Progress Notes (Signed)
Subjective: Shelia Sullivan is a pleasant 87 y.o. female patient seen today for at risk foot care with h/o NIDDM and PAD. She is seen for distal corn right 3rd digit and painful thick toenails that are difficult to trim. Pain interferes with ambulation. Aggravating factors include wearing enclosed shoe gear. Pain is relieved with periodic professional debridement.  She did not check her blood glucose today.Her last A1c was 7%.  PCP is Potts, Georgeann Oppenheim, NP. Last visit was: 12/28/2019.  Allergies  Allergen Reactions   Aspirin Other (See Comments)   Codeine    Nsaids Other (See Comments)    GI Bleeding   Tolmetin Other (See Comments)    GI Bleeding    Objective: Physical Exam  General: Shelia Sullivan is a pleasant 87 y.o. Caucasian female, WD, WN in NAD. AAO x 3.   Vascular:  Capillary refill time to digits <4 seconds b/l lower extremities. Faintly palpable DP pulse(s) b/l lower extremities. Nonpalpable PT pulse(s) b/l lower extremities. Pedal hair absent. Lower extremity skin temperature gradient within normal limits. No pain with calf compression b/l. No ischemia or gangrene noted b/l lower extremities.  Dermatological:  Pedal skin with normal turgor, texture and tone b/l lower extremities. No interdigital macerations b/l lower extremities. Toenails 1-5 b/l elongated, discolored, dystrophic, thickened, crumbly with subungual debris and tenderness to dorsal palpation. Hyperkeratotic lesion(s) L 3rd toe and L 4th toe.  No erythema, no edema, no drainage, no fluctuance.  Wound Location: R 3rd toe distal tip There is a moderate amount of devitalized tissue present in the wound. Predebridement Wound Measurement:  pinpoint distal tip with surrounding hyperkeratosis Postdebridement Wound Measurement: 0.1 x 0.1 x 0.1cm. Wound Base: Granular/Healthy Peri-wound: Calloused Exudate: None: wound tissue dry Blood Loss during debridement: 0 cc('s). Material in wound which inhibits  healing/promotes adjacent tissue breakdown:  nonviable hyperkeratosis. Description of tissue removed from ulceration today:  nonviable hyperkeratosis. Sign(s) of clinical bacterial infection: no clinical signs of infection noted on examination today.   Musculoskeletal:  Normal muscle strength 5/5 to all lower extremity muscle groups bilaterally. No pain crepitus or joint limitation noted with ROM b/l. Hammertoe(s) noted to the 2-5 bilaterally.  Neurological:  Protective sensation diminished with 10g monofilament b/l. Vibratory sensation diminished b/l.  Assessment and Plan:  1. Pain due to onychomycosis of toenails of both feet   2. Diabetic ulcer of toe of right foot associated with type 2 diabetes mellitus, limited to breakdown of skin ([place])   3. Corns   4. Type II diabetes mellitus with peripheral circulatory disorder (HCC)   -Plan: -Examined patient. -Patient was evaluated and treated and all questions answered.  -Patient/POA/Family member educated on diagnosis and treatment plan of routine ulcer debridement/wound care.  -Ulceration debridement achieved utilizing sharp excisional debridement with sterile scalpel blade.. Type/amount of devitalized tissue removed: nonviable hyperkeratosis -Today's ulcer size post-debridement: 0.1 x 0.1 x 0.1 cm. -Ulceration cleansed with wound cleanser. Iodosorb Gel applied to base of ulceration and secured with light dressing. -Wound responded well to today's debridement. -Patient risk factors affecting healing of ulcer: diabetes, PAD -Tenny Craw given written instructions on daily wound care for R 3rd toe ulceration. -Patient to continue soft, supportive shoe gear daily. -Toenails 1-5 b/l were debrided in length and girth with sterile nail nippers and dremel without iatrogenic bleeding.  -Corn(s) left 3rd and 4th digits pared utilizing sterile scalpel blade without complication or incident. Total number debrided=2. -Patient to report any pedal  injuries to medical professional immediately. -  Patient/POA to call should there be question/concern in the interim.  Return in about 1 week for evaluation of right 3rd digit with Dr. Cannon Kettle (around 09/21/2020)  Marzetta Board, DPM

## 2020-09-21 DIAGNOSIS — M47816 Spondylosis without myelopathy or radiculopathy, lumbar region: Secondary | ICD-10-CM | POA: Diagnosis not present

## 2020-09-21 DIAGNOSIS — Z79891 Long term (current) use of opiate analgesic: Secondary | ICD-10-CM | POA: Diagnosis not present

## 2020-09-21 DIAGNOSIS — M1611 Unilateral primary osteoarthritis, right hip: Secondary | ICD-10-CM | POA: Diagnosis not present

## 2020-09-21 DIAGNOSIS — E663 Overweight: Secondary | ICD-10-CM | POA: Diagnosis not present

## 2020-09-21 DIAGNOSIS — M25551 Pain in right hip: Secondary | ICD-10-CM | POA: Diagnosis not present

## 2020-09-21 DIAGNOSIS — M5136 Other intervertebral disc degeneration, lumbar region: Secondary | ICD-10-CM | POA: Diagnosis not present

## 2020-09-21 DIAGNOSIS — Z1389 Encounter for screening for other disorder: Secondary | ICD-10-CM | POA: Diagnosis not present

## 2020-09-21 DIAGNOSIS — G894 Chronic pain syndrome: Secondary | ICD-10-CM | POA: Diagnosis not present

## 2020-09-22 ENCOUNTER — Encounter: Payer: Self-pay | Admitting: Sports Medicine

## 2020-09-22 ENCOUNTER — Other Ambulatory Visit: Payer: Self-pay

## 2020-09-22 ENCOUNTER — Ambulatory Visit (INDEPENDENT_AMBULATORY_CARE_PROVIDER_SITE_OTHER): Payer: Medicare Other | Admitting: Sports Medicine

## 2020-09-22 DIAGNOSIS — E1151 Type 2 diabetes mellitus with diabetic peripheral angiopathy without gangrene: Secondary | ICD-10-CM

## 2020-09-22 DIAGNOSIS — L97511 Non-pressure chronic ulcer of other part of right foot limited to breakdown of skin: Secondary | ICD-10-CM

## 2020-09-22 DIAGNOSIS — E11621 Type 2 diabetes mellitus with foot ulcer: Secondary | ICD-10-CM

## 2020-09-22 NOTE — Progress Notes (Signed)
Subjective: Shelia Sullivan is a 87 y.o. female patient seen in office for evaluation of ulceration of the Right 3rd toe. Patient is currently using iodosorb with help of daughter with bandaid to the toe. Patient has no other pedal complaints at this time.  Patient Active Problem List   Diagnosis Date Noted   Vitamin D deficiency    Type 2 diabetes mellitus with stage 3 chronic kidney disease (HCC)    Swelling    Scars    Osteoarthritis    Nocturia    Neuropathy    Myocardial infarction ([place])    Irritable bladder    Hypothyroid    Hypertension    Hypercholesteremia    Esophageal reflux    COPD (chronic obstructive pulmonary disease) (HCC)    CKD (chronic kidney disease)    Cancer (HCC)    CAD (coronary artery disease)    Atrophic vaginitis    Arthritis    Anxiety and depression    Anemia    Frequent falls 07/27/2019   Hematuria 01/06/2018   Paroxysmal atrial fibrillation ([place]) 09/30/2017   Dyslipidemia 06/09/2017   Type 2 diabetes mellitus, without long-term current use of insulin ([place]) 06/08/2017   Stable angina pectoris ([place]) 06/08/2017   Rapid atrial fibrillation ([place]) 06/08/2017   Peripheral vascular disease ([place]) 06/08/2017   History of GI bleed 06/08/2017   History of MI (myocardial infarction) 06/08/2017   Kidney stone 06/08/2017   Chronic respiratory failure ([place]) 06/08/2017   Chronic pain 06/08/2017   Asthma 06/08/2017   Anxiety 06/08/2017   Acute chest pain 06/08/2017   Morbid obesity ([place]) 06/27/2015   Fibrocystic breast changes of both breasts 06/27/2015   Current Outpatient Medications on File Prior to Visit  Medication Sig Dispense Refill   ADVAIR DISKUS 250-50 MCG/DOSE AEPB Inhale 1 puff into the lungs 2 (two) times daily at 10 AM and 5 PM.      albuterol (PROVENTIL HFA;VENTOLIN HFA) 108 (90 Base) MCG/ACT inhaler Inhale 1 puff into the lungs every 6 (six) hours as needed for wheezing or shortness of breath.     amoxicillin-clavulanate (AUGMENTIN)  875-125 MG tablet      B-D INSULIN SYRINGE 29G X 1/2" 0.5 ML MISC USE 1 SYRINGE SUBCUTANEOUSLY ONCE EVERY MONTH     Calcium Carb-Cholecalciferol (CALCIUM 600+D3 PO) Take by mouth daily.     carvedilol (COREG) 6.25 MG tablet Take 6.25 mg by mouth 2 (two) times daily.      cephALEXin (KEFLEX) 500 MG capsule Take 500 mg by mouth 3 (three) times daily.     cyanocobalamin (,VITAMIN B-12,) 1000 MCG/ML injection Inject 1,000 mcg into the muscle every 30 (thirty) days.     DULoxetine (CYMBALTA) 30 MG capsule Take 30 mg by mouth daily.     ELIQUIS 5 MG TABS tablet TAKE 1 TABLET TWICE A DAY 90 tablet 1   famotidine (PEPCID) 20 MG tablet Take 20 mg by mouth 2 (two) times daily.     fluticasone (FLONASE) 50 MCG/ACT nasal spray Place into the nose as needed.     gabapentin (NEURONTIN) 600 MG tablet Take 600 mg by mouth every 8 (eight) hours as needed.     HYDROmorphone (DILAUDID) 4 MG tablet TAKE 1 TABLET BY MOUTH EVERY 6 TO 8 HOURS AS NEEDED FOR CHRONIC PAIN (MAX 4 PER DAY)  0   isosorbide mononitrate (IMDUR) 30 MG 24 hr tablet Take 1 tablet (30 mg total) by mouth daily. 90 tablet 2   LEVEMIR FLEXTOUCH 100 UNIT/ML  Pen Inject 50 Units into the skin daily.      levothyroxine (SYNTHROID) 50 MCG tablet Take 50 mcg by mouth daily.     lisinopril (ZESTRIL) 10 MG tablet      lisinopril (ZESTRIL) 5 MG tablet Take 5 mg by mouth daily.     lubiprostone (AMITIZA) 24 MCG capsule      metFORMIN (GLUCOPHAGE) 500 MG tablet 500 mg daily.     MOVANTIK 25 MG TABS tablet      naloxone (NARCAN) nasal spray 4 mg/0.1 mL      nitrofurantoin (MACRODANTIN) 50 MG capsule Take 50 mg by mouth daily.     nitrofurantoin, macrocrystal-monohydrate, (MACROBID) 100 MG capsule Take by mouth.     nitroGLYCERIN (NITROSTAT) 0.4 MG SL tablet Place 0.4 mg under the tongue every 5 (five) minutes as needed for chest pain.     nystatin cream (MYCOSTATIN)      omeprazole (PRILOSEC) 40 MG capsule Take 40 mg by mouth daily.     pravastatin  (PRAVACHOL) 80 MG tablet Take 80 mg by mouth at bedtime.      QUEtiapine (SEROQUEL) 200 MG tablet Take 200 mg by mouth at bedtime.     ranolazine (RANEXA) 500 MG 12 hr tablet Take 1 tablet (500 mg total) by mouth 2 (two) times daily. 180 tablet 3   sertraline (ZOLOFT) 25 MG tablet      solifenacin (VESICARE) 10 MG tablet Take 10 mg by mouth at bedtime.     SURE COMFORT PEN NEEDLES 32G X 6 MM MISC      TRULICITY A999333 0000000 SOPN Inject 1.5 mLs into the skin once a week.     Vitamin D, Ergocalciferol, (DRISDOL) 1.25 MG (50000 UNIT) CAPS capsule Take 50,000 Units by mouth once a week.     No current facility-administered medications on file prior to visit.   Allergies  Allergen Reactions   Aspirin Other (See Comments)   Codeine    Nsaids Other (See Comments)    GI Bleeding   Tolmetin Other (See Comments)    GI Bleeding    No results found for this or any previous visit (from the past 2160 hour(s)).  Objective: There were no vitals filed for this visit.  General: Patient is awake, alert, oriented x 3 and in no acute distress.  Dermatology: Skin is warm and dry bilateral with a prematurely healed wound at the right 3rd toe. There is no malodor, no active drainage, no erythema, no edema. No acute signs of infection.   Vascular: Dorsalis Pedis pulse = 1/4 Bilateral,  Posterior Tibial pulse = 1/4 Bilateral,  Capillary Fill Time < 5 seconds  Neurologic: Protective sensation absent bilateral.   Musculosketal: +Hammertoe, semi-rigid in nature. No pain to palpation to right 3rd toe.    No results for input(s): GRAMSTAIN, LABORGA in the last 8760 hours.  Assessment and Plan:  Problem List Items Addressed This Visit   None Visit Diagnoses     Diabetic ulcer of toe of right foot associated with type 2 diabetes mellitus, limited to breakdown of skin ([place])    -  Primary   Type II diabetes mellitus with peripheral circulatory disorder ([place])           -Examined patient and discussed  the progression of the wound and treatment alternatives. -Xrays reviewed - Excisionally dedbrided right 3rd toe using a 15 blade with no underlying opening dry heme to area.  -Applied protective bandaid dressing and Iodosorb and advised patient to  have the daughter to continue with doing the same daily and using toe crest pad as directed when ambulating -Patient to return to office in 1 month for reassessment advised patient if the wound has healed then she will not need a tenotomy however if the wound still remains may benefit from a tenotomy however must bring her daughter with her to further discuss. Landis Martins, DPM

## 2020-09-25 DIAGNOSIS — L814 Other melanin hyperpigmentation: Secondary | ICD-10-CM | POA: Diagnosis not present

## 2020-09-25 DIAGNOSIS — C44619 Basal cell carcinoma of skin of left upper limb, including shoulder: Secondary | ICD-10-CM | POA: Diagnosis not present

## 2020-09-25 DIAGNOSIS — L821 Other seborrheic keratosis: Secondary | ICD-10-CM | POA: Diagnosis not present

## 2020-09-27 ENCOUNTER — Telehealth: Payer: Self-pay | Admitting: Cardiology

## 2020-09-27 MED ORDER — ISOSORBIDE MONONITRATE ER 30 MG PO TB24: 30.0000 mg | ORAL_TABLET | Freq: Every day | ORAL | 3 refills | Status: DC

## 2020-09-27 NOTE — Telephone Encounter (Signed)
*  STAT* If patient is at the pharmacy, call can be transferred to refill team.   1. Which medications need to be refilled? (please list name of each medication and dose if known) isosorbide mononitrate (IMDUR) 30 MG 24 hr tablet  2. Which pharmacy/location (including street and city if local pharmacy) is medication to be sent to? [place], [place]  3. Do they need a 30 day or 90 day supply? 30 ds   Pt is needing refill until meds come in from [facility], currently out of medication

## 2020-09-27 NOTE — Telephone Encounter (Signed)
Refill sent in per request.  

## 2020-10-13 DIAGNOSIS — G894 Chronic pain syndrome: Secondary | ICD-10-CM | POA: Diagnosis not present

## 2020-10-13 DIAGNOSIS — M5136 Other intervertebral disc degeneration, lumbar region: Secondary | ICD-10-CM | POA: Diagnosis not present

## 2020-10-13 DIAGNOSIS — Z1389 Encounter for screening for other disorder: Secondary | ICD-10-CM | POA: Diagnosis not present

## 2020-10-13 DIAGNOSIS — E663 Overweight: Secondary | ICD-10-CM | POA: Diagnosis not present

## 2020-10-13 DIAGNOSIS — M1611 Unilateral primary osteoarthritis, right hip: Secondary | ICD-10-CM | POA: Diagnosis not present

## 2020-10-13 DIAGNOSIS — M25551 Pain in right hip: Secondary | ICD-10-CM | POA: Diagnosis not present

## 2020-10-13 DIAGNOSIS — M47816 Spondylosis without myelopathy or radiculopathy, lumbar region: Secondary | ICD-10-CM | POA: Diagnosis not present

## 2020-10-13 DIAGNOSIS — Z79891 Long term (current) use of opiate analgesic: Secondary | ICD-10-CM | POA: Diagnosis not present

## 2020-10-17 DIAGNOSIS — E538 Deficiency of other specified B group vitamins: Secondary | ICD-10-CM | POA: Diagnosis not present

## 2020-10-17 DIAGNOSIS — N183 Chronic kidney disease, stage 3 unspecified: Secondary | ICD-10-CM | POA: Diagnosis not present

## 2020-10-17 DIAGNOSIS — Z6828 Body mass index (BMI) 28.0-28.9, adult: Secondary | ICD-10-CM | POA: Diagnosis not present

## 2020-10-17 DIAGNOSIS — E1122 Type 2 diabetes mellitus with diabetic chronic kidney disease: Secondary | ICD-10-CM | POA: Diagnosis not present

## 2020-10-17 DIAGNOSIS — E7800 Pure hypercholesterolemia, unspecified: Secondary | ICD-10-CM | POA: Diagnosis not present

## 2020-10-17 DIAGNOSIS — E559 Vitamin D deficiency, unspecified: Secondary | ICD-10-CM | POA: Diagnosis not present

## 2020-10-17 DIAGNOSIS — F419 Anxiety disorder, unspecified: Secondary | ICD-10-CM | POA: Diagnosis not present

## 2020-10-17 DIAGNOSIS — I10 Essential (primary) hypertension: Secondary | ICD-10-CM | POA: Diagnosis not present

## 2020-10-17 DIAGNOSIS — F32A Depression, unspecified: Secondary | ICD-10-CM | POA: Diagnosis not present

## 2020-10-17 DIAGNOSIS — E039 Hypothyroidism, unspecified: Secondary | ICD-10-CM | POA: Diagnosis not present

## 2020-10-18 ENCOUNTER — Ambulatory Visit (INDEPENDENT_AMBULATORY_CARE_PROVIDER_SITE_OTHER): Payer: Medicare Other | Admitting: Sports Medicine

## 2020-10-18 ENCOUNTER — Encounter: Payer: Self-pay | Admitting: Sports Medicine

## 2020-10-18 ENCOUNTER — Other Ambulatory Visit: Payer: Self-pay

## 2020-10-18 DIAGNOSIS — L97511 Non-pressure chronic ulcer of other part of right foot limited to breakdown of skin: Secondary | ICD-10-CM | POA: Diagnosis not present

## 2020-10-18 DIAGNOSIS — M2042 Other hammer toe(s) (acquired), left foot: Secondary | ICD-10-CM

## 2020-10-18 DIAGNOSIS — E1151 Type 2 diabetes mellitus with diabetic peripheral angiopathy without gangrene: Secondary | ICD-10-CM

## 2020-10-18 DIAGNOSIS — M2041 Other hammer toe(s) (acquired), right foot: Secondary | ICD-10-CM

## 2020-10-18 DIAGNOSIS — E11621 Type 2 diabetes mellitus with foot ulcer: Secondary | ICD-10-CM

## 2020-10-18 NOTE — Progress Notes (Signed)
Subjective: Shelia Sullivan is a 87 y.o. female patient seen in office for evaluation of ulceration of the Right 3rd toe. Patient is currently using iodosorb with help of daughter with bandaid to the toe with no problems and is currently using toe crest pad.  Patient reports that her daughter thinks that it looks all right. Patient has no other pedal complaints at this time.  Patient Active Problem List   Diagnosis Date Noted   Vitamin D deficiency    Type 2 diabetes mellitus with stage 3 chronic kidney disease (HCC)    Swelling    Scars    Osteoarthritis    Nocturia    Neuropathy    Myocardial infarction ([place])    Irritable bladder    Hypothyroid    Hypertension    Hypercholesteremia    Esophageal reflux    COPD (chronic obstructive pulmonary disease) (HCC)    CKD (chronic kidney disease)    Cancer (HCC)    CAD (coronary artery disease)    Atrophic vaginitis    Arthritis    Anxiety and depression    Anemia    Frequent falls 07/27/2019   Hematuria 01/06/2018   Paroxysmal atrial fibrillation ([place]) 09/30/2017   Dyslipidemia 06/09/2017   Type 2 diabetes mellitus, without long-term current use of insulin ([place]) 06/08/2017   Stable angina pectoris ([place]) 06/08/2017   Rapid atrial fibrillation ([place]) 06/08/2017   Peripheral vascular disease ([place]) 06/08/2017   History of GI bleed 06/08/2017   History of MI (myocardial infarction) 06/08/2017   Kidney stone 06/08/2017   Chronic respiratory failure ([place]) 06/08/2017   Chronic pain 06/08/2017   Asthma 06/08/2017   Anxiety 06/08/2017   Acute chest pain 06/08/2017   Morbid obesity ([place]) 06/27/2015   Fibrocystic breast changes of both breasts 06/27/2015   Current Outpatient Medications on File Prior to Visit  Medication Sig Dispense Refill   ADVAIR DISKUS 250-50 MCG/DOSE AEPB Inhale 1 puff into the lungs 2 (two) times daily at 10 AM and 5 PM.      albuterol (PROVENTIL HFA;VENTOLIN HFA) 108 (90 Base) MCG/ACT inhaler Inhale 1 puff into  the lungs every 6 (six) hours as needed for wheezing or shortness of breath.     amoxicillin-clavulanate (AUGMENTIN) 875-125 MG tablet      B-D INSULIN SYRINGE 29G X 1/2" 0.5 ML MISC USE 1 SYRINGE SUBCUTANEOUSLY ONCE EVERY MONTH     Calcium Carb-Cholecalciferol (CALCIUM 600+D3 PO) Take by mouth daily.     carvedilol (COREG) 6.25 MG tablet Take 6.25 mg by mouth 2 (two) times daily.      cephALEXin (KEFLEX) 500 MG capsule Take 500 mg by mouth 3 (three) times daily.     cyanocobalamin (,VITAMIN B-12,) 1000 MCG/ML injection Inject 1,000 mcg into the muscle every 30 (thirty) days.     DULoxetine (CYMBALTA) 30 MG capsule Take 30 mg by mouth daily.     ELIQUIS 5 MG TABS tablet TAKE 1 TABLET TWICE A DAY 90 tablet 1   famotidine (PEPCID) 20 MG tablet Take 20 mg by mouth 2 (two) times daily.     fluticasone (FLONASE) 50 MCG/ACT nasal spray Place into the nose as needed.     gabapentin (NEURONTIN) 600 MG tablet Take 600 mg by mouth every 8 (eight) hours as needed.     HYDROmorphone (DILAUDID) 4 MG tablet TAKE 1 TABLET BY MOUTH EVERY 6 TO 8 HOURS AS NEEDED FOR CHRONIC PAIN (MAX 4 PER DAY)  0   isosorbide mononitrate (IMDUR) 30  MG 24 hr tablet Take 1 tablet (30 mg total) by mouth daily. 90 tablet 3   LEVEMIR FLEXTOUCH 100 UNIT/ML Pen Inject 50 Units into the skin daily.      levothyroxine (SYNTHROID) 50 MCG tablet Take 50 mcg by mouth daily.     lisinopril (ZESTRIL) 10 MG tablet      lisinopril (ZESTRIL) 5 MG tablet Take 5 mg by mouth daily.     lubiprostone (AMITIZA) 24 MCG capsule      metFORMIN (GLUCOPHAGE) 500 MG tablet 500 mg daily.     MOVANTIK 25 MG TABS tablet      naloxone (NARCAN) nasal spray 4 mg/0.1 mL      nitrofurantoin (MACRODANTIN) 50 MG capsule Take 50 mg by mouth daily.     nitrofurantoin, macrocrystal-monohydrate, (MACROBID) 100 MG capsule Take by mouth.     nitroGLYCERIN (NITROSTAT) 0.4 MG SL tablet Place 0.4 mg under the tongue every 5 (five) minutes as needed for chest pain.      nystatin cream (MYCOSTATIN)      omeprazole (PRILOSEC) 40 MG capsule Take 40 mg by mouth daily.     pravastatin (PRAVACHOL) 80 MG tablet Take 80 mg by mouth at bedtime.      QUEtiapine (SEROQUEL) 200 MG tablet Take 200 mg by mouth at bedtime.     ranolazine (RANEXA) 500 MG 12 hr tablet Take 1 tablet (500 mg total) by mouth 2 (two) times daily. 180 tablet 3   sertraline (ZOLOFT) 25 MG tablet      solifenacin (VESICARE) 10 MG tablet Take 10 mg by mouth at bedtime.     SURE COMFORT PEN NEEDLES 32G X 6 MM MISC      TRULICITY A999333 0000000 SOPN Inject 1.5 mLs into the skin once a week.     Vitamin D, Ergocalciferol, (DRISDOL) 1.25 MG (50000 UNIT) CAPS capsule Take 50,000 Units by mouth once a week.     No current facility-administered medications on file prior to visit.   Allergies  Allergen Reactions   Aspirin Other (See Comments)   Codeine    Nsaids Other (See Comments)    GI Bleeding   Tolmetin Other (See Comments)    GI Bleeding    No results found for this or any previous visit (from the past 2160 hour(s)).  Objective: There were no vitals filed for this visit.  General: Patient is awake, alert, oriented x 3 and in no acute distress.  Dermatology: Skin is warm and dry bilateral with a prematurely healed wound at the right 3rd toe. There is no malodor, no active drainage, no erythema, no edema. No acute signs of infection.   Vascular: Dorsalis Pedis pulse = 1/4 Bilateral,  Posterior Tibial pulse = 1/4 Bilateral,  Capillary Fill Time < 5 seconds  Neurologic: Protective sensation absent bilateral.   Musculosketal: +Hammertoe, semi-rigid in nature. No pain to palpation to right 3rd toe.    No results for input(s): GRAMSTAIN, LABORGA in the last 8760 hours.  Assessment and Plan:  Problem List Items Addressed This Visit   None Visit Diagnoses     Diabetic ulcer of toe of right foot associated with type 2 diabetes mellitus, limited to breakdown of skin ([place])    -  Primary    Healed   Type II diabetes mellitus with peripheral circulatory disorder (HCC)       Acquired hammertoes of both feet           -Examined patient and discussed the progression of  the wound and treatment alternatives. - Excisionally dedbrided right 3rd toe using a 15 blade with no underlying opening to the area.  Wound appears to be healed. -May discontinue Band-Aid and may discontinue Iodosorb to the area -Advised patient to continue with toe crest pad to prevent the ulceration -Patient at this time declines in office tenotomy and because the wound is healed it is not warranted at this time -Patient to return to office as scheduled for routine foot care or sooner if problems or issues arise.  Landis Martins, DPM

## 2020-10-21 DIAGNOSIS — E663 Overweight: Secondary | ICD-10-CM | POA: Diagnosis not present

## 2020-10-21 DIAGNOSIS — Z6826 Body mass index (BMI) 26.0-26.9, adult: Secondary | ICD-10-CM | POA: Diagnosis not present

## 2020-10-21 DIAGNOSIS — N390 Urinary tract infection, site not specified: Secondary | ICD-10-CM | POA: Diagnosis not present

## 2020-10-22 DIAGNOSIS — I4891 Unspecified atrial fibrillation: Secondary | ICD-10-CM | POA: Diagnosis not present

## 2020-10-22 DIAGNOSIS — I728 Aneurysm of other specified arteries: Secondary | ICD-10-CM | POA: Diagnosis not present

## 2020-10-22 DIAGNOSIS — D3502 Benign neoplasm of left adrenal gland: Secondary | ICD-10-CM | POA: Diagnosis not present

## 2020-10-22 DIAGNOSIS — R011 Cardiac murmur, unspecified: Secondary | ICD-10-CM | POA: Diagnosis not present

## 2020-10-22 DIAGNOSIS — G8929 Other chronic pain: Secondary | ICD-10-CM | POA: Diagnosis not present

## 2020-10-22 DIAGNOSIS — K5900 Constipation, unspecified: Secondary | ICD-10-CM | POA: Diagnosis not present

## 2020-10-22 DIAGNOSIS — Z79891 Long term (current) use of opiate analgesic: Secondary | ICD-10-CM | POA: Diagnosis not present

## 2020-10-22 DIAGNOSIS — Z7984 Long term (current) use of oral hypoglycemic drugs: Secondary | ICD-10-CM | POA: Diagnosis not present

## 2020-10-22 DIAGNOSIS — N3090 Cystitis, unspecified without hematuria: Secondary | ICD-10-CM | POA: Diagnosis not present

## 2020-10-22 DIAGNOSIS — J449 Chronic obstructive pulmonary disease, unspecified: Secondary | ICD-10-CM | POA: Diagnosis not present

## 2020-10-22 DIAGNOSIS — Z79899 Other long term (current) drug therapy: Secondary | ICD-10-CM | POA: Diagnosis not present

## 2020-10-22 DIAGNOSIS — J9610 Chronic respiratory failure, unspecified whether with hypoxia or hypercapnia: Secondary | ICD-10-CM | POA: Diagnosis not present

## 2020-10-22 DIAGNOSIS — I10 Essential (primary) hypertension: Secondary | ICD-10-CM | POA: Diagnosis not present

## 2020-10-22 DIAGNOSIS — Z7901 Long term (current) use of anticoagulants: Secondary | ICD-10-CM | POA: Diagnosis not present

## 2020-10-22 DIAGNOSIS — N390 Urinary tract infection, site not specified: Secondary | ICD-10-CM | POA: Diagnosis not present

## 2020-10-22 DIAGNOSIS — Z792 Long term (current) use of antibiotics: Secondary | ICD-10-CM | POA: Diagnosis not present

## 2020-10-22 DIAGNOSIS — Z9981 Dependence on supplemental oxygen: Secondary | ICD-10-CM | POA: Diagnosis not present

## 2020-10-22 DIAGNOSIS — Z90710 Acquired absence of both cervix and uterus: Secondary | ICD-10-CM | POA: Diagnosis not present

## 2020-10-22 DIAGNOSIS — E119 Type 2 diabetes mellitus without complications: Secondary | ICD-10-CM | POA: Diagnosis not present

## 2020-10-22 DIAGNOSIS — Z9049 Acquired absence of other specified parts of digestive tract: Secondary | ICD-10-CM | POA: Diagnosis not present

## 2020-10-22 DIAGNOSIS — N281 Cyst of kidney, acquired: Secondary | ICD-10-CM | POA: Diagnosis not present

## 2020-10-25 DIAGNOSIS — I10 Essential (primary) hypertension: Secondary | ICD-10-CM | POA: Diagnosis not present

## 2020-10-25 DIAGNOSIS — R262 Difficulty in walking, not elsewhere classified: Secondary | ICD-10-CM | POA: Diagnosis not present

## 2020-10-25 DIAGNOSIS — I4891 Unspecified atrial fibrillation: Secondary | ICD-10-CM | POA: Diagnosis not present

## 2020-10-25 DIAGNOSIS — K5903 Drug induced constipation: Secondary | ICD-10-CM | POA: Diagnosis not present

## 2020-10-25 DIAGNOSIS — I25110 Atherosclerotic heart disease of native coronary artery with unstable angina pectoris: Secondary | ICD-10-CM | POA: Diagnosis not present

## 2020-10-25 DIAGNOSIS — R0902 Hypoxemia: Secondary | ICD-10-CM | POA: Diagnosis not present

## 2020-10-25 DIAGNOSIS — N183 Chronic kidney disease, stage 3 unspecified: Secondary | ICD-10-CM | POA: Diagnosis not present

## 2020-10-25 DIAGNOSIS — I959 Hypotension, unspecified: Secondary | ICD-10-CM | POA: Diagnosis not present

## 2020-10-25 DIAGNOSIS — I700 Atherosclerosis of aorta: Secondary | ICD-10-CM | POA: Diagnosis not present

## 2020-10-25 DIAGNOSIS — E1122 Type 2 diabetes mellitus with diabetic chronic kidney disease: Secondary | ICD-10-CM | POA: Diagnosis not present

## 2020-10-25 DIAGNOSIS — Z7984 Long term (current) use of oral hypoglycemic drugs: Secondary | ICD-10-CM | POA: Diagnosis not present

## 2020-10-25 DIAGNOSIS — J449 Chronic obstructive pulmonary disease, unspecified: Secondary | ICD-10-CM | POA: Diagnosis not present

## 2020-10-25 DIAGNOSIS — Z20822 Contact with and (suspected) exposure to covid-19: Secondary | ICD-10-CM | POA: Diagnosis not present

## 2020-10-25 DIAGNOSIS — I480 Paroxysmal atrial fibrillation: Secondary | ICD-10-CM | POA: Diagnosis not present

## 2020-10-25 DIAGNOSIS — R0789 Other chest pain: Secondary | ICD-10-CM | POA: Diagnosis not present

## 2020-10-25 DIAGNOSIS — G8929 Other chronic pain: Secondary | ICD-10-CM | POA: Diagnosis not present

## 2020-10-25 DIAGNOSIS — E039 Hypothyroidism, unspecified: Secondary | ICD-10-CM | POA: Diagnosis not present

## 2020-10-25 DIAGNOSIS — I350 Nonrheumatic aortic (valve) stenosis: Secondary | ICD-10-CM | POA: Diagnosis not present

## 2020-10-25 DIAGNOSIS — T40695A Adverse effect of other narcotics, initial encounter: Secondary | ICD-10-CM | POA: Diagnosis not present

## 2020-10-25 DIAGNOSIS — M25551 Pain in right hip: Secondary | ICD-10-CM | POA: Diagnosis not present

## 2020-10-25 DIAGNOSIS — E785 Hyperlipidemia, unspecified: Secondary | ICD-10-CM | POA: Diagnosis not present

## 2020-10-25 DIAGNOSIS — N390 Urinary tract infection, site not specified: Secondary | ICD-10-CM | POA: Diagnosis not present

## 2020-10-25 DIAGNOSIS — I252 Old myocardial infarction: Secondary | ICD-10-CM | POA: Diagnosis not present

## 2020-10-25 DIAGNOSIS — I129 Hypertensive chronic kidney disease with stage 1 through stage 4 chronic kidney disease, or unspecified chronic kidney disease: Secondary | ICD-10-CM | POA: Diagnosis not present

## 2020-10-25 DIAGNOSIS — I200 Unstable angina: Secondary | ICD-10-CM | POA: Diagnosis not present

## 2020-10-25 DIAGNOSIS — Z9981 Dependence on supplemental oxygen: Secondary | ICD-10-CM | POA: Diagnosis not present

## 2020-10-25 DIAGNOSIS — Z7901 Long term (current) use of anticoagulants: Secondary | ICD-10-CM | POA: Diagnosis not present

## 2020-10-25 DIAGNOSIS — R079 Chest pain, unspecified: Secondary | ICD-10-CM | POA: Diagnosis not present

## 2020-10-25 DIAGNOSIS — R32 Unspecified urinary incontinence: Secondary | ICD-10-CM | POA: Diagnosis not present

## 2020-10-25 DIAGNOSIS — I25119 Atherosclerotic heart disease of native coronary artery with unspecified angina pectoris: Secondary | ICD-10-CM | POA: Diagnosis not present

## 2020-10-25 DIAGNOSIS — Z79899 Other long term (current) drug therapy: Secondary | ICD-10-CM | POA: Diagnosis not present

## 2020-10-25 DIAGNOSIS — I517 Cardiomegaly: Secondary | ICD-10-CM | POA: Diagnosis not present

## 2020-10-26 DIAGNOSIS — R079 Chest pain, unspecified: Secondary | ICD-10-CM | POA: Diagnosis not present

## 2020-10-26 DIAGNOSIS — R262 Difficulty in walking, not elsewhere classified: Secondary | ICD-10-CM | POA: Diagnosis not present

## 2020-10-26 DIAGNOSIS — E785 Hyperlipidemia, unspecified: Secondary | ICD-10-CM | POA: Diagnosis not present

## 2020-10-26 DIAGNOSIS — E039 Hypothyroidism, unspecified: Secondary | ICD-10-CM | POA: Diagnosis not present

## 2020-10-26 DIAGNOSIS — I200 Unstable angina: Secondary | ICD-10-CM | POA: Diagnosis not present

## 2020-10-26 DIAGNOSIS — G8929 Other chronic pain: Secondary | ICD-10-CM | POA: Diagnosis not present

## 2020-10-26 DIAGNOSIS — N183 Chronic kidney disease, stage 3 unspecified: Secondary | ICD-10-CM | POA: Diagnosis not present

## 2020-10-26 DIAGNOSIS — E1122 Type 2 diabetes mellitus with diabetic chronic kidney disease: Secondary | ICD-10-CM | POA: Diagnosis not present

## 2020-10-26 DIAGNOSIS — I25119 Atherosclerotic heart disease of native coronary artery with unspecified angina pectoris: Secondary | ICD-10-CM | POA: Diagnosis not present

## 2020-10-26 DIAGNOSIS — I129 Hypertensive chronic kidney disease with stage 1 through stage 4 chronic kidney disease, or unspecified chronic kidney disease: Secondary | ICD-10-CM | POA: Diagnosis not present

## 2020-10-26 DIAGNOSIS — I4891 Unspecified atrial fibrillation: Secondary | ICD-10-CM | POA: Diagnosis not present

## 2020-10-26 DIAGNOSIS — N390 Urinary tract infection, site not specified: Secondary | ICD-10-CM | POA: Diagnosis not present

## 2020-10-27 DIAGNOSIS — S8991XA Unspecified injury of right lower leg, initial encounter: Secondary | ICD-10-CM | POA: Diagnosis not present

## 2020-10-27 DIAGNOSIS — S0083XA Contusion of other part of head, initial encounter: Secondary | ICD-10-CM | POA: Diagnosis present

## 2020-10-27 DIAGNOSIS — J45909 Unspecified asthma, uncomplicated: Secondary | ICD-10-CM | POA: Diagnosis not present

## 2020-10-27 DIAGNOSIS — G8929 Other chronic pain: Secondary | ICD-10-CM | POA: Diagnosis not present

## 2020-10-27 DIAGNOSIS — Z87891 Personal history of nicotine dependence: Secondary | ICD-10-CM | POA: Diagnosis not present

## 2020-10-27 DIAGNOSIS — I129 Hypertensive chronic kidney disease with stage 1 through stage 4 chronic kidney disease, or unspecified chronic kidney disease: Secondary | ICD-10-CM | POA: Diagnosis present

## 2020-10-27 DIAGNOSIS — I25119 Atherosclerotic heart disease of native coronary artery with unspecified angina pectoris: Secondary | ICD-10-CM | POA: Diagnosis not present

## 2020-10-27 DIAGNOSIS — I480 Paroxysmal atrial fibrillation: Secondary | ICD-10-CM | POA: Diagnosis not present

## 2020-10-27 DIAGNOSIS — M25461 Effusion, right knee: Secondary | ICD-10-CM | POA: Diagnosis not present

## 2020-10-27 DIAGNOSIS — Z96651 Presence of right artificial knee joint: Secondary | ICD-10-CM | POA: Diagnosis not present

## 2020-10-27 DIAGNOSIS — Z20822 Contact with and (suspected) exposure to covid-19: Secondary | ICD-10-CM | POA: Diagnosis present

## 2020-10-27 DIAGNOSIS — Z043 Encounter for examination and observation following other accident: Secondary | ICD-10-CM | POA: Diagnosis not present

## 2020-10-27 DIAGNOSIS — N189 Chronic kidney disease, unspecified: Secondary | ICD-10-CM | POA: Diagnosis not present

## 2020-10-27 DIAGNOSIS — R0902 Hypoxemia: Secondary | ICD-10-CM | POA: Diagnosis not present

## 2020-10-27 DIAGNOSIS — Z794 Long term (current) use of insulin: Secondary | ICD-10-CM | POA: Diagnosis not present

## 2020-10-27 DIAGNOSIS — J4520 Mild intermittent asthma, uncomplicated: Secondary | ICD-10-CM | POA: Diagnosis not present

## 2020-10-27 DIAGNOSIS — E559 Vitamin D deficiency, unspecified: Secondary | ICD-10-CM | POA: Diagnosis not present

## 2020-10-27 DIAGNOSIS — S40011A Contusion of right shoulder, initial encounter: Secondary | ICD-10-CM | POA: Diagnosis present

## 2020-10-27 DIAGNOSIS — E1122 Type 2 diabetes mellitus with diabetic chronic kidney disease: Secondary | ICD-10-CM | POA: Diagnosis present

## 2020-10-27 DIAGNOSIS — N3289 Other specified disorders of bladder: Secondary | ICD-10-CM | POA: Diagnosis not present

## 2020-10-27 DIAGNOSIS — F419 Anxiety disorder, unspecified: Secondary | ICD-10-CM | POA: Diagnosis present

## 2020-10-27 DIAGNOSIS — Z7901 Long term (current) use of anticoagulants: Secondary | ICD-10-CM | POA: Diagnosis not present

## 2020-10-27 DIAGNOSIS — M5136 Other intervertebral disc degeneration, lumbar region: Secondary | ICD-10-CM | POA: Diagnosis not present

## 2020-10-27 DIAGNOSIS — S79911A Unspecified injury of right hip, initial encounter: Secondary | ICD-10-CM | POA: Diagnosis not present

## 2020-10-27 DIAGNOSIS — Z8719 Personal history of other diseases of the digestive system: Secondary | ICD-10-CM | POA: Diagnosis not present

## 2020-10-27 DIAGNOSIS — M160 Bilateral primary osteoarthritis of hip: Secondary | ICD-10-CM | POA: Diagnosis not present

## 2020-10-27 DIAGNOSIS — R2681 Unsteadiness on feet: Secondary | ICD-10-CM | POA: Diagnosis not present

## 2020-10-27 DIAGNOSIS — S06320A Contusion and laceration of left cerebrum without loss of consciousness, initial encounter: Secondary | ICD-10-CM | POA: Diagnosis not present

## 2020-10-27 DIAGNOSIS — I10 Essential (primary) hypertension: Secondary | ICD-10-CM | POA: Diagnosis not present

## 2020-10-27 DIAGNOSIS — R079 Chest pain, unspecified: Secondary | ICD-10-CM | POA: Diagnosis not present

## 2020-10-27 DIAGNOSIS — R52 Pain, unspecified: Secondary | ICD-10-CM | POA: Diagnosis not present

## 2020-10-27 DIAGNOSIS — M19011 Primary osteoarthritis, right shoulder: Secondary | ICD-10-CM | POA: Diagnosis not present

## 2020-10-27 DIAGNOSIS — M25551 Pain in right hip: Secondary | ICD-10-CM | POA: Diagnosis not present

## 2020-10-27 DIAGNOSIS — R5381 Other malaise: Secondary | ICD-10-CM | POA: Diagnosis not present

## 2020-10-27 DIAGNOSIS — I2510 Atherosclerotic heart disease of native coronary artery without angina pectoris: Secondary | ICD-10-CM | POA: Diagnosis present

## 2020-10-27 DIAGNOSIS — Z01818 Encounter for other preprocedural examination: Secondary | ICD-10-CM | POA: Diagnosis not present

## 2020-10-27 DIAGNOSIS — J309 Allergic rhinitis, unspecified: Secondary | ICD-10-CM | POA: Diagnosis not present

## 2020-10-27 DIAGNOSIS — G4733 Obstructive sleep apnea (adult) (pediatric): Secondary | ICD-10-CM | POA: Diagnosis not present

## 2020-10-27 DIAGNOSIS — Z79891 Long term (current) use of opiate analgesic: Secondary | ICD-10-CM | POA: Insufficient documentation

## 2020-10-27 DIAGNOSIS — J9611 Chronic respiratory failure with hypoxia: Secondary | ICD-10-CM | POA: Diagnosis present

## 2020-10-27 DIAGNOSIS — R262 Difficulty in walking, not elsewhere classified: Secondary | ICD-10-CM | POA: Diagnosis not present

## 2020-10-27 DIAGNOSIS — G894 Chronic pain syndrome: Secondary | ICD-10-CM | POA: Diagnosis not present

## 2020-10-27 DIAGNOSIS — K219 Gastro-esophageal reflux disease without esophagitis: Secondary | ICD-10-CM | POA: Diagnosis not present

## 2020-10-27 DIAGNOSIS — Z9981 Dependence on supplemental oxygen: Secondary | ICD-10-CM | POA: Diagnosis not present

## 2020-10-27 DIAGNOSIS — N200 Calculus of kidney: Secondary | ICD-10-CM | POA: Diagnosis not present

## 2020-10-27 DIAGNOSIS — E039 Hypothyroidism, unspecified: Secondary | ICD-10-CM | POA: Diagnosis present

## 2020-10-27 DIAGNOSIS — I517 Cardiomegaly: Secondary | ICD-10-CM | POA: Diagnosis not present

## 2020-10-27 DIAGNOSIS — I200 Unstable angina: Secondary | ICD-10-CM | POA: Diagnosis not present

## 2020-10-27 DIAGNOSIS — R220 Localized swelling, mass and lump, head: Secondary | ICD-10-CM | POA: Diagnosis not present

## 2020-10-27 DIAGNOSIS — I700 Atherosclerosis of aorta: Secondary | ICD-10-CM | POA: Diagnosis not present

## 2020-10-27 DIAGNOSIS — E119 Type 2 diabetes mellitus without complications: Secondary | ICD-10-CM | POA: Diagnosis not present

## 2020-10-27 DIAGNOSIS — E1151 Type 2 diabetes mellitus with diabetic peripheral angiopathy without gangrene: Secondary | ICD-10-CM | POA: Diagnosis present

## 2020-10-27 DIAGNOSIS — I252 Old myocardial infarction: Secondary | ICD-10-CM | POA: Diagnosis not present

## 2020-10-27 DIAGNOSIS — J9601 Acute respiratory failure with hypoxia: Secondary | ICD-10-CM | POA: Diagnosis not present

## 2020-10-27 DIAGNOSIS — I4891 Unspecified atrial fibrillation: Secondary | ICD-10-CM | POA: Diagnosis not present

## 2020-10-27 DIAGNOSIS — J449 Chronic obstructive pulmonary disease, unspecified: Secondary | ICD-10-CM | POA: Diagnosis present

## 2020-10-27 DIAGNOSIS — F329 Major depressive disorder, single episode, unspecified: Secondary | ICD-10-CM | POA: Diagnosis not present

## 2020-10-27 DIAGNOSIS — Z66 Do not resuscitate: Secondary | ICD-10-CM | POA: Diagnosis present

## 2020-10-27 DIAGNOSIS — J9610 Chronic respiratory failure, unspecified whether with hypoxia or hypercapnia: Secondary | ICD-10-CM | POA: Diagnosis not present

## 2020-10-27 DIAGNOSIS — E785 Hyperlipidemia, unspecified: Secondary | ICD-10-CM | POA: Diagnosis present

## 2020-10-27 DIAGNOSIS — M1611 Unilateral primary osteoarthritis, right hip: Secondary | ICD-10-CM | POA: Diagnosis present

## 2020-10-27 DIAGNOSIS — F32A Depression, unspecified: Secondary | ICD-10-CM | POA: Diagnosis present

## 2020-10-27 DIAGNOSIS — S0093XA Contusion of unspecified part of head, initial encounter: Secondary | ICD-10-CM | POA: Diagnosis not present

## 2020-10-27 DIAGNOSIS — N183 Chronic kidney disease, stage 3 unspecified: Secondary | ICD-10-CM | POA: Diagnosis not present

## 2020-10-27 DIAGNOSIS — I482 Chronic atrial fibrillation, unspecified: Secondary | ICD-10-CM | POA: Diagnosis present

## 2020-10-27 DIAGNOSIS — R269 Unspecified abnormalities of gait and mobility: Secondary | ICD-10-CM | POA: Diagnosis not present

## 2020-10-27 DIAGNOSIS — R300 Dysuria: Secondary | ICD-10-CM | POA: Diagnosis not present

## 2020-10-27 DIAGNOSIS — I25110 Atherosclerotic heart disease of native coronary artery with unstable angina pectoris: Secondary | ICD-10-CM | POA: Diagnosis not present

## 2020-10-27 DIAGNOSIS — N390 Urinary tract infection, site not specified: Secondary | ICD-10-CM | POA: Diagnosis not present

## 2020-10-27 DIAGNOSIS — M6281 Muscle weakness (generalized): Secondary | ICD-10-CM | POA: Diagnosis not present

## 2020-10-27 DIAGNOSIS — R0602 Shortness of breath: Secondary | ICD-10-CM | POA: Diagnosis not present

## 2020-11-01 DIAGNOSIS — M160 Bilateral primary osteoarthritis of hip: Secondary | ICD-10-CM | POA: Diagnosis not present

## 2020-11-01 DIAGNOSIS — E119 Type 2 diabetes mellitus without complications: Secondary | ICD-10-CM | POA: Diagnosis not present

## 2020-11-01 DIAGNOSIS — M5136 Other intervertebral disc degeneration, lumbar region: Secondary | ICD-10-CM | POA: Diagnosis not present

## 2020-11-01 DIAGNOSIS — F32A Depression, unspecified: Secondary | ICD-10-CM | POA: Diagnosis not present

## 2020-11-01 DIAGNOSIS — I129 Hypertensive chronic kidney disease with stage 1 through stage 4 chronic kidney disease, or unspecified chronic kidney disease: Secondary | ICD-10-CM | POA: Diagnosis not present

## 2020-11-01 DIAGNOSIS — F419 Anxiety disorder, unspecified: Secondary | ICD-10-CM | POA: Diagnosis not present

## 2020-11-01 DIAGNOSIS — I700 Atherosclerosis of aorta: Secondary | ICD-10-CM | POA: Diagnosis not present

## 2020-11-01 DIAGNOSIS — S79911A Unspecified injury of right hip, initial encounter: Secondary | ICD-10-CM | POA: Diagnosis not present

## 2020-11-01 DIAGNOSIS — M25461 Effusion, right knee: Secondary | ICD-10-CM | POA: Diagnosis not present

## 2020-11-01 DIAGNOSIS — Z01818 Encounter for other preprocedural examination: Secondary | ICD-10-CM | POA: Diagnosis not present

## 2020-11-01 DIAGNOSIS — I517 Cardiomegaly: Secondary | ICD-10-CM | POA: Diagnosis not present

## 2020-11-01 DIAGNOSIS — S40011A Contusion of right shoulder, initial encounter: Secondary | ICD-10-CM | POA: Diagnosis not present

## 2020-11-01 DIAGNOSIS — J9611 Chronic respiratory failure with hypoxia: Secondary | ICD-10-CM | POA: Diagnosis not present

## 2020-11-01 DIAGNOSIS — I10 Essential (primary) hypertension: Secondary | ICD-10-CM | POA: Diagnosis not present

## 2020-11-01 DIAGNOSIS — S0083XA Contusion of other part of head, initial encounter: Secondary | ICD-10-CM | POA: Diagnosis not present

## 2020-11-01 DIAGNOSIS — R220 Localized swelling, mass and lump, head: Secondary | ICD-10-CM | POA: Diagnosis not present

## 2020-11-01 DIAGNOSIS — S0093XA Contusion of unspecified part of head, initial encounter: Secondary | ICD-10-CM | POA: Diagnosis not present

## 2020-11-01 DIAGNOSIS — S8991XA Unspecified injury of right lower leg, initial encounter: Secondary | ICD-10-CM | POA: Diagnosis not present

## 2020-11-01 DIAGNOSIS — M19011 Primary osteoarthritis, right shoulder: Secondary | ICD-10-CM | POA: Diagnosis not present

## 2020-11-01 DIAGNOSIS — I482 Chronic atrial fibrillation, unspecified: Secondary | ICD-10-CM | POA: Diagnosis not present

## 2020-11-01 DIAGNOSIS — Z96651 Presence of right artificial knee joint: Secondary | ICD-10-CM | POA: Diagnosis not present

## 2020-11-01 DIAGNOSIS — M25551 Pain in right hip: Secondary | ICD-10-CM | POA: Diagnosis not present

## 2020-11-01 DIAGNOSIS — S06320A Contusion and laceration of left cerebrum without loss of consciousness, initial encounter: Secondary | ICD-10-CM | POA: Diagnosis not present

## 2020-11-01 DIAGNOSIS — M1611 Unilateral primary osteoarthritis, right hip: Secondary | ICD-10-CM | POA: Diagnosis not present

## 2020-11-01 DIAGNOSIS — Z043 Encounter for examination and observation following other accident: Secondary | ICD-10-CM | POA: Diagnosis not present

## 2020-11-01 DIAGNOSIS — E039 Hypothyroidism, unspecified: Secondary | ICD-10-CM | POA: Diagnosis not present

## 2020-11-02 DIAGNOSIS — G8929 Other chronic pain: Secondary | ICD-10-CM | POA: Diagnosis not present

## 2020-11-02 DIAGNOSIS — I129 Hypertensive chronic kidney disease with stage 1 through stage 4 chronic kidney disease, or unspecified chronic kidney disease: Secondary | ICD-10-CM | POA: Diagnosis present

## 2020-11-02 DIAGNOSIS — S40011A Contusion of right shoulder, initial encounter: Secondary | ICD-10-CM | POA: Diagnosis present

## 2020-11-02 DIAGNOSIS — I482 Chronic atrial fibrillation, unspecified: Secondary | ICD-10-CM | POA: Diagnosis present

## 2020-11-02 DIAGNOSIS — I2510 Atherosclerotic heart disease of native coronary artery without angina pectoris: Secondary | ICD-10-CM | POA: Diagnosis present

## 2020-11-02 DIAGNOSIS — F32A Depression, unspecified: Secondary | ICD-10-CM | POA: Diagnosis present

## 2020-11-02 DIAGNOSIS — N183 Chronic kidney disease, stage 3 unspecified: Secondary | ICD-10-CM | POA: Diagnosis present

## 2020-11-02 DIAGNOSIS — I252 Old myocardial infarction: Secondary | ICD-10-CM | POA: Diagnosis not present

## 2020-11-02 DIAGNOSIS — E119 Type 2 diabetes mellitus without complications: Secondary | ICD-10-CM | POA: Diagnosis not present

## 2020-11-02 DIAGNOSIS — S0083XA Contusion of other part of head, initial encounter: Secondary | ICD-10-CM | POA: Diagnosis present

## 2020-11-02 DIAGNOSIS — Z9981 Dependence on supplemental oxygen: Secondary | ICD-10-CM | POA: Diagnosis not present

## 2020-11-02 DIAGNOSIS — M1611 Unilateral primary osteoarthritis, right hip: Secondary | ICD-10-CM | POA: Diagnosis present

## 2020-11-02 DIAGNOSIS — J449 Chronic obstructive pulmonary disease, unspecified: Secondary | ICD-10-CM | POA: Diagnosis present

## 2020-11-02 DIAGNOSIS — I10 Essential (primary) hypertension: Secondary | ICD-10-CM | POA: Diagnosis not present

## 2020-11-02 DIAGNOSIS — E1122 Type 2 diabetes mellitus with diabetic chronic kidney disease: Secondary | ICD-10-CM | POA: Diagnosis present

## 2020-11-02 DIAGNOSIS — M25551 Pain in right hip: Secondary | ICD-10-CM | POA: Diagnosis not present

## 2020-11-02 DIAGNOSIS — E039 Hypothyroidism, unspecified: Secondary | ICD-10-CM | POA: Diagnosis present

## 2020-11-02 DIAGNOSIS — E785 Hyperlipidemia, unspecified: Secondary | ICD-10-CM | POA: Diagnosis present

## 2020-11-02 DIAGNOSIS — Z794 Long term (current) use of insulin: Secondary | ICD-10-CM | POA: Diagnosis not present

## 2020-11-02 DIAGNOSIS — E1151 Type 2 diabetes mellitus with diabetic peripheral angiopathy without gangrene: Secondary | ICD-10-CM | POA: Diagnosis present

## 2020-11-02 DIAGNOSIS — Z87891 Personal history of nicotine dependence: Secondary | ICD-10-CM | POA: Diagnosis not present

## 2020-11-02 DIAGNOSIS — Z7901 Long term (current) use of anticoagulants: Secondary | ICD-10-CM | POA: Diagnosis not present

## 2020-11-02 DIAGNOSIS — Z66 Do not resuscitate: Secondary | ICD-10-CM | POA: Diagnosis present

## 2020-11-02 DIAGNOSIS — F419 Anxiety disorder, unspecified: Secondary | ICD-10-CM | POA: Diagnosis present

## 2020-11-02 DIAGNOSIS — Z20822 Contact with and (suspected) exposure to covid-19: Secondary | ICD-10-CM | POA: Diagnosis present

## 2020-11-02 DIAGNOSIS — R0602 Shortness of breath: Secondary | ICD-10-CM | POA: Diagnosis not present

## 2020-11-02 DIAGNOSIS — J9601 Acute respiratory failure with hypoxia: Secondary | ICD-10-CM | POA: Diagnosis not present

## 2020-11-02 DIAGNOSIS — J9611 Chronic respiratory failure with hypoxia: Secondary | ICD-10-CM | POA: Diagnosis present

## 2020-11-06 DIAGNOSIS — I2510 Atherosclerotic heart disease of native coronary artery without angina pectoris: Secondary | ICD-10-CM | POA: Diagnosis not present

## 2020-11-06 DIAGNOSIS — R262 Difficulty in walking, not elsewhere classified: Secondary | ICD-10-CM | POA: Diagnosis not present

## 2020-11-06 DIAGNOSIS — R300 Dysuria: Secondary | ICD-10-CM | POA: Diagnosis not present

## 2020-11-06 DIAGNOSIS — R5381 Other malaise: Secondary | ICD-10-CM | POA: Diagnosis not present

## 2020-11-06 DIAGNOSIS — G894 Chronic pain syndrome: Secondary | ICD-10-CM | POA: Diagnosis not present

## 2020-11-06 DIAGNOSIS — R079 Chest pain, unspecified: Secondary | ICD-10-CM | POA: Diagnosis not present

## 2020-11-06 DIAGNOSIS — M25551 Pain in right hip: Secondary | ICD-10-CM | POA: Diagnosis not present

## 2020-11-06 DIAGNOSIS — F419 Anxiety disorder, unspecified: Secondary | ICD-10-CM | POA: Diagnosis not present

## 2020-11-08 DIAGNOSIS — F419 Anxiety disorder, unspecified: Secondary | ICD-10-CM | POA: Diagnosis not present

## 2020-11-08 DIAGNOSIS — I2510 Atherosclerotic heart disease of native coronary artery without angina pectoris: Secondary | ICD-10-CM | POA: Diagnosis not present

## 2020-11-08 DIAGNOSIS — R079 Chest pain, unspecified: Secondary | ICD-10-CM | POA: Diagnosis not present

## 2020-11-08 DIAGNOSIS — R5381 Other malaise: Secondary | ICD-10-CM | POA: Diagnosis not present

## 2020-11-08 DIAGNOSIS — G894 Chronic pain syndrome: Secondary | ICD-10-CM | POA: Diagnosis not present

## 2020-11-08 DIAGNOSIS — R262 Difficulty in walking, not elsewhere classified: Secondary | ICD-10-CM | POA: Diagnosis not present

## 2020-11-08 DIAGNOSIS — R300 Dysuria: Secondary | ICD-10-CM | POA: Diagnosis not present

## 2020-11-08 DIAGNOSIS — M25551 Pain in right hip: Secondary | ICD-10-CM | POA: Diagnosis not present

## 2020-11-13 DIAGNOSIS — R262 Difficulty in walking, not elsewhere classified: Secondary | ICD-10-CM | POA: Diagnosis not present

## 2020-11-13 DIAGNOSIS — I2510 Atherosclerotic heart disease of native coronary artery without angina pectoris: Secondary | ICD-10-CM | POA: Diagnosis not present

## 2020-11-13 DIAGNOSIS — R300 Dysuria: Secondary | ICD-10-CM | POA: Diagnosis not present

## 2020-11-13 DIAGNOSIS — R079 Chest pain, unspecified: Secondary | ICD-10-CM | POA: Diagnosis not present

## 2020-11-13 DIAGNOSIS — F419 Anxiety disorder, unspecified: Secondary | ICD-10-CM | POA: Diagnosis not present

## 2020-11-13 DIAGNOSIS — R5381 Other malaise: Secondary | ICD-10-CM | POA: Diagnosis not present

## 2020-11-13 DIAGNOSIS — M25551 Pain in right hip: Secondary | ICD-10-CM | POA: Diagnosis not present

## 2020-11-13 DIAGNOSIS — G894 Chronic pain syndrome: Secondary | ICD-10-CM | POA: Diagnosis not present

## 2020-11-22 DIAGNOSIS — N390 Urinary tract infection, site not specified: Secondary | ICD-10-CM | POA: Diagnosis not present

## 2020-11-22 DIAGNOSIS — R5381 Other malaise: Secondary | ICD-10-CM | POA: Diagnosis not present

## 2020-11-30 DIAGNOSIS — R262 Difficulty in walking, not elsewhere classified: Secondary | ICD-10-CM | POA: Diagnosis not present

## 2020-11-30 DIAGNOSIS — E785 Hyperlipidemia, unspecified: Secondary | ICD-10-CM | POA: Diagnosis not present

## 2020-11-30 DIAGNOSIS — I10 Essential (primary) hypertension: Secondary | ICD-10-CM | POA: Diagnosis not present

## 2020-11-30 DIAGNOSIS — G894 Chronic pain syndrome: Secondary | ICD-10-CM | POA: Diagnosis not present

## 2020-11-30 DIAGNOSIS — R5381 Other malaise: Secondary | ICD-10-CM | POA: Diagnosis not present

## 2020-11-30 DIAGNOSIS — J449 Chronic obstructive pulmonary disease, unspecified: Secondary | ICD-10-CM | POA: Diagnosis not present

## 2020-12-04 DIAGNOSIS — G4733 Obstructive sleep apnea (adult) (pediatric): Secondary | ICD-10-CM | POA: Diagnosis not present

## 2020-12-04 DIAGNOSIS — J4520 Mild intermittent asthma, uncomplicated: Secondary | ICD-10-CM | POA: Diagnosis not present

## 2020-12-04 DIAGNOSIS — E559 Vitamin D deficiency, unspecified: Secondary | ICD-10-CM | POA: Diagnosis not present

## 2020-12-04 DIAGNOSIS — J309 Allergic rhinitis, unspecified: Secondary | ICD-10-CM | POA: Diagnosis not present

## 2020-12-05 DIAGNOSIS — N200 Calculus of kidney: Secondary | ICD-10-CM | POA: Diagnosis not present

## 2020-12-05 DIAGNOSIS — N390 Urinary tract infection, site not specified: Secondary | ICD-10-CM | POA: Diagnosis not present

## 2020-12-05 DIAGNOSIS — N3289 Other specified disorders of bladder: Secondary | ICD-10-CM | POA: Diagnosis not present

## 2020-12-10 DIAGNOSIS — Z9981 Dependence on supplemental oxygen: Secondary | ICD-10-CM | POA: Insufficient documentation

## 2020-12-10 DIAGNOSIS — F329 Major depressive disorder, single episode, unspecified: Secondary | ICD-10-CM | POA: Insufficient documentation

## 2020-12-10 DIAGNOSIS — R5381 Other malaise: Secondary | ICD-10-CM | POA: Insufficient documentation

## 2020-12-10 DIAGNOSIS — H409 Unspecified glaucoma: Secondary | ICD-10-CM | POA: Insufficient documentation

## 2020-12-10 DIAGNOSIS — E1151 Type 2 diabetes mellitus with diabetic peripheral angiopathy without gangrene: Secondary | ICD-10-CM | POA: Insufficient documentation

## 2020-12-11 DIAGNOSIS — R262 Difficulty in walking, not elsewhere classified: Secondary | ICD-10-CM | POA: Diagnosis not present

## 2020-12-11 DIAGNOSIS — G894 Chronic pain syndrome: Secondary | ICD-10-CM | POA: Diagnosis not present

## 2020-12-11 DIAGNOSIS — R300 Dysuria: Secondary | ICD-10-CM | POA: Diagnosis not present

## 2020-12-11 DIAGNOSIS — F419 Anxiety disorder, unspecified: Secondary | ICD-10-CM | POA: Diagnosis not present

## 2020-12-11 DIAGNOSIS — R5381 Other malaise: Secondary | ICD-10-CM | POA: Diagnosis not present

## 2020-12-11 DIAGNOSIS — J9610 Chronic respiratory failure, unspecified whether with hypoxia or hypercapnia: Secondary | ICD-10-CM | POA: Diagnosis not present

## 2020-12-11 DIAGNOSIS — M25551 Pain in right hip: Secondary | ICD-10-CM | POA: Insufficient documentation

## 2020-12-11 DIAGNOSIS — I2510 Atherosclerotic heart disease of native coronary artery without angina pectoris: Secondary | ICD-10-CM | POA: Diagnosis not present

## 2020-12-13 DIAGNOSIS — Z9981 Dependence on supplemental oxygen: Secondary | ICD-10-CM | POA: Diagnosis not present

## 2020-12-13 DIAGNOSIS — E1122 Type 2 diabetes mellitus with diabetic chronic kidney disease: Secondary | ICD-10-CM | POA: Diagnosis not present

## 2020-12-13 DIAGNOSIS — H409 Unspecified glaucoma: Secondary | ICD-10-CM | POA: Diagnosis not present

## 2020-12-13 DIAGNOSIS — E039 Hypothyroidism, unspecified: Secondary | ICD-10-CM | POA: Diagnosis not present

## 2020-12-13 DIAGNOSIS — G8929 Other chronic pain: Secondary | ICD-10-CM | POA: Diagnosis not present

## 2020-12-13 DIAGNOSIS — F329 Major depressive disorder, single episode, unspecified: Secondary | ICD-10-CM | POA: Diagnosis not present

## 2020-12-13 DIAGNOSIS — M25551 Pain in right hip: Secondary | ICD-10-CM | POA: Diagnosis not present

## 2020-12-13 DIAGNOSIS — E1151 Type 2 diabetes mellitus with diabetic peripheral angiopathy without gangrene: Secondary | ICD-10-CM | POA: Diagnosis not present

## 2020-12-13 DIAGNOSIS — K219 Gastro-esophageal reflux disease without esophagitis: Secondary | ICD-10-CM | POA: Diagnosis not present

## 2020-12-13 DIAGNOSIS — D649 Anemia, unspecified: Secondary | ICD-10-CM | POA: Diagnosis not present

## 2020-12-13 DIAGNOSIS — J9610 Chronic respiratory failure, unspecified whether with hypoxia or hypercapnia: Secondary | ICD-10-CM | POA: Diagnosis not present

## 2020-12-13 DIAGNOSIS — N183 Chronic kidney disease, stage 3 unspecified: Secondary | ICD-10-CM | POA: Diagnosis not present

## 2020-12-13 DIAGNOSIS — I2510 Atherosclerotic heart disease of native coronary artery without angina pectoris: Secondary | ICD-10-CM | POA: Diagnosis not present

## 2020-12-13 DIAGNOSIS — R5381 Other malaise: Secondary | ICD-10-CM | POA: Diagnosis not present

## 2020-12-13 DIAGNOSIS — J449 Chronic obstructive pulmonary disease, unspecified: Secondary | ICD-10-CM | POA: Diagnosis not present

## 2020-12-13 DIAGNOSIS — I480 Paroxysmal atrial fibrillation: Secondary | ICD-10-CM | POA: Diagnosis not present

## 2020-12-13 DIAGNOSIS — F419 Anxiety disorder, unspecified: Secondary | ICD-10-CM | POA: Diagnosis not present

## 2020-12-13 DIAGNOSIS — I129 Hypertensive chronic kidney disease with stage 1 through stage 4 chronic kidney disease, or unspecified chronic kidney disease: Secondary | ICD-10-CM | POA: Diagnosis not present

## 2020-12-18 DIAGNOSIS — C44619 Basal cell carcinoma of skin of left upper limb, including shoulder: Secondary | ICD-10-CM | POA: Diagnosis not present

## 2020-12-19 DIAGNOSIS — I129 Hypertensive chronic kidney disease with stage 1 through stage 4 chronic kidney disease, or unspecified chronic kidney disease: Secondary | ICD-10-CM | POA: Diagnosis not present

## 2020-12-19 DIAGNOSIS — J449 Chronic obstructive pulmonary disease, unspecified: Secondary | ICD-10-CM | POA: Diagnosis not present

## 2020-12-19 DIAGNOSIS — M25551 Pain in right hip: Secondary | ICD-10-CM | POA: Diagnosis not present

## 2020-12-19 DIAGNOSIS — N183 Chronic kidney disease, stage 3 unspecified: Secondary | ICD-10-CM | POA: Diagnosis not present

## 2020-12-19 DIAGNOSIS — G8929 Other chronic pain: Secondary | ICD-10-CM | POA: Diagnosis not present

## 2020-12-19 DIAGNOSIS — E1122 Type 2 diabetes mellitus with diabetic chronic kidney disease: Secondary | ICD-10-CM | POA: Diagnosis not present

## 2020-12-21 DIAGNOSIS — J449 Chronic obstructive pulmonary disease, unspecified: Secondary | ICD-10-CM | POA: Diagnosis not present

## 2020-12-21 DIAGNOSIS — N183 Chronic kidney disease, stage 3 unspecified: Secondary | ICD-10-CM | POA: Diagnosis not present

## 2020-12-21 DIAGNOSIS — G8929 Other chronic pain: Secondary | ICD-10-CM | POA: Diagnosis not present

## 2020-12-21 DIAGNOSIS — I129 Hypertensive chronic kidney disease with stage 1 through stage 4 chronic kidney disease, or unspecified chronic kidney disease: Secondary | ICD-10-CM | POA: Diagnosis not present

## 2020-12-21 DIAGNOSIS — E1122 Type 2 diabetes mellitus with diabetic chronic kidney disease: Secondary | ICD-10-CM | POA: Diagnosis not present

## 2020-12-21 DIAGNOSIS — M25551 Pain in right hip: Secondary | ICD-10-CM | POA: Diagnosis not present

## 2020-12-22 DIAGNOSIS — N183 Chronic kidney disease, stage 3 unspecified: Secondary | ICD-10-CM | POA: Diagnosis not present

## 2020-12-22 DIAGNOSIS — G4733 Obstructive sleep apnea (adult) (pediatric): Secondary | ICD-10-CM | POA: Diagnosis not present

## 2020-12-22 DIAGNOSIS — I129 Hypertensive chronic kidney disease with stage 1 through stage 4 chronic kidney disease, or unspecified chronic kidney disease: Secondary | ICD-10-CM | POA: Diagnosis not present

## 2020-12-22 DIAGNOSIS — E1122 Type 2 diabetes mellitus with diabetic chronic kidney disease: Secondary | ICD-10-CM | POA: Diagnosis not present

## 2020-12-22 DIAGNOSIS — G8929 Other chronic pain: Secondary | ICD-10-CM | POA: Diagnosis not present

## 2020-12-22 DIAGNOSIS — J449 Chronic obstructive pulmonary disease, unspecified: Secondary | ICD-10-CM | POA: Diagnosis not present

## 2020-12-22 DIAGNOSIS — M25551 Pain in right hip: Secondary | ICD-10-CM | POA: Diagnosis not present

## 2020-12-26 DIAGNOSIS — M25551 Pain in right hip: Secondary | ICD-10-CM | POA: Diagnosis not present

## 2020-12-26 DIAGNOSIS — G8929 Other chronic pain: Secondary | ICD-10-CM | POA: Diagnosis not present

## 2020-12-26 DIAGNOSIS — N183 Chronic kidney disease, stage 3 unspecified: Secondary | ICD-10-CM | POA: Diagnosis not present

## 2020-12-26 DIAGNOSIS — E1122 Type 2 diabetes mellitus with diabetic chronic kidney disease: Secondary | ICD-10-CM | POA: Diagnosis not present

## 2020-12-26 DIAGNOSIS — J449 Chronic obstructive pulmonary disease, unspecified: Secondary | ICD-10-CM | POA: Diagnosis not present

## 2020-12-26 DIAGNOSIS — I129 Hypertensive chronic kidney disease with stage 1 through stage 4 chronic kidney disease, or unspecified chronic kidney disease: Secondary | ICD-10-CM | POA: Diagnosis not present

## 2020-12-27 DIAGNOSIS — E039 Hypothyroidism, unspecified: Secondary | ICD-10-CM | POA: Diagnosis not present

## 2020-12-27 DIAGNOSIS — I10 Essential (primary) hypertension: Secondary | ICD-10-CM | POA: Diagnosis not present

## 2020-12-27 DIAGNOSIS — K219 Gastro-esophageal reflux disease without esophagitis: Secondary | ICD-10-CM | POA: Diagnosis not present

## 2020-12-27 DIAGNOSIS — E538 Deficiency of other specified B group vitamins: Secondary | ICD-10-CM | POA: Diagnosis not present

## 2020-12-27 DIAGNOSIS — Z6827 Body mass index (BMI) 27.0-27.9, adult: Secondary | ICD-10-CM | POA: Diagnosis not present

## 2020-12-27 DIAGNOSIS — E559 Vitamin D deficiency, unspecified: Secondary | ICD-10-CM | POA: Diagnosis not present

## 2020-12-27 DIAGNOSIS — S41109A Unspecified open wound of unspecified upper arm, initial encounter: Secondary | ICD-10-CM | POA: Diagnosis not present

## 2020-12-28 ENCOUNTER — Encounter: Payer: Self-pay | Admitting: Podiatry

## 2020-12-28 ENCOUNTER — Other Ambulatory Visit: Payer: Self-pay

## 2020-12-28 ENCOUNTER — Ambulatory Visit (INDEPENDENT_AMBULATORY_CARE_PROVIDER_SITE_OTHER): Payer: Medicare Other | Admitting: Podiatry

## 2020-12-28 DIAGNOSIS — M79674 Pain in right toe(s): Secondary | ICD-10-CM

## 2020-12-28 DIAGNOSIS — M2042 Other hammer toe(s) (acquired), left foot: Secondary | ICD-10-CM | POA: Diagnosis not present

## 2020-12-28 DIAGNOSIS — L84 Corns and callosities: Secondary | ICD-10-CM

## 2020-12-28 DIAGNOSIS — M2041 Other hammer toe(s) (acquired), right foot: Secondary | ICD-10-CM

## 2020-12-28 DIAGNOSIS — E119 Type 2 diabetes mellitus without complications: Secondary | ICD-10-CM | POA: Diagnosis not present

## 2020-12-28 DIAGNOSIS — E1142 Type 2 diabetes mellitus with diabetic polyneuropathy: Secondary | ICD-10-CM | POA: Diagnosis not present

## 2020-12-28 DIAGNOSIS — M79675 Pain in left toe(s): Secondary | ICD-10-CM | POA: Diagnosis not present

## 2020-12-28 DIAGNOSIS — B351 Tinea unguium: Secondary | ICD-10-CM

## 2020-12-28 DIAGNOSIS — I739 Peripheral vascular disease, unspecified: Secondary | ICD-10-CM | POA: Diagnosis not present

## 2020-12-30 DIAGNOSIS — E1122 Type 2 diabetes mellitus with diabetic chronic kidney disease: Secondary | ICD-10-CM | POA: Diagnosis not present

## 2020-12-30 DIAGNOSIS — G8929 Other chronic pain: Secondary | ICD-10-CM | POA: Diagnosis not present

## 2020-12-30 DIAGNOSIS — I129 Hypertensive chronic kidney disease with stage 1 through stage 4 chronic kidney disease, or unspecified chronic kidney disease: Secondary | ICD-10-CM | POA: Diagnosis not present

## 2020-12-30 DIAGNOSIS — M25551 Pain in right hip: Secondary | ICD-10-CM | POA: Diagnosis not present

## 2020-12-30 DIAGNOSIS — J449 Chronic obstructive pulmonary disease, unspecified: Secondary | ICD-10-CM | POA: Diagnosis not present

## 2020-12-30 DIAGNOSIS — N183 Chronic kidney disease, stage 3 unspecified: Secondary | ICD-10-CM | POA: Diagnosis not present

## 2021-01-01 DIAGNOSIS — I2510 Atherosclerotic heart disease of native coronary artery without angina pectoris: Secondary | ICD-10-CM | POA: Diagnosis not present

## 2021-01-01 DIAGNOSIS — I700 Atherosclerosis of aorta: Secondary | ICD-10-CM | POA: Diagnosis not present

## 2021-01-01 DIAGNOSIS — R0602 Shortness of breath: Secondary | ICD-10-CM | POA: Diagnosis not present

## 2021-01-01 DIAGNOSIS — I728 Aneurysm of other specified arteries: Secondary | ICD-10-CM | POA: Diagnosis not present

## 2021-01-01 DIAGNOSIS — J8410 Pulmonary fibrosis, unspecified: Secondary | ICD-10-CM | POA: Diagnosis not present

## 2021-01-01 NOTE — Progress Notes (Signed)
ANNUAL DIABETIC FOOT EXAM  Subjective: Shelia Sullivan presents today  for annual diabetic foot examination, at risk foot care. Patient has history of ulceration right 3rd digit which she states is much better.  She is also seen for painful thick toenails that are difficult to trim. Pain interferes with ambulation. Aggravating factors include wearing enclosed shoe gear. Pain is relieved with periodic professional debridement..  Patient relates 10 year h/o diabetes.  Patient has h/o foot wound of the right 3rd digit. Patient has been seeing Dr. Cannon Kettle and states this is nearly healed.  Patient denies any symptoms of foot numbness.  Patient denies any symptoms of foot tingling.  Patient denies any symptoms of burning in feet.  Patient denies any symptoms of pins/needles in feet.  Patient did not check blood glucose this morning.  [name], NP is patient's PCP. Last visit was 08/09/2020.  Past Medical History:  Diagnosis Date   Acute chest pain 06/08/2017   Last Assessment & Plan:  Consistent with angina pectoris brought on by atrial fibrillation with rapid ventricular response.  Nuclear scan intermediate risk for significant obstructive coronary artery disease.  Options discussed:  We will proceed with diagnostic cardiac catheterization but forego intervention on an ad Hoc basis as she does have history of a GI bleed has been told not to take aspiri   Anemia    Anxiety 06/08/2017   Anxiety and depression    Arthritis    Asthma    Asthma    Atrophic vaginitis    CAD (coronary artery disease)    Cancer (HCC)    Chronic pain 06/08/2017   Chronic respiratory failure ([place]) 06/08/2017   CKD (chronic kidney disease)    COPD (chronic obstructive pulmonary disease) ([place])    Dyslipidemia 06/09/2017   Last Assessment & Plan:  Low HDL with LDL 76. Low threshold for continued statin therapy   Esophageal reflux    Fibrocystic breast changes of both breasts 06/27/2015   Frequent falls  07/27/2019   Hematuria 01/06/2018   History of GI bleed 06/08/2017   Last Assessment & Plan:  Stable at present with reasonable hemoglobin and hematocrit.  However, as noted above will defer consideration dual antiplatelet therapy at the present time due to need for  direct oral anticoagulant therapy given history of the atrial fibrillation pending review of the results from the diagnostic catheterization   History of MI (myocardial infarction) 06/08/2017   Hypercholesteremia    Hypertension    Hypothyroid    Irritable bladder    Kidney stone    Morbid obesity ([place]) 06/27/2015   Myocardial infarction [facility])    Neuropathy    Nocturia    Osteoarthritis    Paroxysmal atrial fibrillation ([place]) 09/30/2017   Peripheral vascular disease ([place]) 06/08/2017   Rapid atrial fibrillation ([place]) 06/08/2017   Last Assessment & Plan:  Back in sinus rhythm at present but chads Vasc score is at least 4 based on age, gender, history of hypertension and diabetes.  Recommend anticoagulant therapy with a direct oral anticoagulant.   Scars    Stable angina pectoris ([place]) 06/08/2017   Added automatically from request for surgery [phone]   Swelling    Type 2 diabetes mellitus with stage 3 chronic kidney disease ([place])    Type 2 diabetes mellitus, without long-term current use of insulin ([place]) 06/08/2017   Last Assessment & Plan:  Target A1c less than 7.0   Vitamin D deficiency    Vitamin D deficiency  Patient Active Problem List   Diagnosis Date Noted   Vitamin D deficiency    Type 2 diabetes mellitus with stage 3 chronic kidney disease ([place])    Swelling    Scars    Osteoarthritis    Nocturia    Neuropathy    Myocardial infarction ([place])    Irritable bladder    Hypothyroid    Hypertension    Hypercholesteremia    Esophageal reflux    COPD (chronic obstructive pulmonary disease) ([place])    CKD (chronic kidney disease)    Cancer ([place])    CAD (coronary artery disease)    Atrophic vaginitis    Arthritis     Anxiety and depression    Anemia    Frequent falls 07/27/2019   Hematuria 01/06/2018   Paroxysmal atrial fibrillation ([place]) 09/30/2017   Dyslipidemia 06/09/2017   Type 2 diabetes mellitus, without long-term current use of insulin ([place]) 06/08/2017   Stable angina pectoris ([place]) 06/08/2017   Rapid atrial fibrillation ([place]) 06/08/2017   Peripheral vascular disease ([place]) 06/08/2017   History of GI bleed 06/08/2017   History of MI (myocardial infarction) 06/08/2017   Kidney stone 06/08/2017   Chronic respiratory failure ([place]) 06/08/2017   Chronic pain 06/08/2017   Asthma 06/08/2017   Anxiety 06/08/2017   Acute chest pain 06/08/2017   Morbid obesity ([place]) 06/27/2015   Fibrocystic breast changes of both breasts 06/27/2015   Past Surgical History:  Procedure Laterality Date   APPENDECTOMY  1950   ARTHROPLASTY Right 2002   Dr Reeves Forth   Arthroscopy of shoulder     BREAST SURGERY     CARPAL TUNNEL RELEASE Left 2007   CATARACT EXTRACTION Bilateral    [place]   LITHOTRIPSY  02/21/2017   PARTIAL HYSTERECTOMY  1950   REPLACEMENT TOTAL KNEE Left 02/03/2012   TONSILLECTOMY  1940   Current Outpatient Medications on File Prior to Visit  Medication Sig Dispense Refill   ADVAIR DISKUS 250-50 MCG/DOSE AEPB Inhale 1 puff into the lungs 2 (two) times daily at 10 AM and 5 PM.      albuterol (PROVENTIL HFA;VENTOLIN HFA) 108 (90 Base) MCG/ACT inhaler Inhale 1 puff into the lungs every 6 (six) hours as needed for wheezing or shortness of breath.     amoxicillin-clavulanate (AUGMENTIN) 875-125 MG tablet      B-D INSULIN SYRINGE 29G X 1/2" 0.5 ML MISC USE 1 SYRINGE SUBCUTANEOUSLY ONCE EVERY MONTH     Calcium Carb-Cholecalciferol (CALCIUM 600+D3 PO) Take by mouth daily.     carvedilol (COREG) 6.25 MG tablet Take 6.25 mg by mouth 2 (two) times daily.      cephALEXin (KEFLEX) 500 MG capsule Take 500 mg by mouth 3 (three) times daily.     cyanocobalamin (,VITAMIN B-12,)  1000 MCG/ML injection Inject 1,000 mcg into the muscle every 30 (thirty) days.     DULoxetine (CYMBALTA) 30 MG capsule Take 30 mg by mouth daily.     ELIQUIS 5 MG TABS tablet TAKE 1 TABLET TWICE A DAY 90 tablet 1   famotidine (PEPCID) 20 MG tablet Take 20 mg by mouth 2 (two) times daily.     fluticasone (FLONASE) 50 MCG/ACT nasal spray Place into the nose as needed.     gabapentin (NEURONTIN) 600 MG tablet Take 600 mg by mouth every 8 (eight) hours as needed.     HYDROmorphone (DILAUDID) 4 MG tablet TAKE 1 TABLET BY MOUTH EVERY 6 TO 8 HOURS AS  NEEDED FOR CHRONIC PAIN (MAX 4 PER DAY)  0   isosorbide mononitrate (IMDUR) 30 MG 24 hr tablet Take 1 tablet (30 mg total) by mouth daily. 90 tablet 3   LEVEMIR FLEXTOUCH 100 UNIT/ML Pen Inject 50 Units into the skin daily.      levothyroxine (SYNTHROID) 50 MCG tablet Take 50 mcg by mouth daily.     lisinopril (ZESTRIL) 10 MG tablet      lisinopril (ZESTRIL) 5 MG tablet Take 5 mg by mouth daily.     lubiprostone (AMITIZA) 24 MCG capsule      metFORMIN (GLUCOPHAGE) 500 MG tablet 500 mg daily.     MOVANTIK 25 MG TABS tablet      naloxone (NARCAN) nasal spray 4 mg/0.1 mL      nitrofurantoin (MACRODANTIN) 50 MG capsule Take 50 mg by mouth daily.     nitrofurantoin, macrocrystal-monohydrate, (MACROBID) 100 MG capsule Take by mouth.     nitroGLYCERIN (NITROSTAT) 0.4 MG SL tablet Place 0.4 mg under the tongue every 5 (five) minutes as needed for chest pain.     nystatin cream (MYCOSTATIN)      omeprazole (PRILOSEC) 40 MG capsule Take 40 mg by mouth daily.     pravastatin (PRAVACHOL) 80 MG tablet Take 80 mg by mouth at bedtime.      QUEtiapine (SEROQUEL) 200 MG tablet Take 200 mg by mouth at bedtime.     ranolazine (RANEXA) 500 MG 12 hr tablet Take 1 tablet (500 mg total) by mouth 2 (two) times daily. 180 tablet 3   sertraline (ZOLOFT) 25 MG tablet      solifenacin (VESICARE) 10 MG tablet Take 10 mg by mouth at bedtime.     SURE COMFORT PEN NEEDLES 32G X 6 MM  MISC      TRULICITY 8.11 XB/2.6OM SOPN Inject 1.5 mLs into the skin once a week.     Vitamin D, Ergocalciferol, (DRISDOL) 1.25 MG (50000 UNIT) CAPS capsule Take 50,000 Units by mouth once a week.     No current facility-administered medications on file prior to visit.    Allergies  Allergen Reactions   Aspirin Other (See Comments)   Codeine    Nsaids Other (See Comments)    GI Bleeding   Tolmetin Other (See Comments)    GI Bleeding   Social History   Occupational History   Not on file  Tobacco Use   Smoking status: Former    Packs/day: 1.00    Years: 50.00    Pack years: 50.00    Types: Cigarettes    Quit date: 03/1998    Years since quitting: 22.7   Smokeless tobacco: Never  Vaping Use   Vaping Use: Never used  Substance and Sexual Activity   Alcohol use: No   Drug use: No   Sexual activity: Not Currently   Family History  Problem Relation Age of Onset   Hypertension Mother    Hyperlipidemia Mother    Heart disease Father    Stroke Father    Arthritis Father    Hypertension Father    Colon cancer Father    Prostate cancer Father    Diabetes Sister    Breast cancer Daughter     There is no immunization history on file for this patient.   Review of Systems: Negative except as noted in the HPI.   Objective: There were no vitals filed for this visit.  OMA MARZAN is a pleasant 87 y.o. female in NAD. AAO X  3.  Vascular Examination: CFT <5 seconds b/l LE. Faintly palpable DP pulse(s) b/l lower extremities. Faintly palpable PT pulse(s) b/l lower extremities. Pedal hair absent. No pain with calf compression b/l. Lower extremity skin temperature gradient within normal limits. No edema noted b/l LE.  Dermatological Examination: Skin warm and supple b/l lower extremities. No open wounds b/l LE. No interdigital macerations noted b/l LE. Toenails 1-5 b/l elongated, discolored, dystrophic, thickened, crumbly with subungual debris and tenderness to dorsal  palpation. Hyperkeratotic lesion(s) R 3rd toe.  No erythema, no edema, no drainage, no fluctuance.  Musculoskeletal Examination: Normal muscle strength 5/5 to all lower extremity muscle groups bilaterally. Hammertoe(s) noted to the R 3rd toe.  Footwear Assessment: Does the patient wear appropriate shoes? Yes. Does the patient need inserts/orthotics? No.  Neurological Examination: Protective sensation diminished with 10g monofilament b/l. Vibratory sensation diminished b/l.  Assessment: 1. Pain due to onychomycosis of toenails of both feet   2. Corns   3. Acquired hammertoes of both feet   4. PAD (peripheral artery disease) ([place])   5. Diabetic polyneuropathy associated with type 2 diabetes mellitus ([place])   6. Encounter for diabetic foot exam ([place])     ADA Risk Categorization: High Risk  Patient has one or more of the following: Loss of protective sensation Absent pedal pulses Severe Foot deformity History of foot ulcer  Plan: -Examined patient. -Ulcer distal tip right 3rd toe has resolved. -Diabetic foot examination performed today. -Continue diabetic foot care principles: inspect feet daily, monitor glucose as recommended by PCP and/or Endocrinologist, and follow prescribed diet per PCP, Endocrinologist and/or dietician. -Toenails 1-5 b/l were debrided in length and girth with sterile nail nippers and dremel without iatrogenic bleeding.  -Corn(s) R 3rd toe pared utilizing sterile scalpel blade without complication or incident. Total number debrided=1. -Continue toe crest right foot to afftected digits daily for protection. -Patient/POA to call should there be question/concern in the interim.  Return in about 3 months (around 03/30/2021).  Marzetta Board, DPM

## 2021-01-03 DIAGNOSIS — J1289 Other viral pneumonia: Secondary | ICD-10-CM | POA: Diagnosis not present

## 2021-01-03 DIAGNOSIS — J4520 Mild intermittent asthma, uncomplicated: Secondary | ICD-10-CM | POA: Diagnosis not present

## 2021-01-03 DIAGNOSIS — R059 Cough, unspecified: Secondary | ICD-10-CM | POA: Diagnosis not present

## 2021-01-03 DIAGNOSIS — E559 Vitamin D deficiency, unspecified: Secondary | ICD-10-CM | POA: Diagnosis not present

## 2021-01-03 DIAGNOSIS — J309 Allergic rhinitis, unspecified: Secondary | ICD-10-CM | POA: Diagnosis not present

## 2021-01-03 DIAGNOSIS — J479 Bronchiectasis, uncomplicated: Secondary | ICD-10-CM | POA: Diagnosis not present

## 2021-01-03 DIAGNOSIS — G4733 Obstructive sleep apnea (adult) (pediatric): Secondary | ICD-10-CM | POA: Diagnosis not present

## 2021-01-03 DIAGNOSIS — J8410 Pulmonary fibrosis, unspecified: Secondary | ICD-10-CM | POA: Diagnosis not present

## 2021-01-05 DIAGNOSIS — J449 Chronic obstructive pulmonary disease, unspecified: Secondary | ICD-10-CM | POA: Diagnosis not present

## 2021-01-05 DIAGNOSIS — E1122 Type 2 diabetes mellitus with diabetic chronic kidney disease: Secondary | ICD-10-CM | POA: Diagnosis not present

## 2021-01-05 DIAGNOSIS — M25551 Pain in right hip: Secondary | ICD-10-CM | POA: Diagnosis not present

## 2021-01-05 DIAGNOSIS — G8929 Other chronic pain: Secondary | ICD-10-CM | POA: Diagnosis not present

## 2021-01-05 DIAGNOSIS — N183 Chronic kidney disease, stage 3 unspecified: Secondary | ICD-10-CM | POA: Diagnosis not present

## 2021-01-05 DIAGNOSIS — I129 Hypertensive chronic kidney disease with stage 1 through stage 4 chronic kidney disease, or unspecified chronic kidney disease: Secondary | ICD-10-CM | POA: Diagnosis not present

## 2021-01-10 DIAGNOSIS — E1122 Type 2 diabetes mellitus with diabetic chronic kidney disease: Secondary | ICD-10-CM | POA: Diagnosis not present

## 2021-01-10 DIAGNOSIS — I129 Hypertensive chronic kidney disease with stage 1 through stage 4 chronic kidney disease, or unspecified chronic kidney disease: Secondary | ICD-10-CM | POA: Diagnosis not present

## 2021-01-10 DIAGNOSIS — M47816 Spondylosis without myelopathy or radiculopathy, lumbar region: Secondary | ICD-10-CM | POA: Diagnosis not present

## 2021-01-10 DIAGNOSIS — G8929 Other chronic pain: Secondary | ICD-10-CM | POA: Diagnosis not present

## 2021-01-10 DIAGNOSIS — E663 Overweight: Secondary | ICD-10-CM | POA: Diagnosis not present

## 2021-01-10 DIAGNOSIS — Z79891 Long term (current) use of opiate analgesic: Secondary | ICD-10-CM | POA: Diagnosis not present

## 2021-01-10 DIAGNOSIS — Z1389 Encounter for screening for other disorder: Secondary | ICD-10-CM | POA: Diagnosis not present

## 2021-01-10 DIAGNOSIS — G894 Chronic pain syndrome: Secondary | ICD-10-CM | POA: Diagnosis not present

## 2021-01-10 DIAGNOSIS — M5136 Other intervertebral disc degeneration, lumbar region: Secondary | ICD-10-CM | POA: Diagnosis not present

## 2021-01-10 DIAGNOSIS — J449 Chronic obstructive pulmonary disease, unspecified: Secondary | ICD-10-CM | POA: Diagnosis not present

## 2021-01-10 DIAGNOSIS — M25551 Pain in right hip: Secondary | ICD-10-CM | POA: Diagnosis not present

## 2021-01-10 DIAGNOSIS — N183 Chronic kidney disease, stage 3 unspecified: Secondary | ICD-10-CM | POA: Diagnosis not present

## 2021-01-10 DIAGNOSIS — M1611 Unilateral primary osteoarthritis, right hip: Secondary | ICD-10-CM | POA: Diagnosis not present

## 2021-01-12 DIAGNOSIS — F419 Anxiety disorder, unspecified: Secondary | ICD-10-CM | POA: Diagnosis not present

## 2021-01-12 DIAGNOSIS — F329 Major depressive disorder, single episode, unspecified: Secondary | ICD-10-CM | POA: Diagnosis not present

## 2021-01-12 DIAGNOSIS — J449 Chronic obstructive pulmonary disease, unspecified: Secondary | ICD-10-CM | POA: Diagnosis not present

## 2021-01-12 DIAGNOSIS — D649 Anemia, unspecified: Secondary | ICD-10-CM | POA: Diagnosis not present

## 2021-01-12 DIAGNOSIS — I480 Paroxysmal atrial fibrillation: Secondary | ICD-10-CM | POA: Diagnosis not present

## 2021-01-12 DIAGNOSIS — K219 Gastro-esophageal reflux disease without esophagitis: Secondary | ICD-10-CM | POA: Diagnosis not present

## 2021-01-12 DIAGNOSIS — I129 Hypertensive chronic kidney disease with stage 1 through stage 4 chronic kidney disease, or unspecified chronic kidney disease: Secondary | ICD-10-CM | POA: Diagnosis not present

## 2021-01-12 DIAGNOSIS — N183 Chronic kidney disease, stage 3 unspecified: Secondary | ICD-10-CM | POA: Diagnosis not present

## 2021-01-12 DIAGNOSIS — H409 Unspecified glaucoma: Secondary | ICD-10-CM | POA: Diagnosis not present

## 2021-01-12 DIAGNOSIS — J9610 Chronic respiratory failure, unspecified whether with hypoxia or hypercapnia: Secondary | ICD-10-CM | POA: Diagnosis not present

## 2021-01-12 DIAGNOSIS — I2510 Atherosclerotic heart disease of native coronary artery without angina pectoris: Secondary | ICD-10-CM | POA: Diagnosis not present

## 2021-01-12 DIAGNOSIS — G8929 Other chronic pain: Secondary | ICD-10-CM | POA: Diagnosis not present

## 2021-01-12 DIAGNOSIS — E1122 Type 2 diabetes mellitus with diabetic chronic kidney disease: Secondary | ICD-10-CM | POA: Diagnosis not present

## 2021-01-12 DIAGNOSIS — R5381 Other malaise: Secondary | ICD-10-CM | POA: Diagnosis not present

## 2021-01-12 DIAGNOSIS — M25551 Pain in right hip: Secondary | ICD-10-CM | POA: Diagnosis not present

## 2021-01-12 DIAGNOSIS — E1151 Type 2 diabetes mellitus with diabetic peripheral angiopathy without gangrene: Secondary | ICD-10-CM | POA: Diagnosis not present

## 2021-01-12 DIAGNOSIS — E039 Hypothyroidism, unspecified: Secondary | ICD-10-CM | POA: Diagnosis not present

## 2021-01-12 DIAGNOSIS — Z9981 Dependence on supplemental oxygen: Secondary | ICD-10-CM | POA: Diagnosis not present

## 2021-01-15 DIAGNOSIS — I129 Hypertensive chronic kidney disease with stage 1 through stage 4 chronic kidney disease, or unspecified chronic kidney disease: Secondary | ICD-10-CM | POA: Diagnosis not present

## 2021-01-15 DIAGNOSIS — J449 Chronic obstructive pulmonary disease, unspecified: Secondary | ICD-10-CM | POA: Diagnosis not present

## 2021-01-15 DIAGNOSIS — G8929 Other chronic pain: Secondary | ICD-10-CM | POA: Diagnosis not present

## 2021-01-15 DIAGNOSIS — M25551 Pain in right hip: Secondary | ICD-10-CM | POA: Diagnosis not present

## 2021-01-15 DIAGNOSIS — N183 Chronic kidney disease, stage 3 unspecified: Secondary | ICD-10-CM | POA: Diagnosis not present

## 2021-01-15 DIAGNOSIS — E1122 Type 2 diabetes mellitus with diabetic chronic kidney disease: Secondary | ICD-10-CM | POA: Diagnosis not present

## 2021-01-17 DIAGNOSIS — E1122 Type 2 diabetes mellitus with diabetic chronic kidney disease: Secondary | ICD-10-CM | POA: Diagnosis not present

## 2021-01-17 DIAGNOSIS — G8929 Other chronic pain: Secondary | ICD-10-CM | POA: Diagnosis not present

## 2021-01-17 DIAGNOSIS — N183 Chronic kidney disease, stage 3 unspecified: Secondary | ICD-10-CM | POA: Diagnosis not present

## 2021-01-17 DIAGNOSIS — M25551 Pain in right hip: Secondary | ICD-10-CM | POA: Diagnosis not present

## 2021-01-17 DIAGNOSIS — I129 Hypertensive chronic kidney disease with stage 1 through stage 4 chronic kidney disease, or unspecified chronic kidney disease: Secondary | ICD-10-CM | POA: Diagnosis not present

## 2021-01-17 DIAGNOSIS — J449 Chronic obstructive pulmonary disease, unspecified: Secondary | ICD-10-CM | POA: Diagnosis not present

## 2021-01-23 DIAGNOSIS — E1122 Type 2 diabetes mellitus with diabetic chronic kidney disease: Secondary | ICD-10-CM | POA: Diagnosis not present

## 2021-01-23 DIAGNOSIS — M25551 Pain in right hip: Secondary | ICD-10-CM | POA: Diagnosis not present

## 2021-01-23 DIAGNOSIS — G8929 Other chronic pain: Secondary | ICD-10-CM | POA: Diagnosis not present

## 2021-01-23 DIAGNOSIS — N183 Chronic kidney disease, stage 3 unspecified: Secondary | ICD-10-CM | POA: Diagnosis not present

## 2021-01-23 DIAGNOSIS — I129 Hypertensive chronic kidney disease with stage 1 through stage 4 chronic kidney disease, or unspecified chronic kidney disease: Secondary | ICD-10-CM | POA: Diagnosis not present

## 2021-01-23 DIAGNOSIS — J449 Chronic obstructive pulmonary disease, unspecified: Secondary | ICD-10-CM | POA: Diagnosis not present

## 2021-01-24 DIAGNOSIS — E559 Vitamin D deficiency, unspecified: Secondary | ICD-10-CM | POA: Diagnosis not present

## 2021-01-24 DIAGNOSIS — J479 Bronchiectasis, uncomplicated: Secondary | ICD-10-CM | POA: Diagnosis not present

## 2021-01-24 DIAGNOSIS — G4733 Obstructive sleep apnea (adult) (pediatric): Secondary | ICD-10-CM | POA: Diagnosis not present

## 2021-01-24 DIAGNOSIS — J309 Allergic rhinitis, unspecified: Secondary | ICD-10-CM | POA: Diagnosis not present

## 2021-01-24 DIAGNOSIS — J4520 Mild intermittent asthma, uncomplicated: Secondary | ICD-10-CM | POA: Diagnosis not present

## 2021-01-24 DIAGNOSIS — J8410 Pulmonary fibrosis, unspecified: Secondary | ICD-10-CM | POA: Diagnosis not present

## 2021-01-26 DIAGNOSIS — J449 Chronic obstructive pulmonary disease, unspecified: Secondary | ICD-10-CM | POA: Diagnosis not present

## 2021-01-26 DIAGNOSIS — M25551 Pain in right hip: Secondary | ICD-10-CM | POA: Diagnosis not present

## 2021-01-26 DIAGNOSIS — G8929 Other chronic pain: Secondary | ICD-10-CM | POA: Diagnosis not present

## 2021-01-26 DIAGNOSIS — I129 Hypertensive chronic kidney disease with stage 1 through stage 4 chronic kidney disease, or unspecified chronic kidney disease: Secondary | ICD-10-CM | POA: Diagnosis not present

## 2021-01-26 DIAGNOSIS — E1122 Type 2 diabetes mellitus with diabetic chronic kidney disease: Secondary | ICD-10-CM | POA: Diagnosis not present

## 2021-01-26 DIAGNOSIS — N183 Chronic kidney disease, stage 3 unspecified: Secondary | ICD-10-CM | POA: Diagnosis not present

## 2021-01-31 ENCOUNTER — Other Ambulatory Visit: Payer: Self-pay

## 2021-01-31 DIAGNOSIS — I129 Hypertensive chronic kidney disease with stage 1 through stage 4 chronic kidney disease, or unspecified chronic kidney disease: Secondary | ICD-10-CM | POA: Diagnosis not present

## 2021-01-31 DIAGNOSIS — E1122 Type 2 diabetes mellitus with diabetic chronic kidney disease: Secondary | ICD-10-CM | POA: Diagnosis not present

## 2021-01-31 DIAGNOSIS — M25551 Pain in right hip: Secondary | ICD-10-CM | POA: Diagnosis not present

## 2021-01-31 DIAGNOSIS — N183 Chronic kidney disease, stage 3 unspecified: Secondary | ICD-10-CM | POA: Diagnosis not present

## 2021-01-31 DIAGNOSIS — I480 Paroxysmal atrial fibrillation: Secondary | ICD-10-CM

## 2021-01-31 DIAGNOSIS — G8929 Other chronic pain: Secondary | ICD-10-CM | POA: Diagnosis not present

## 2021-01-31 DIAGNOSIS — J449 Chronic obstructive pulmonary disease, unspecified: Secondary | ICD-10-CM | POA: Diagnosis not present

## 2021-01-31 MED ORDER — APIXABAN 5 MG PO TABS: 5.0000 mg | ORAL_TABLET | Freq: Two times a day (BID) | ORAL | 0 refills | Status: DC

## 2021-01-31 NOTE — Telephone Encounter (Signed)
Prescription refill request for Eliquis received. Indication:Afib Last office visit:Needs Appointment Scr:0.9 Age: 87 Weight:81 kg  Prescription refilled

## 2021-01-31 NOTE — Telephone Encounter (Signed)
This is a [place] pt °

## 2021-02-01 DIAGNOSIS — I129 Hypertensive chronic kidney disease with stage 1 through stage 4 chronic kidney disease, or unspecified chronic kidney disease: Secondary | ICD-10-CM | POA: Diagnosis not present

## 2021-02-01 DIAGNOSIS — G8929 Other chronic pain: Secondary | ICD-10-CM | POA: Diagnosis not present

## 2021-02-01 DIAGNOSIS — N183 Chronic kidney disease, stage 3 unspecified: Secondary | ICD-10-CM | POA: Diagnosis not present

## 2021-02-01 DIAGNOSIS — M25551 Pain in right hip: Secondary | ICD-10-CM | POA: Diagnosis not present

## 2021-02-01 DIAGNOSIS — J449 Chronic obstructive pulmonary disease, unspecified: Secondary | ICD-10-CM | POA: Diagnosis not present

## 2021-02-01 DIAGNOSIS — E1122 Type 2 diabetes mellitus with diabetic chronic kidney disease: Secondary | ICD-10-CM | POA: Diagnosis not present

## 2021-02-07 DIAGNOSIS — E663 Overweight: Secondary | ICD-10-CM | POA: Diagnosis not present

## 2021-02-07 DIAGNOSIS — M5136 Other intervertebral disc degeneration, lumbar region: Secondary | ICD-10-CM | POA: Diagnosis not present

## 2021-02-07 DIAGNOSIS — M1611 Unilateral primary osteoarthritis, right hip: Secondary | ICD-10-CM | POA: Diagnosis not present

## 2021-02-07 DIAGNOSIS — G894 Chronic pain syndrome: Secondary | ICD-10-CM | POA: Diagnosis not present

## 2021-02-07 DIAGNOSIS — M25551 Pain in right hip: Secondary | ICD-10-CM | POA: Diagnosis not present

## 2021-02-07 DIAGNOSIS — M47816 Spondylosis without myelopathy or radiculopathy, lumbar region: Secondary | ICD-10-CM | POA: Diagnosis not present

## 2021-02-07 DIAGNOSIS — Z79891 Long term (current) use of opiate analgesic: Secondary | ICD-10-CM | POA: Diagnosis not present

## 2021-02-07 DIAGNOSIS — Z1389 Encounter for screening for other disorder: Secondary | ICD-10-CM | POA: Diagnosis not present

## 2021-02-08 DIAGNOSIS — I129 Hypertensive chronic kidney disease with stage 1 through stage 4 chronic kidney disease, or unspecified chronic kidney disease: Secondary | ICD-10-CM | POA: Diagnosis not present

## 2021-02-08 DIAGNOSIS — J449 Chronic obstructive pulmonary disease, unspecified: Secondary | ICD-10-CM | POA: Diagnosis not present

## 2021-02-08 DIAGNOSIS — N183 Chronic kidney disease, stage 3 unspecified: Secondary | ICD-10-CM | POA: Diagnosis not present

## 2021-02-08 DIAGNOSIS — E1122 Type 2 diabetes mellitus with diabetic chronic kidney disease: Secondary | ICD-10-CM | POA: Diagnosis not present

## 2021-02-08 DIAGNOSIS — M25551 Pain in right hip: Secondary | ICD-10-CM | POA: Diagnosis not present

## 2021-02-08 DIAGNOSIS — G8929 Other chronic pain: Secondary | ICD-10-CM | POA: Diagnosis not present

## 2021-02-09 DIAGNOSIS — I129 Hypertensive chronic kidney disease with stage 1 through stage 4 chronic kidney disease, or unspecified chronic kidney disease: Secondary | ICD-10-CM | POA: Diagnosis not present

## 2021-02-09 DIAGNOSIS — J449 Chronic obstructive pulmonary disease, unspecified: Secondary | ICD-10-CM | POA: Diagnosis not present

## 2021-02-09 DIAGNOSIS — N183 Chronic kidney disease, stage 3 unspecified: Secondary | ICD-10-CM | POA: Diagnosis not present

## 2021-02-09 DIAGNOSIS — G8929 Other chronic pain: Secondary | ICD-10-CM | POA: Diagnosis not present

## 2021-02-09 DIAGNOSIS — M25551 Pain in right hip: Secondary | ICD-10-CM | POA: Diagnosis not present

## 2021-02-09 DIAGNOSIS — E1122 Type 2 diabetes mellitus with diabetic chronic kidney disease: Secondary | ICD-10-CM | POA: Diagnosis not present

## 2021-02-14 ENCOUNTER — Other Ambulatory Visit: Payer: Self-pay | Admitting: Cardiology

## 2021-02-14 DIAGNOSIS — E1151 Type 2 diabetes mellitus with diabetic peripheral angiopathy without gangrene: Secondary | ICD-10-CM | POA: Diagnosis not present

## 2021-02-14 DIAGNOSIS — I480 Paroxysmal atrial fibrillation: Secondary | ICD-10-CM

## 2021-02-14 DIAGNOSIS — E039 Hypothyroidism, unspecified: Secondary | ICD-10-CM | POA: Diagnosis not present

## 2021-02-14 DIAGNOSIS — M47816 Spondylosis without myelopathy or radiculopathy, lumbar region: Secondary | ICD-10-CM | POA: Diagnosis not present

## 2021-02-14 DIAGNOSIS — M19012 Primary osteoarthritis, left shoulder: Secondary | ICD-10-CM | POA: Diagnosis not present

## 2021-02-14 DIAGNOSIS — S0990XA Unspecified injury of head, initial encounter: Secondary | ICD-10-CM | POA: Diagnosis not present

## 2021-02-14 DIAGNOSIS — E161 Other hypoglycemia: Secondary | ICD-10-CM | POA: Diagnosis not present

## 2021-02-14 DIAGNOSIS — M25551 Pain in right hip: Secondary | ICD-10-CM | POA: Diagnosis not present

## 2021-02-14 DIAGNOSIS — E11649 Type 2 diabetes mellitus with hypoglycemia without coma: Secondary | ICD-10-CM | POA: Diagnosis not present

## 2021-02-14 DIAGNOSIS — E162 Hypoglycemia, unspecified: Secondary | ICD-10-CM | POA: Diagnosis not present

## 2021-02-14 DIAGNOSIS — S199XXA Unspecified injury of neck, initial encounter: Secondary | ICD-10-CM | POA: Diagnosis not present

## 2021-02-14 DIAGNOSIS — M8580 Other specified disorders of bone density and structure, unspecified site: Secondary | ICD-10-CM | POA: Diagnosis not present

## 2021-02-14 DIAGNOSIS — J9611 Chronic respiratory failure with hypoxia: Secondary | ICD-10-CM | POA: Diagnosis not present

## 2021-02-14 DIAGNOSIS — S4992XA Unspecified injury of left shoulder and upper arm, initial encounter: Secondary | ICD-10-CM | POA: Diagnosis not present

## 2021-02-14 DIAGNOSIS — M1611 Unilateral primary osteoarthritis, right hip: Secondary | ICD-10-CM | POA: Diagnosis not present

## 2021-02-14 DIAGNOSIS — R9089 Other abnormal findings on diagnostic imaging of central nervous system: Secondary | ICD-10-CM | POA: Diagnosis not present

## 2021-02-14 DIAGNOSIS — I10 Essential (primary) hypertension: Secondary | ICD-10-CM | POA: Diagnosis not present

## 2021-02-14 DIAGNOSIS — F419 Anxiety disorder, unspecified: Secondary | ICD-10-CM | POA: Diagnosis not present

## 2021-02-14 DIAGNOSIS — I482 Chronic atrial fibrillation, unspecified: Secondary | ICD-10-CM | POA: Diagnosis not present

## 2021-02-14 DIAGNOSIS — M25559 Pain in unspecified hip: Secondary | ICD-10-CM | POA: Diagnosis not present

## 2021-02-14 DIAGNOSIS — I129 Hypertensive chronic kidney disease with stage 1 through stage 4 chronic kidney disease, or unspecified chronic kidney disease: Secondary | ICD-10-CM | POA: Diagnosis not present

## 2021-02-14 DIAGNOSIS — F32A Depression, unspecified: Secondary | ICD-10-CM | POA: Diagnosis not present

## 2021-02-14 DIAGNOSIS — R404 Transient alteration of awareness: Secondary | ICD-10-CM | POA: Diagnosis not present

## 2021-02-14 DIAGNOSIS — R0902 Hypoxemia: Secondary | ICD-10-CM | POA: Diagnosis not present

## 2021-02-14 DIAGNOSIS — N390 Urinary tract infection, site not specified: Secondary | ICD-10-CM | POA: Diagnosis not present

## 2021-02-15 DIAGNOSIS — N390 Urinary tract infection, site not specified: Secondary | ICD-10-CM | POA: Diagnosis not present

## 2021-02-15 DIAGNOSIS — I482 Chronic atrial fibrillation, unspecified: Secondary | ICD-10-CM | POA: Diagnosis not present

## 2021-02-15 DIAGNOSIS — Z7984 Long term (current) use of oral hypoglycemic drugs: Secondary | ICD-10-CM | POA: Diagnosis not present

## 2021-02-15 DIAGNOSIS — Z87891 Personal history of nicotine dependence: Secondary | ICD-10-CM | POA: Diagnosis not present

## 2021-02-15 DIAGNOSIS — M25551 Pain in right hip: Secondary | ICD-10-CM | POA: Diagnosis not present

## 2021-02-15 DIAGNOSIS — E1151 Type 2 diabetes mellitus with diabetic peripheral angiopathy without gangrene: Secondary | ICD-10-CM | POA: Diagnosis present

## 2021-02-15 DIAGNOSIS — E039 Hypothyroidism, unspecified: Secondary | ICD-10-CM | POA: Diagnosis present

## 2021-02-15 DIAGNOSIS — N183 Chronic kidney disease, stage 3 unspecified: Secondary | ICD-10-CM | POA: Diagnosis present

## 2021-02-15 DIAGNOSIS — I252 Old myocardial infarction: Secondary | ICD-10-CM | POA: Diagnosis not present

## 2021-02-15 DIAGNOSIS — R55 Syncope and collapse: Secondary | ICD-10-CM | POA: Diagnosis present

## 2021-02-15 DIAGNOSIS — E11649 Type 2 diabetes mellitus with hypoglycemia without coma: Secondary | ICD-10-CM | POA: Diagnosis present

## 2021-02-15 DIAGNOSIS — I129 Hypertensive chronic kidney disease with stage 1 through stage 4 chronic kidney disease, or unspecified chronic kidney disease: Secondary | ICD-10-CM | POA: Diagnosis present

## 2021-02-15 DIAGNOSIS — Z7901 Long term (current) use of anticoagulants: Secondary | ICD-10-CM | POA: Diagnosis not present

## 2021-02-15 DIAGNOSIS — E1122 Type 2 diabetes mellitus with diabetic chronic kidney disease: Secondary | ICD-10-CM | POA: Diagnosis present

## 2021-02-15 DIAGNOSIS — E162 Hypoglycemia, unspecified: Secondary | ICD-10-CM | POA: Diagnosis not present

## 2021-02-15 DIAGNOSIS — I6529 Occlusion and stenosis of unspecified carotid artery: Secondary | ICD-10-CM | POA: Diagnosis not present

## 2021-02-15 DIAGNOSIS — E119 Type 2 diabetes mellitus without complications: Secondary | ICD-10-CM | POA: Diagnosis not present

## 2021-02-15 DIAGNOSIS — F419 Anxiety disorder, unspecified: Secondary | ICD-10-CM | POA: Diagnosis not present

## 2021-02-15 DIAGNOSIS — J9611 Chronic respiratory failure with hypoxia: Secondary | ICD-10-CM | POA: Diagnosis not present

## 2021-02-15 DIAGNOSIS — Z9981 Dependence on supplemental oxygen: Secondary | ICD-10-CM | POA: Diagnosis not present

## 2021-02-15 DIAGNOSIS — F32A Depression, unspecified: Secondary | ICD-10-CM | POA: Diagnosis present

## 2021-02-15 DIAGNOSIS — I10 Essential (primary) hypertension: Secondary | ICD-10-CM | POA: Diagnosis not present

## 2021-02-15 DIAGNOSIS — Z794 Long term (current) use of insulin: Secondary | ICD-10-CM | POA: Diagnosis not present

## 2021-02-15 NOTE — Telephone Encounter (Signed)
Prescription refill request for Eliquis received. Indication:Afib Last office visit:Needs Appointment Scr:0.8 Age: 87 Weight:81 kg  Prescription refilled

## 2021-02-20 DIAGNOSIS — J309 Allergic rhinitis, unspecified: Secondary | ICD-10-CM | POA: Diagnosis not present

## 2021-02-20 DIAGNOSIS — J479 Bronchiectasis, uncomplicated: Secondary | ICD-10-CM | POA: Diagnosis not present

## 2021-02-20 DIAGNOSIS — J4520 Mild intermittent asthma, uncomplicated: Secondary | ICD-10-CM | POA: Diagnosis not present

## 2021-02-20 DIAGNOSIS — J8410 Pulmonary fibrosis, unspecified: Secondary | ICD-10-CM | POA: Diagnosis not present

## 2021-02-20 DIAGNOSIS — G4733 Obstructive sleep apnea (adult) (pediatric): Secondary | ICD-10-CM | POA: Diagnosis not present

## 2021-02-20 DIAGNOSIS — E559 Vitamin D deficiency, unspecified: Secondary | ICD-10-CM | POA: Diagnosis not present

## 2021-04-11 ENCOUNTER — Telehealth: Payer: Self-pay | Admitting: Cardiology

## 2021-04-11 DIAGNOSIS — I480 Paroxysmal atrial fibrillation: Secondary | ICD-10-CM

## 2021-04-11 MED ORDER — ISOSORBIDE MONONITRATE ER 30 MG PO TB24: 30.0000 mg | ORAL_TABLET | Freq: Every day | ORAL | 3 refills | Status: AC

## 2021-04-11 MED ORDER — APIXABAN 5 MG PO TABS
ORAL_TABLET | ORAL | 3 refills | Status: AC
Start: 2021-04-11 — End: ?

## 2021-04-11 NOTE — Telephone Encounter (Signed)
Refill sent in per request.  

## 2021-04-11 NOTE — Telephone Encounter (Signed)
°*  STAT* If patient is at the pharmacy, call can be transferred to refill team.   1. Which medications need to be refilled? (please list name of each medication and dose if known)  apixaban (ELIQUIS) 5 MG TABS tablet isosorbide mononitrate (IMDUR) 30 MG 24 hr tablet  2. Which pharmacy/location (including street and city if local pharmacy) is medication to be sent to? [facility] [place], [place]  3. Do they need a 30 day or 90 day supply? 90 with refills  Patient is scheduled to see Dr. Agustin Cree 05/29/21

## 2021-04-26 ENCOUNTER — Ambulatory Visit: Payer: Medicare Other | Admitting: Podiatry

## 2021-05-10 ENCOUNTER — Encounter: Payer: Self-pay | Admitting: Podiatry

## 2021-05-10 ENCOUNTER — Other Ambulatory Visit: Payer: Self-pay

## 2021-05-10 ENCOUNTER — Ambulatory Visit (INDEPENDENT_AMBULATORY_CARE_PROVIDER_SITE_OTHER): Payer: Medicare Other | Admitting: Podiatry

## 2021-05-10 DIAGNOSIS — M79675 Pain in left toe(s): Secondary | ICD-10-CM

## 2021-05-10 DIAGNOSIS — B351 Tinea unguium: Secondary | ICD-10-CM

## 2021-05-10 DIAGNOSIS — L84 Corns and callosities: Secondary | ICD-10-CM

## 2021-05-10 DIAGNOSIS — E1142 Type 2 diabetes mellitus with diabetic polyneuropathy: Secondary | ICD-10-CM | POA: Diagnosis not present

## 2021-05-10 DIAGNOSIS — M79674 Pain in right toe(s): Secondary | ICD-10-CM | POA: Diagnosis not present

## 2021-05-19 NOTE — Progress Notes (Signed)
?  Subjective:  ?Patient ID: Shelia [place], female    DOB: 1931-07-27,  MRN: [id number] ? ?Shelia Sullivan presents to clinic today for at risk foot care with history of diabetic neuropathy and corn(s) R 3rd toe and painful thick toenails that are difficult to trim. Painful toenails interfere with ambulation. Aggravating factors include wearing enclosed shoe gear. Pain is relieved with periodic professional debridement. Painful corns are aggravated when weightbearing when wearing enclosed shoe gear. Pain is relieved with periodic professional debridement. ? ?Patient states blood glucose was low 100's mg/dl today.  Last HgA1c: patient is unsure. ? ?New problem(s): None.  ? ?PCP is [name], NP , and last visit was February 09, 2021. ? ?Allergies  ?Allergen Reactions  ? Aspirin Other (See Comments)  ? Codeine   ? Hydrocodone   ? Nsaids Other (See Comments)  ?  GI Bleeding  ? Oxycodone   ? Sulfa Antibiotics   ? Tolmetin Other (See Comments)  ?  GI Bleeding  ? ? ?Review of Systems: Negative except as noted in the HPI. ? ?Objective: No changes noted in today's physical examination. ?Shelia [place] is a pleasant 88 y.o. female in NAD. AAO X 3. ? ?Vascular Examination: ?CFT <5 seconds b/l LE. Faintly palpable DP pulse(s) b/l lower extremities. Faintly palpable PT pulse(s) b/l lower extremities. Pedal hair absent. No pain with calf compression b/l. Lower extremity skin temperature gradient within normal limits. No edema noted b/l LE. ? ?Dermatological Examination: ?Skin warm and supple b/l lower extremities. No open wounds b/l LE. No interdigital macerations noted b/l LE. Toenails 1-5 b/l elongated, discolored, dystrophic, thickened, crumbly with subungual debris and tenderness to dorsal palpation. Hyperkeratotic lesion(s) R 3rd toe.  No erythema, no edema, no drainage, no fluctuance. ? ?Musculoskeletal Examination: ?Normal muscle strength 5/5 to all lower extremity muscle groups bilaterally. Hammertoe(s) noted  to the R 3rd toe. ? ?Neurological Examination: ?Protective sensation diminished with 10g monofilament b/l. Vibratory sensation diminished b/l. ? ?Assessment/Plan: ?1. Pain due to onychomycosis of toenails of both feet   ?2. Corns   ?3. Diabetic polyneuropathy associated with type 2 diabetes mellitus ([place])   ?-Patient was evaluated and treated. All patient's and/or POA's questions/concerns answered on today's visit. ?-Mycotic toenails 1-5 bilaterally were debrided in length and girth with sterile nail nippers and dremel without incident. ?-Corn(s) R 3rd toe pared utilizing sterile scalpel blade without complication or incident. Total number debrided=1. ?-Continue toe crest to afftected digits daily for protection. ?-Patient/POA to call should there be question/concern in the interim.  ? ?Return in about 3 months (around 08/10/2021). ? ?Marzetta Board, DPM  ?

## 2021-05-25 DIAGNOSIS — E611 Iron deficiency: Secondary | ICD-10-CM | POA: Insufficient documentation

## 2021-05-25 DIAGNOSIS — A09 Infectious gastroenteritis and colitis, unspecified: Secondary | ICD-10-CM | POA: Insufficient documentation

## 2021-05-25 DIAGNOSIS — R1032 Left lower quadrant pain: Secondary | ICD-10-CM | POA: Insufficient documentation

## 2021-05-25 DIAGNOSIS — Z7901 Long term (current) use of anticoagulants: Secondary | ICD-10-CM | POA: Insufficient documentation

## 2021-05-25 DIAGNOSIS — K5909 Other constipation: Secondary | ICD-10-CM | POA: Insufficient documentation

## 2021-05-25 DIAGNOSIS — D7589 Other specified diseases of blood and blood-forming organs: Secondary | ICD-10-CM | POA: Insufficient documentation

## 2021-05-25 DIAGNOSIS — K838 Other specified diseases of biliary tract: Secondary | ICD-10-CM | POA: Insufficient documentation

## 2021-05-25 DIAGNOSIS — Z9181 History of falling: Secondary | ICD-10-CM | POA: Insufficient documentation

## 2021-05-25 DIAGNOSIS — Z87440 Personal history of urinary (tract) infections: Secondary | ICD-10-CM | POA: Insufficient documentation

## 2021-05-29 ENCOUNTER — Telehealth: Payer: Self-pay | Admitting: Cardiology

## 2021-05-29 ENCOUNTER — Ambulatory Visit: Payer: Medicare Other | Admitting: Cardiology

## 2021-05-29 MED ORDER — RANOLAZINE ER 500 MG PO TB12: 500.0000 mg | ORAL_TABLET | Freq: Two times a day (BID) | ORAL | 0 refills | Status: AC

## 2021-05-29 NOTE — Telephone Encounter (Signed)
Refill of Ranexa 500 mg sent to [facility]. ?

## 2021-05-29 NOTE — Telephone Encounter (Signed)
?*  STAT* If patient is at the pharmacy, call can be transferred to refill team. ? ? ?1. Which medications need to be refilled? (please list name of each medication and dose if known) ranolazine (RANEXA) 500 MG 12 hr tablet ? ?2. Which pharmacy/location (including street and city if local pharmacy) is medication to be sent to? [facility] [place], [place] ? ?3. Do they need a 30 day or 90 day supply? 90 day ? ? ?Patient has an appointment 6/29. Appointment had to be pushed back due to the patient being hospitalized. ?

## 2021-07-12 ENCOUNTER — Ambulatory Visit (INDEPENDENT_AMBULATORY_CARE_PROVIDER_SITE_OTHER): Payer: Medicare Other | Admitting: Podiatry

## 2021-07-12 ENCOUNTER — Encounter: Payer: Self-pay | Admitting: Podiatry

## 2021-07-12 DIAGNOSIS — M79674 Pain in right toe(s): Secondary | ICD-10-CM | POA: Diagnosis not present

## 2021-07-12 DIAGNOSIS — L84 Corns and callosities: Secondary | ICD-10-CM | POA: Diagnosis not present

## 2021-07-12 DIAGNOSIS — E1142 Type 2 diabetes mellitus with diabetic polyneuropathy: Secondary | ICD-10-CM

## 2021-07-12 DIAGNOSIS — M79675 Pain in left toe(s): Secondary | ICD-10-CM | POA: Diagnosis not present

## 2021-07-12 DIAGNOSIS — B351 Tinea unguium: Secondary | ICD-10-CM

## 2021-07-20 NOTE — Progress Notes (Signed)
  Subjective:  Patient ID: Shelia [place], female    DOB: 1931-05-03,  MRN: [id number]  CAELEY DOHRMANN presents to clinic today for at risk foot care with history of diabetic neuropathy and corn(s) b/l lower extremities and painful thick toenails that are difficult to trim. Painful toenails interfere with ambulation. Aggravating factors include wearing enclosed shoe gear. Pain is relieved with periodic professional debridement. Painful corns are aggravated when weightbearing when wearing enclosed shoe gear. Pain is relieved with periodic professional debridement.  Patient states blood glucose was 150 mg/dl today.  Last known HgA1c was unknown.  New problem(s): None.   PCP is [name], NP , and last visit was February 09, 2021.  Allergies  Allergen Reactions   Aspirin Other (See Comments)   Celecoxib    Codeine    Hydrocodone    Nsaids Other (See Comments)    GI Bleeding   Oxycodone    Red Dye    Sulfa Antibiotics    Tolmetin Other (See Comments)    GI Bleeding    Review of Systems: Negative except as noted in the HPI.  Objective:  Shelia Sullivan is a pleasant 88 y.o. female in NAD. AAO X 3.  Vascular Examination: CFT <5 seconds b/l LE. Faintly palpable DP pulse(s) b/l lower extremities. Faintly palpable PT pulse(s) b/l lower extremities. Pedal hair absent. No pain with calf compression b/l. Lower extremity skin temperature gradient within normal limits. No edema noted b/l LE.  Dermatological Examination: Skin warm and supple b/l lower extremities. No open wounds b/l LE. No interdigital macerations noted b/l LE. Toenails 1-5 b/l elongated, discolored, dystrophic, thickened, crumbly with subungual debris and tenderness to dorsal palpation. Hyperkeratotic lesion(s) bilateral 3rd toes and left 4th toe.  No erythema, no edema, no drainage, no fluctuance.  Musculoskeletal Examination: Normal muscle strength 5/5 to all lower extremity muscle groups bilaterally.  Adductovarus digits 3-5 b/l. Patient utilizes walker for gait assistance.  Neurological Examination: Protective sensation diminished with 10g monofilament b/l. Vibratory sensation diminished b/l.  Assessment/Plan: 1. Pain due to onychomycosis of toenails of both feet   2. Corns   3. Diabetic polyneuropathy associated with type 2 diabetes mellitus ([place])     -Patient was evaluated and treated. All patient's and/or POA's questions/concerns answered on today's visit. -Continue diabetic foot care principles: inspect feet daily, monitor glucose as recommended by PCP and/or Endocrinologist, and follow prescribed diet per PCP, Endocrinologist and/or dietician. -Patient to continue soft, supportive shoe gear daily. -Toenails 1-5 b/l were debrided in length and girth with sterile nail nippers and dremel without iatrogenic bleeding.  -Corn(s) bilateral 3rd toes and L 4th toe pared utilizing sterile scalpel blade without complication or incident. Total number debrided=3. -Patient/POA to call should there be question/concern in the interim.   Return in about 9 weeks (around 09/13/2021).  Marzetta Board, DPM

## 2021-08-23 ENCOUNTER — Ambulatory Visit: Payer: Medicare Other | Admitting: Cardiology

## 2021-09-13 ENCOUNTER — Ambulatory Visit: Payer: Medicare Other | Admitting: Podiatry

## 2021-11-22 ENCOUNTER — Ambulatory Visit: Payer: Medicare Other | Admitting: Podiatry

## 2021-11-22 DIAGNOSIS — Z91199 Patient's noncompliance with other medical treatment and regimen due to unspecified reason: Secondary | ICD-10-CM

## 2021-11-23 NOTE — Progress Notes (Signed)
1. No-show for appointment
